# Patient Record
Sex: Female | Born: 1940 | Race: Black or African American | Hispanic: No | Smoking: Former smoker
Health system: Southern US, Community
[De-identification: ages and names within clinical notes are randomized; demographics above are authoritative.]

## PROBLEM LIST (undated history)

## (undated) DIAGNOSIS — C801 Malignant (primary) neoplasm, unspecified: Secondary | ICD-10-CM

## (undated) DIAGNOSIS — F32A Depression, unspecified: Secondary | ICD-10-CM

## (undated) DIAGNOSIS — R7303 Prediabetes: Secondary | ICD-10-CM

## (undated) DIAGNOSIS — K219 Gastro-esophageal reflux disease without esophagitis: Secondary | ICD-10-CM

## (undated) DIAGNOSIS — R0600 Dyspnea, unspecified: Secondary | ICD-10-CM

## (undated) DIAGNOSIS — M1990 Unspecified osteoarthritis, unspecified site: Secondary | ICD-10-CM

## (undated) DIAGNOSIS — C50919 Malignant neoplasm of unspecified site of unspecified female breast: Secondary | ICD-10-CM

## (undated) DIAGNOSIS — F419 Anxiety disorder, unspecified: Secondary | ICD-10-CM

## (undated) HISTORY — PX: ABDOMINAL HYSTERECTOMY: SHX81

## (undated) HISTORY — PX: APPENDECTOMY: SHX54

## (undated) HISTORY — PX: TONSILLECTOMY: SUR1361

---

## 1992-06-08 DIAGNOSIS — C801 Malignant (primary) neoplasm, unspecified: Secondary | ICD-10-CM

## 1992-06-08 HISTORY — DX: Malignant (primary) neoplasm, unspecified: C80.1

## 1995-06-09 HISTORY — PX: MASTECTOMY: SHX3

## 1997-10-16 ENCOUNTER — Other Ambulatory Visit: Admission: RE | Admit: 1997-10-16 | Discharge: 1997-10-16 | Payer: Self-pay | Admitting: Obstetrics and Gynecology

## 1998-10-22 ENCOUNTER — Other Ambulatory Visit: Admission: RE | Admit: 1998-10-22 | Discharge: 1998-10-22 | Payer: Self-pay | Admitting: Obstetrics and Gynecology

## 1999-08-26 ENCOUNTER — Encounter: Payer: Self-pay | Admitting: Obstetrics and Gynecology

## 1999-08-26 ENCOUNTER — Encounter: Admission: RE | Admit: 1999-08-26 | Discharge: 1999-08-26 | Payer: Self-pay | Admitting: Obstetrics and Gynecology

## 1999-10-14 ENCOUNTER — Other Ambulatory Visit: Admission: RE | Admit: 1999-10-14 | Discharge: 1999-10-14 | Payer: Self-pay | Admitting: Family Medicine

## 2000-08-31 ENCOUNTER — Encounter: Admission: RE | Admit: 2000-08-31 | Discharge: 2000-08-31 | Payer: Self-pay | Admitting: Family Medicine

## 2000-08-31 ENCOUNTER — Encounter: Payer: Self-pay | Admitting: Family Medicine

## 2000-11-09 ENCOUNTER — Other Ambulatory Visit: Admission: RE | Admit: 2000-11-09 | Discharge: 2000-11-09 | Payer: Self-pay | Admitting: *Deleted

## 2001-06-13 ENCOUNTER — Encounter: Payer: Self-pay | Admitting: *Deleted

## 2001-06-13 ENCOUNTER — Ambulatory Visit (HOSPITAL_COMMUNITY): Admission: RE | Admit: 2001-06-13 | Discharge: 2001-06-13 | Payer: Self-pay | Admitting: *Deleted

## 2001-06-21 ENCOUNTER — Encounter (INDEPENDENT_AMBULATORY_CARE_PROVIDER_SITE_OTHER): Payer: Self-pay | Admitting: *Deleted

## 2001-06-21 ENCOUNTER — Ambulatory Visit (HOSPITAL_COMMUNITY): Admission: RE | Admit: 2001-06-21 | Discharge: 2001-06-21 | Payer: Self-pay | Admitting: Gastroenterology

## 2001-09-06 ENCOUNTER — Encounter: Payer: Self-pay | Admitting: Family Medicine

## 2001-09-06 ENCOUNTER — Encounter: Admission: RE | Admit: 2001-09-06 | Discharge: 2001-09-06 | Payer: Self-pay | Admitting: Family Medicine

## 2002-02-07 ENCOUNTER — Other Ambulatory Visit: Admission: RE | Admit: 2002-02-07 | Discharge: 2002-02-07 | Payer: Self-pay | Admitting: Family Medicine

## 2002-09-12 ENCOUNTER — Encounter: Payer: Self-pay | Admitting: Family Medicine

## 2002-09-12 ENCOUNTER — Encounter: Admission: RE | Admit: 2002-09-12 | Discharge: 2002-09-12 | Payer: Self-pay | Admitting: Family Medicine

## 2003-02-13 ENCOUNTER — Other Ambulatory Visit: Admission: RE | Admit: 2003-02-13 | Discharge: 2003-02-13 | Payer: Self-pay | Admitting: Family Medicine

## 2003-09-18 ENCOUNTER — Encounter: Admission: RE | Admit: 2003-09-18 | Discharge: 2003-09-18 | Payer: Self-pay | Admitting: Family Medicine

## 2004-02-26 ENCOUNTER — Other Ambulatory Visit: Admission: RE | Admit: 2004-02-26 | Discharge: 2004-02-26 | Payer: Self-pay | Admitting: Family Medicine

## 2004-05-06 ENCOUNTER — Ambulatory Visit (HOSPITAL_COMMUNITY): Admission: RE | Admit: 2004-05-06 | Discharge: 2004-05-06 | Payer: Self-pay | Admitting: Family Medicine

## 2004-06-13 ENCOUNTER — Ambulatory Visit (HOSPITAL_COMMUNITY): Admission: RE | Admit: 2004-06-13 | Discharge: 2004-06-13 | Payer: Self-pay | Admitting: Gastroenterology

## 2004-09-23 ENCOUNTER — Encounter: Admission: RE | Admit: 2004-09-23 | Discharge: 2004-09-23 | Payer: Self-pay | Admitting: Family Medicine

## 2005-03-04 ENCOUNTER — Other Ambulatory Visit: Admission: RE | Admit: 2005-03-04 | Discharge: 2005-03-04 | Payer: Self-pay | Admitting: Family Medicine

## 2005-09-29 ENCOUNTER — Encounter: Admission: RE | Admit: 2005-09-29 | Discharge: 2005-09-29 | Payer: Self-pay | Admitting: Family Medicine

## 2005-10-01 ENCOUNTER — Encounter: Admission: RE | Admit: 2005-10-01 | Discharge: 2005-10-01 | Payer: Self-pay | Admitting: Family Medicine

## 2006-03-16 ENCOUNTER — Other Ambulatory Visit: Admission: RE | Admit: 2006-03-16 | Discharge: 2006-03-16 | Payer: Self-pay | Admitting: Family Medicine

## 2006-08-20 ENCOUNTER — Ambulatory Visit (HOSPITAL_COMMUNITY): Admission: RE | Admit: 2006-08-20 | Discharge: 2006-08-21 | Payer: Self-pay | Admitting: Surgery

## 2006-08-20 ENCOUNTER — Encounter (INDEPENDENT_AMBULATORY_CARE_PROVIDER_SITE_OTHER): Payer: Self-pay | Admitting: Specialist

## 2006-10-05 ENCOUNTER — Encounter: Admission: RE | Admit: 2006-10-05 | Discharge: 2006-10-05 | Payer: Self-pay | Admitting: Dermatology

## 2006-11-03 ENCOUNTER — Encounter (INDEPENDENT_AMBULATORY_CARE_PROVIDER_SITE_OTHER): Payer: Self-pay | Admitting: Gastroenterology

## 2006-11-03 ENCOUNTER — Ambulatory Visit (HOSPITAL_COMMUNITY): Admission: RE | Admit: 2006-11-03 | Discharge: 2006-11-03 | Payer: Self-pay | Admitting: Gastroenterology

## 2007-10-11 ENCOUNTER — Encounter: Admission: RE | Admit: 2007-10-11 | Discharge: 2007-10-11 | Payer: Self-pay | Admitting: Family Medicine

## 2008-03-28 ENCOUNTER — Emergency Department (HOSPITAL_COMMUNITY): Admission: EM | Admit: 2008-03-28 | Discharge: 2008-03-29 | Payer: Self-pay | Admitting: Emergency Medicine

## 2008-10-12 ENCOUNTER — Encounter: Admission: RE | Admit: 2008-10-12 | Discharge: 2008-10-12 | Payer: Self-pay | Admitting: Family Medicine

## 2009-01-14 ENCOUNTER — Encounter: Admission: RE | Admit: 2009-01-14 | Discharge: 2009-01-14 | Payer: Self-pay | Admitting: Family Medicine

## 2009-04-09 ENCOUNTER — Encounter: Admission: RE | Admit: 2009-04-09 | Discharge: 2009-04-09 | Payer: Self-pay | Admitting: Family Medicine

## 2009-10-15 ENCOUNTER — Encounter: Admission: RE | Admit: 2009-10-15 | Discharge: 2009-10-15 | Payer: Self-pay | Admitting: Family Medicine

## 2010-03-26 ENCOUNTER — Other Ambulatory Visit: Admission: RE | Admit: 2010-03-26 | Discharge: 2010-03-26 | Payer: Self-pay | Admitting: Obstetrics and Gynecology

## 2010-06-27 ENCOUNTER — Ambulatory Visit
Admission: RE | Admit: 2010-06-27 | Discharge: 2010-06-27 | Payer: Self-pay | Source: Home / Self Care | Attending: Orthopedic Surgery | Admitting: Orthopedic Surgery

## 2010-06-30 LAB — POCT HEMOGLOBIN-HEMACUE
Hemoglobin: 12.7 g/dL (ref 12.0–15.0)
Hemoglobin: 14.9 g/dL (ref 12.0–15.0)

## 2010-07-04 NOTE — Op Note (Signed)
[name], Kramer              ACCOUNT NO.:  [id number]  MEDICAL RECORD NO.:  [id number]          PATIENT TYPE:  AMB  LOCATION:  DSC                          FACILITY:  [facility]  PHYSICIAN:  Alexis Kramer. Alexis Kramer, M.D. DATE OF BIRTH:  Oct 31, 1940  DATE OF PROCEDURE:  06/27/2010 DATE OF DISCHARGE:                              OPERATIVE REPORT   PREOPERATIVE DIAGNOSIS:  Entrapment neuropathy, median nerve, right carpal tunnel, severe.  POSTOPERATIVE DIAGNOSIS:  Entrapment neuropathy, median nerve, right carpal tunnel, severe.  OPERATIONS:  Release of right transverse carpal ligament.  OPERATIONS:  Alexis Kramer. Alexis Hocevar, MD  ASSISTANT:  Alexis Reeks Dasnoit, PA-C  ANESTHESIA:  General by LMA.  SUPERVISING ANESTHESIOLOGIST:  Alexis Person, MD  INDICATIONS:  Alexis Kramer is a 70 year old woman referred through the courtesy of Dr. Laurann Kramer for evaluation and management of hand numbness.  She had been previously evaluated by Dr. Metro Kramer and had been diagnosed with bilateral carpal tunnel syndrome.  She has had conservative care for her carpal tunnel syndrome without relief.  She has had steroid injections and splinting.  Dr. Naaman Kramer of Alexis Kramer completed detailed electrodiagnostic studies in October 2010 revealing a very significant right carpal tunnel syndrome.  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  This is not likely an etiology of her carpal tunnel syndrome.  Due to failure to respond to nonoperative measures, she presented on November 2, Kramer, requesting care for her carpal tunnel syndrome.  We reviewed the records of Alexis Kramer including Dr. Elberta Kramer detailed electrodiagnostic studies.  These revealed severe right carpal tunnel syndrome.  We provided informed consent from Alexis Kramer including the observation that it would take months for her to have optimum recover sensibility in her hand, perhaps 5-6 months.  After  informed consent, she is brought to the operating room at this time.  Preoperatively, she was interviewed by Dr. Gypsy Kramer.  Due to history of prior left breast cancer treated in 1997, she was very apprehensive about IV access on the left arm despite the absence of baseline lymphedema.  Dr. Gypsy Kramer elected to place an IV at the antecubital level of the right brachium.  We will use an Esmarch bandage as an alternative to a pneumatic tourniquet.  After informed consent, Alexis Kramer is brought to the operating room at this time.  PROCEDURE:  Alexis Kramer was brought to room #1 of the Alexis Kramer and placed in supine position upon the operating table. Following the induction of general anesthesia by LMA technique under Dr. Burnett Kramer direct supervision, the right arm was prepped with Betadine soap and solution, sterilely draped.  After a routine surgical time-out, the arm was exsanguinated with an Esmarch bandage that was left in the proximal forearm as a tourniquet.  Procedure commenced with a short incision in line of the ring finger and the palm.  Subcutaneous tissues were carefully divided revealing the palmar fascia.  This was split longitudinally to reveal the common sensory branch of the median nerve.  These were followed back to the transverse carpal ligament followed by use of a Alexis Kramer #4 elevator to separate the median  nerve and tenosynovium of the ulnar bursa from the deep surface of the transcarpal ligament.  The ligament was then released subcutaneously with scissors extending into the distal forearm. This widely opened the carpal canal.  There was no evidence of hemangioma extending into the carpal canal.  The tourniquet was released and bleeding was controlled by bipolar cautery and direct pressure.  The wound was repaired with intradermal 3-0 Prolene suture.  Compressive dressing was supplied with a volar plaster splint maintaining the wrist in 5 degrees of  dorsiflexion.  There were no apparent complications.  For aftercare, Alexis Kramer was provided a prescription for Percocet 5 mg 1 p.o. q.4-6 h. p.r.n. pain, 20 tablets without refill.     Alexis Kramer Linard Daft, M.D.     RVS/MEDQ  D:  06/27/2010  T:  06/27/2010  Job:  [id number]  cc:   Alexis Kramer, M.D. Alexis Barrios. Alvester Morin, MD  Electronically Signed by Alexis Kramer M.D. on 07/02/2010 08:02:11 AM

## 2010-09-16 ENCOUNTER — Other Ambulatory Visit: Payer: Self-pay | Admitting: Family Medicine

## 2010-09-16 DIAGNOSIS — Z1231 Encounter for screening mammogram for malignant neoplasm of breast: Secondary | ICD-10-CM

## 2010-10-21 ENCOUNTER — Ambulatory Visit
Admission: RE | Admit: 2010-10-21 | Discharge: 2010-10-21 | Disposition: A | Discharge status: Home / Self Care | Payer: Medicare Other | Source: Ambulatory Visit | Attending: Family Medicine | Admitting: Family Medicine

## 2010-10-21 DIAGNOSIS — Z1231 Encounter for screening mammogram for malignant neoplasm of breast: Secondary | ICD-10-CM

## 2010-10-21 NOTE — Op Note (Signed)
NAME:  Alexis Kramer, Alexis Kramer              ACCOUNT NO.:  [id number]   MEDICAL RECORD NO.:  [id number]          PATIENT TYPE:  AMB   LOCATION:  ENDO                         FACILITY:  [facility]   PHYSICIAN:  Anselmo Rod, M.D.  DATE OF BIRTH:  [birth year]   DATE OF PROCEDURE:  11/03/2006  DATE OF DISCHARGE:                               OPERATIVE REPORT   PROCEDURE PERFORMED:  Colonoscopy with snare polypectomy x 1 and cold  biopsies x 4.   ENDOSCOPIST:  Anselmo Rod, M.D.   INSTRUMENT USED:  Pentax video colonoscope.   INDICATIONS FOR PROCEDURE:  A 70 year old [place] American female with a  personal history of breast cancer and rectal bleeding undergoing  colonoscopy to rule out colonic polyps, masses, etc.   PREPROCEDURE PREPARATION:  Informed consent was procured from the  patient. The patient fasted for 8 hours prior to the procedure after  being prepped with Dulcolax pills and a gallon of NuLYTELY the night  prior to the procedure.  Risks and benefits of the procedure including a  10% miss rate of cancer and polyp were discussed with the patient as  well.   PREPROCEDURE PHYSICAL:  VITAL SIGNS:  The patient had stable vital  signs.  NECK:  Supple.  CHEST:  Clear to auscultation  HEART:  S1 and S2 regular.  ABDOMEN:  Soft with normal bowel sounds.   DESCRIPTION OF PROCEDURE:  The patient was placed in left lateral  decubitus position and sedated with an additional 15 mcg of Fentanyl and  2.5 mg of Versed given intravenously in slow incremental doses. Once the  patient was adequately sedated and maintained on low-flow oxygen and  continuous cardiac monitoring, the Pentax video colonoscope was advanced  from the rectum to cecum.  A small sessile polyp was removed by hot  snare from 30 cm (hot snare x1), patchy area of erythema was biopsied  over the IC valve to rule out ischemic colitis.  The terminal ileum  appeared normal.  There was a few scattered diverticula noted.  Retroflexion in the rectum revealed no evidence of hemorrhoids.  The  patient tolerated the procedure well without complication.   IMPRESSION:  1. Small sessile polyp removed by hot snare from 30 cm.  2. Patchy erythema biopsied over the ileocecal valve.  3. Few scattered small diverticula.  4. Normal terminal ileum.   RECOMMENDATIONS:  1. Await pathology results.  2. Avoid all nonsteroidals including aspirin for the next four weeks.  3. Outpatient follow-up in the next two weeks for further      recommendations.      Anselmo Rod, M.D.  Electronically Signed     JNM/MEDQ  D:  11/03/2006  T:  11/03/2006  Job:  [id number]   cc:   Stacie Acres. Cliffton Asters, M.D.

## 2010-10-21 NOTE — Op Note (Signed)
NAME:  Alexis Kramer, Alexis Kramer              ACCOUNT NO.:  [id number]   MEDICAL RECORD NO.:  [id number]          PATIENT TYPE:  AMB   LOCATION:  ENDO                         FACILITY:  [facility]   PHYSICIAN:  Anselmo Rod, M.D.  DATE OF BIRTH:  03/23/1941   DATE OF PROCEDURE:  11/03/2006  DATE OF DISCHARGE:                               OPERATIVE REPORT   PROCEDURE PERFORMED:  Esophagogastroduodenoscopy with multiple cold  biopsies.   ENDOSCOPIST:  Anselmo Rod, M.D.   INSTRUMENT USED:  Pentax video panendoscope.   INDICATIONS FOR PROCEDURE:  70 year old [place] American female with a  history of blood in stool.  Rule out peptic ulcer disease, esophagitis,  etc.   PREPROCEDURE PREPARATION:  Informed consent was procured from the  patient.  The patient fasted for 8 hours prior to the procedure.  Risks,  benefits of the procedure were discussed with the patient in great  detail.   PREPROCEDURE PHYSICAL:  The patient had stable vital signs.  Neck  supple.  Chest clear to auscultation.  S1, S2 regular.  Abdomen soft  with normal bowel sounds.   DESCRIPTION OF PROCEDURE:  The patient was placed in left lateral  decubitus position and sedated with 70 mcg of Fentanyl and 6 mg of  Versed given intravenously in slow incremental doses. Once the patient  was adequately sedated and maintained on low-flow oxygen and continuous  cardiac monitoring, the Pentax video panendoscope was advanced through  the mouthpiece over the tongue into the esophagus under direct vision.  The entire esophagus was widely patent with no evidence of ring,  stricture, mass, esophagitis or Barrett's mucosa. While withdrawing the  scope from the esophagus, when I suctioned the air out there was some  ring appearance of the esophagus but this seemed to resolve with  insufflation of air into the esophagus and the patient denies any  problems with dysphagia and this area was not biopsied.  Diffuse  gastritis was noted.   Antral biopsies were done to rule out presence of  H pylori by pathology. Retroflexion in the high cardia revealed no  evidence of a hiatal hernia.  Small whitish plaque were biopsied from  the proximal small bowel (biopsies times one), duodenitis was noted in  the duodenal bulb.  No ulcer or masses were seen. The patient tolerated  the procedure well without complication. There was no outlet  obstruction.   IMPRESSION:  1. Normal-appearing esophagus.  2. Diffuse gastritis.  Antral biopsies done to rule out H pylori      infection.  3. Duodenitis in the bulb and then whitish plaque biopsied from the      postbulbar area question mucocele.  4. No outlet obstruction noted.   RECOMMENDATIONS:  1. Await pathology results.  2. Avoid all nonsteroidals for aspirin for now.  3. Proceed with a colonoscopy at this time.  4. Further recommendations made thereafter.      Anselmo Rod, M.D.  Electronically Signed     JNM/MEDQ  D:  11/03/2006  T:  11/03/2006  Job:  [id number]   cc:   Stacie Acres. White,  M.D. 

## 2010-10-24 NOTE — Op Note (Signed)
NAME:  Alexis Kramer, Alexis Kramer              ACCOUNT NO.:  [id number]   MEDICAL RECORD NO.:  [id number]          PATIENT TYPE:  AMB   LOCATION:  DAY                          FACILITY:  [facility]   PHYSICIAN:  Sandria Bales. Ezzard Standing, M.D.  DATE OF BIRTH:  [birth year]   DATE OF PROCEDURE:  DATE OF DISCHARGE:                               OPERATIVE REPORT   PREOPERATIVE DIAGNOSIS:  Chronic cholecystitis with cholelithiasis.   POSTOPERATIVE DIAGNOSIS:  Chronic cholecystitis, cholelithiasis with  liver fused to anterior peritoneal wall.   PROCEDURE:  Laparoscopic cholecystectomy with intraoperative  cholangiogram.   SURGEON:  Sandria Bales. Ezzard Standing, M.D.   FIRST ASSISTANT:  Wilmon Arms. Tsuei, M.D.   ANESTHESIA:  General endotracheal.   ESTIMATED BLOOD LOSS:  Minimal.   INDICATIONS FOR PROCEDURE:  Alexis Kramer is a 70 year old white female  patient of Dr. Charlott Rakes who has had symptomatic cholelithiasis.  She  now comes in for attempted laparoscopic cholecystectomy.   The indications and potential complications of the procedure were  explained to the patient.  The potential complications include but are  not limited to bleeding, infection, bile duct injury, and open surgery.  Patient now comes in for attempted laparoscopic cholecystectomy.   OPERATIVE NOTE:  Patient is in a supine position and given a general  endotracheal anesthetic.  Her abdomen is prepped with Betadine solution  and sterilely draped.  The patient had a prior left mastectomy with TRAM  reconstruction.  She has a lower abdominal scar consistent with the  prior TRAM of her abdominal wall.   Her umbilicus had scars around it from its prior surgery.  I made an  incision indirectly below the umbilicus and cut into the abdominal  cavity.  She did have some mesh that made up part of her anterior  abdominal wall.   I got into the abdominal cavity without difficulty and placed a 0 degree  10 mm laparoscope through a 12 mm Hasson trocar and  secured the Hasson  with a 0 Vicryl suture.   I carried out abdominal exploration.  Both right and left lobes of her  liver were fused to her anterior peritoneal cavity.  There really was no  plane at all to take any of these adhesions down.  Otherwise, her  abdominal cavity was unremarkable.  Her stomach and bile duct were  unremarkable.   I placed three additional trocars of 10 mm subxiphoid trocar, a 5 mm  right subcostal followed by a lateral subcostal.  The gallbladder  adhesions on the anterior surface, I took these adhesions down using  hook Bovie electrocautery and got down to the cystic duct/gallbladder  junction.  She actually had a very tiny cystic duct, which was about 2  to 2.5 cm long.  I cleaned this off enough to shoot a cholangiogram.   I shot the intraoperative cholangiogram using a cut-off taut catheter  and inserted it through the 14 gauge Jelco into the side of the cut  cystic duct and secured it with an endoclip.   I injected half-strength Renografin under fluoroscopy, and this showed  free flow down the  cystic duct, into the common bile duct, down to  duodenum, and up the hepatic radicals, and was consistent with a normal  intraoperative cholangiogram.   The taut catheter was then removed.  The cystic duct was triply endo-  clipped and divided.  Patient only had a tiny cystic artery branch that  went behind the triangle of Calot, but I divided this.  The gallbladder  had been sharply and bluntly dissected from the gallbladder bed.  Prior  to completely dividing the gallbladder from the gallbladder bed, I  revisualized  the triangle of Calot.  I revisualized the gallbladder  bed.  There was no bleeding, no bile leak.  I then removed the  gallbladder, placed it in an EndoCatch bag, and delivered it through the  umbilicus.   The area of the abdomen was irrigated with 500 cc of saline.  Again,  this liver edge was stuck up anteriorly.  I am not actually sure  why  this happened, it looked old and chronic scarring.   Each trocar was then removed in turn.  The umbilical trocar was closed  with two different 2-0 Vicryl sutures.  Each wound was closed with 5-0  Monocryl suture, painted with Tincture of Benzoin and Steri-stripped.  Sponge and needle count were correct at the end of the case.   The patient tolerated the procedure well, was transferred to the  recovery room in good condition.      Sandria Bales. Ezzard Standing, M.D.  Electronically Signed     DHN/MEDQ  D:  08/20/2006  T:  08/21/2006  Job:  [id number]   cc:   Stacie Acres. Cliffton Asters, M.D.  Fax: [phone]

## 2010-10-24 NOTE — Op Note (Signed)
NAME:  Alexis Kramer, Alexis Kramer              ACCOUNT NO.:  [id number]   MEDICAL RECORD NO.:  [id number]          PATIENT TYPE:  AMB   LOCATION:  ENDO                         FACILITY:  [facility]   PHYSICIAN:  Anselmo Rod, M.D.  DATE OF BIRTH:  1941/02/04   DATE OF PROCEDURE:  06/13/2004  DATE OF DISCHARGE:                                 OPERATIVE REPORT   PROCEDURE:  Screening colonoscopy.   ENDOSCOPIST:  Anselmo Rod, M.D.   INSTRUMENT USED:  Olympus video colonoscope.   INDICATIONS FOR PROCEDURE:  A 70 year old African-American female with a  personal history of adenomatous polyps and a family history of colon cancer  in several family members undergoing a screening colonoscopy to rule out  colonic polyps, masses, etc.   PREPROCEDURE PREPARATION:  Informed consent was procured from the patient.  The patient fasted for eight hours prior to the procedure and prepped with a  bottle of magnesium citrate and a gallon of GoLYTELY the night prior to the  procedure.   PREPROCEDURE PHYSICAL:  The patient had stable vital signs. Neck supple.  Chest clear to auscultation. S1, S2 regular. Abdomen soft with normal bowel  sounds.   DESCRIPTION OF PROCEDURE:  The patient was placed in the left lateral  decubitus position and sedated with 60 mg of Demerol and 7.5 mg of Versed in  slow incremental doses.  Once the patient was adequately sedated and  maintained on low flow oxygen and continuous cardiac monitoring, the Olympus  video colonoscope was advanced from the rectum to the cecum. The appendiceal  orifice and ileocecal valve were clearly visualized and photographed.  No  masses, polyps, erosions, ulcerations, or diverticula were seen. The  terminal ileum appeared healthy and without lesions. Small internal  hemorrhoids were seen on retroflexion. A small external hemorrhoid was seen  on anal inspection. The patient tolerated the procedure well without  complications.   IMPRESSION:  1.   Normal colonoscopy of the terminal ileum except for small internal      hemorrhoids and a small external hemorrhoid.  2.  No masses, polyps or diverticula seen.   RECOMMENDATIONS:  1.  Continue on high fiber diet with liberal fluid intake.  2.  Repeat colonoscopy in the next five years unless the patient develops      any abnormal symptoms in the interim.  3.  Outpatient followup as the need arises in the future.      Jyot   JNM/MEDQ  D:  06/13/2004  T:  06/13/2004  Job:  [id number]   cc:   Stacie Acres. White, M.D.  [address]  [place]  [place] [address]  Fax: [phone]

## 2010-10-24 NOTE — Procedures (Signed)
[facility]  Patient:    Alexis Kramer, SPADONI Visit Number: [id number] MRN: [id number]          Service Type: END Location: ENDO Attending Physician:  Charna Elizabeth Dictated by:   Anselmo Rod, M.D. Proc. Date: 06/21/01 Admit Date:  06/21/2001   CC:         Stacie Acres. Cliffton Asters, M.D.   Procedure Report  DATE OF BIRTH:  Apr 08, 1941.  PROCEDURE:  Colonoscopy with snare polypectomy x 1.  ENDOSCOPIST:  Anselmo Rod, M.D.  INSTRUMENT USED:  Olympus video colonoscope.  INDICATION FOR PROCEDURE:  Rectal bleeding in a 70 year old African-American female with a personal history of breast cancer.  Rule out colonic polyps, masses, hemorrhoids, etc.  PREPROCEDURE PREPARATION:  Informed consent was procured from the patient. The patient was fasted for eight hours prior to the procedure and prepped with a bottle of magnesium citrate and a gallon of NuLytely the night prior to the procedure.  PREPROCEDURE PHYSICAL:  VITAL SIGNS:  The patient had stable vital signs.  NECK:  Supple.  CHEST:  Clear to auscultation.  S1, S2 regular.  ABDOMEN:  Soft with normal bowel sounds.  DESCRIPTION OF PROCEDURE:  The patient was placed in the left lateral decubitus position and sedated with 50 mg of Demerol and 7.5 mg of Versed intravenously.  Once the patient was adequately sedate and maintained on low-flow oxygen and continuous cardiac monitoring, the Olympus video colonoscope was advanced from the rectum to the cecum without difficulty. Except for a small flat polyp that was removed by snare polypectomy forceps at 50 cm, no other abnormalities were seen.  The polypectomy forceps cut through the polyp before the snare could be completely closed; therefore, there was a small amount of bleeding at the site of the polypectomy.  There was immediate hemostasis, and the appendiceal orifice and the ileocecal valve were clearly visualized and photographed.  No  other abnormalities were noted except for small external hemorrhoids seen on anal inspection when the scope was withdrawn.  There was no evidence of diverticulosis.  IMPRESSION: 1. Small, nonbleeding external hemorrhoids. 2. Small sessile polyp snared at 50 cm. 3. Normal-appearing cecum, normal ileum, transverse, and right colon.  RECOMMENDATIONS: 1. Await pathology results. 2. Avoid all nonsteroidals, including aspirin. 3. Outpatient follow-up on a p.r.n. basis. 4. Repeat colorectal cancer screening depending on the pathology results. Dictated by:   Anselmo Rod, M.D. Attending Physician:  Charna Elizabeth DD:  06/21/01 TD:  06/21/01 Job: [id number]

## 2011-03-10 LAB — URINALYSIS, ROUTINE W REFLEX MICROSCOPIC
Bilirubin Urine: NEGATIVE
Ketones, ur: NEGATIVE
Nitrite: NEGATIVE
Protein, ur: NEGATIVE

## 2011-03-10 LAB — URINE MICROSCOPIC-ADD ON

## 2011-04-01 ENCOUNTER — Other Ambulatory Visit: Payer: Self-pay | Admitting: Obstetrics and Gynecology

## 2011-04-01 ENCOUNTER — Other Ambulatory Visit (HOSPITAL_COMMUNITY)
Admission: RE | Admit: 2011-04-01 | Discharge: 2011-04-01 | Disposition: A | Discharge status: Home / Self Care | Payer: Medicare Other | Source: Ambulatory Visit | Attending: Obstetrics and Gynecology | Admitting: Obstetrics and Gynecology

## 2011-04-01 DIAGNOSIS — Z01419 Encounter for gynecological examination (general) (routine) without abnormal findings: Secondary | ICD-10-CM | POA: Insufficient documentation

## 2011-09-15 ENCOUNTER — Other Ambulatory Visit: Payer: Self-pay | Admitting: Family Medicine

## 2011-09-15 DIAGNOSIS — Z1231 Encounter for screening mammogram for malignant neoplasm of breast: Secondary | ICD-10-CM

## 2011-10-27 ENCOUNTER — Ambulatory Visit
Admission: RE | Admit: 2011-10-27 | Discharge: 2011-10-27 | Disposition: A | Discharge status: Home / Self Care | Payer: Medicare Other | Source: Ambulatory Visit | Attending: Family Medicine | Admitting: Family Medicine

## 2011-10-27 DIAGNOSIS — Z1231 Encounter for screening mammogram for malignant neoplasm of breast: Secondary | ICD-10-CM

## 2012-09-26 ENCOUNTER — Other Ambulatory Visit: Payer: Self-pay

## 2012-09-26 DIAGNOSIS — Z1231 Encounter for screening mammogram for malignant neoplasm of breast: Secondary | ICD-10-CM

## 2012-09-27 ENCOUNTER — Other Ambulatory Visit: Payer: Self-pay | Admitting: Oral Surgery

## 2012-11-01 ENCOUNTER — Ambulatory Visit
Admission: RE | Admit: 2012-11-01 | Discharge: 2012-11-01 | Disposition: A | Discharge status: Home / Self Care | Payer: Medicare Other | Source: Ambulatory Visit

## 2012-11-01 DIAGNOSIS — Z1231 Encounter for screening mammogram for malignant neoplasm of breast: Secondary | ICD-10-CM

## 2013-01-11 ENCOUNTER — Other Ambulatory Visit (HOSPITAL_COMMUNITY)
Admission: RE | Admit: 2013-01-11 | Discharge: 2013-01-11 | Disposition: A | Discharge status: Home / Self Care | Payer: Medicare Other | Source: Ambulatory Visit | Attending: Family Medicine | Admitting: Family Medicine

## 2013-01-11 ENCOUNTER — Other Ambulatory Visit: Payer: Self-pay | Admitting: Family Medicine

## 2013-01-11 DIAGNOSIS — R87619 Unspecified abnormal cytological findings in specimens from cervix uteri: Secondary | ICD-10-CM | POA: Insufficient documentation

## 2013-09-25 ENCOUNTER — Other Ambulatory Visit: Payer: Self-pay

## 2013-09-25 DIAGNOSIS — Z1231 Encounter for screening mammogram for malignant neoplasm of breast: Secondary | ICD-10-CM

## 2013-11-07 ENCOUNTER — Encounter (INDEPENDENT_AMBULATORY_CARE_PROVIDER_SITE_OTHER): Payer: Self-pay

## 2013-11-07 ENCOUNTER — Ambulatory Visit
Admission: RE | Admit: 2013-11-07 | Discharge: 2013-11-07 | Disposition: A | Discharge status: Home / Self Care | Payer: Medicare Other | Source: Ambulatory Visit

## 2013-11-07 ENCOUNTER — Other Ambulatory Visit: Payer: Self-pay

## 2013-11-07 DIAGNOSIS — Z1231 Encounter for screening mammogram for malignant neoplasm of breast: Secondary | ICD-10-CM

## 2014-10-08 ENCOUNTER — Other Ambulatory Visit: Payer: Self-pay

## 2014-10-08 DIAGNOSIS — Z9012 Acquired absence of left breast and nipple: Secondary | ICD-10-CM

## 2014-10-08 DIAGNOSIS — Z1231 Encounter for screening mammogram for malignant neoplasm of breast: Secondary | ICD-10-CM

## 2014-11-13 ENCOUNTER — Ambulatory Visit
Admission: RE | Admit: 2014-11-13 | Discharge: 2014-11-13 | Disposition: A | Discharge status: Home / Self Care | Payer: Medicare Other | Source: Ambulatory Visit

## 2014-11-13 DIAGNOSIS — Z1231 Encounter for screening mammogram for malignant neoplasm of breast: Secondary | ICD-10-CM

## 2014-11-13 DIAGNOSIS — Z9012 Acquired absence of left breast and nipple: Secondary | ICD-10-CM

## 2015-10-04 ENCOUNTER — Other Ambulatory Visit: Payer: Self-pay

## 2015-10-04 DIAGNOSIS — Z1231 Encounter for screening mammogram for malignant neoplasm of breast: Secondary | ICD-10-CM

## 2015-11-20 ENCOUNTER — Other Ambulatory Visit: Payer: Self-pay | Admitting: Family Medicine

## 2015-11-20 ENCOUNTER — Ambulatory Visit
Admission: RE | Admit: 2015-11-20 | Discharge: 2015-11-20 | Disposition: A | Discharge status: Home / Self Care | Payer: Medicare Other | Source: Ambulatory Visit

## 2015-11-20 DIAGNOSIS — Z1231 Encounter for screening mammogram for malignant neoplasm of breast: Secondary | ICD-10-CM

## 2015-11-28 ENCOUNTER — Other Ambulatory Visit: Payer: Self-pay

## 2015-11-28 DIAGNOSIS — N63 Unspecified lump in unspecified breast: Secondary | ICD-10-CM

## 2015-11-28 DIAGNOSIS — Z9012 Acquired absence of left breast and nipple: Secondary | ICD-10-CM

## 2015-11-28 DIAGNOSIS — Z1231 Encounter for screening mammogram for malignant neoplasm of breast: Secondary | ICD-10-CM

## 2015-12-05 ENCOUNTER — Other Ambulatory Visit: Payer: Medicare Other

## 2015-12-06 ENCOUNTER — Ambulatory Visit
Admission: RE | Admit: 2015-12-06 | Discharge: 2015-12-06 | Disposition: A | Discharge status: Home / Self Care | Payer: Medicare Other | Source: Ambulatory Visit

## 2015-12-06 DIAGNOSIS — Z9012 Acquired absence of left breast and nipple: Secondary | ICD-10-CM

## 2015-12-06 DIAGNOSIS — N63 Unspecified lump in unspecified breast: Secondary | ICD-10-CM

## 2016-09-23 ENCOUNTER — Other Ambulatory Visit: Payer: Self-pay | Admitting: Family Medicine

## 2016-09-23 DIAGNOSIS — N63 Unspecified lump in unspecified breast: Secondary | ICD-10-CM

## 2016-09-28 ENCOUNTER — Ambulatory Visit
Admission: RE | Admit: 2016-09-28 | Discharge: 2016-09-28 | Disposition: A | Discharge status: Home / Self Care | Payer: Medicare Other | Source: Ambulatory Visit | Attending: Family Medicine | Admitting: Family Medicine

## 2016-09-28 DIAGNOSIS — N63 Unspecified lump in unspecified breast: Secondary | ICD-10-CM

## 2016-09-28 HISTORY — DX: Malignant neoplasm of unspecified site of unspecified female breast: C50.919

## 2016-09-28 HISTORY — DX: Malignant (primary) neoplasm, unspecified: C80.1

## 2017-06-09 ENCOUNTER — Encounter: Payer: Self-pay | Admitting: Podiatry

## 2017-06-09 ENCOUNTER — Ambulatory Visit (INDEPENDENT_AMBULATORY_CARE_PROVIDER_SITE_OTHER): Payer: Medicare Other

## 2017-06-09 ENCOUNTER — Ambulatory Visit: Payer: Medicare Other | Admitting: Podiatry

## 2017-06-09 DIAGNOSIS — M2011 Hallux valgus (acquired), right foot: Secondary | ICD-10-CM

## 2017-06-09 DIAGNOSIS — M2012 Hallux valgus (acquired), left foot: Secondary | ICD-10-CM

## 2017-06-09 NOTE — Progress Notes (Signed)
Subjective:   Patient ID: Alexis Kramer, female   DOB: 77 y.o.   MRN: [id number]   HPI Patient presents stating she has had painful bunions for a number of years and has tried wider shoes padding and soaks without relief of symptoms and they are gradually becoming more symptomatic and making shoe gear or activity and possible   Review of Systems  All other systems reviewed and are negative.       Objective:  Physical Exam  Constitutional: She appears well-developed and well-nourished.  Cardiovascular: Intact distal pulses.  Pulmonary/Chest: Effort normal.  Musculoskeletal: Normal range of motion.  Neurological: She is alert.  Skin: Skin is warm.  Nursing note and vitals reviewed.   Neurovascular status intact muscle strength was adequate range of motion within normal limits.  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  I recommended structural osteotomy and recommended distal osteotomy with one foot being done at a time.  I educated patient on procedures and she will reappoint for consult and is tentatively scheduled for surgery in the next month  X-rays indicate there is a large elevation of the intermetatarsal angle between the first and second of approximate 15 degrees bilateral with large bone spur formation

## 2017-06-09 NOTE — Patient Instructions (Addendum)
Bunion A bunion is a bump on the base of the big toe that forms when the bones of the big toe joint move out of position. Bunions may be small at first, but they often get larger over time. The can make walking painful. What are the causes? A bunion may be caused by:  Wearing narrow or pointed shoes that force the big toe to press against the other toes.  Abnormal foot development that causes the foot to roll inward (pronate).  Changes in the foot that are caused by certain diseases, such as rheumatoid arthritis and polio.  A foot injury.  What increases the risk? The following factors may make you more likely to develop this condition:  Wearing shoes that squeeze the toes together.  Having certain diseases, such as: ? Rheumatoid arthritis. ? Polio. ? Cerebral palsy.  Having family members who have bunions.  Being born with a foot deformity, such as flat feet or low arches.  Doing activities that put a lot of pressure on the feet, such as ballet dancing.  What are the signs or symptoms? The main symptom of a bunion is a noticeable bump on the big toe. Other symptoms may include:  Pain.  Swelling around the big toe.  Redness and inflammation.  Thick or hardened skin on the big toe or between the toes.  Stiffness or loss of motion in the big toe.  Trouble with walking.  How is this diagnosed? A bunion may be diagnosed based on your symptoms, medical history, and activities. You may have tests, such as:  X-rays. These allow your health care provider to check the position of the bones in your foot and look for damage to your joint. They also help your health care provider to determine the severity of your bunion and the best way to treat it.  Joint aspiration. In this test, a sample of fluid is removed from the toe joint. This test, which may be done if you are in a lot of pain, helps to rule out diseases that cause painful swelling of the joints, such as  arthritis.  How is this treated? There is no cure for a bunion, but treatment can help to prevent a bunion from getting worse. Treatment depends on the severity of your symptoms. Your health care provider may recommend:  Wearing shoes that have a wide toe box.  Using bunion pads to cushion the affected area.  Taping your toes together to keep them in a normal position.  Placing a device inside your shoe (orthotics) to help reduce pressure on your toe joint.  Taking medicine to ease pain, inflammation, and swelling.  Applying heat or ice to the affected area.  Doing stretching exercises.  Surgery to remove scar tissue and move the toes back into their normal position. This treatment is rare.  Follow these instructions at home:  Support your toe joint with proper footwear, shoe padding, or taping as told by your health care provider.  Take over-the-counter and prescription medicines only as told by your health care provider.  If directed, apply ice to the injured area: ? Put ice in a plastic bag. ? Place a towel between your skin and the bag. ? Leave the ice on for 20 minutes, 2-3 times per day.  If directed, apply heat to the affected area before you exercise. Use the heat source that your health care provider recommends, such as a moist heat pack or a heating pad. ? Place a towel between your   skin and the heat source. ? Leave the heat on for 20-30 minutes. ? Remove the heat if your skin turns bright red. This is especially important if you are unable to feel pain, heat, or cold. You may have a greater risk of getting burned.  Do exercises as told by your health care provider.  Keep all follow-up visits as told by your health care provider. Contact a health care provider if:  Your symptoms get worse.  Your symptoms do not improve in 2 weeks. Get help right away if:  You have severe pain and trouble with walking. This information is not intended to replace advice given  to you by your health care provider. Make sure you discuss any questions you have with your health care provider. Document Released: 05/25/2005 Document Revised: 10/31/2015 Document Reviewed: 12/23/2014 Elsevier Interactive Patient Education  2018 [employer]  Pre-Operative Instructions  Congratulations, you have decided to take an important step towards improving your quality of life.  You can be assured that the doctors and staff at [facility] will be with you every step of the way.  Here are some important things you should know:  1. Plan to be at the surgery center/hospital at least 1 (one) hour prior to your scheduled time, unless otherwise directed by the surgical center/hospital staff.  You must have a responsible adult accompany you, remain during the surgery and drive you home.  Make sure you have directions to the surgical center/hospital to ensure you arrive on time. 2. If you are having surgery at [facility] or [facility], you will need a copy of your medical history and physical form from your family physician within one month prior to the date of surgery. We will give you a form for your primary physician to complete.  3. We make every effort to accommodate the date you request for surgery.  However, there are times where surgery dates or times have to be moved.  We will contact you as soon as possible if a change in schedule is required.   4. No aspirin/ibuprofen for one week before surgery.  If you are on aspirin, any non-steroidal anti-inflammatory medications (Mobic, Aleve, Ibuprofen) should not be taken seven (7) days prior to your surgery.  You make take Tylenol for pain prior to surgery.  5. Medications - If you are taking daily heart and blood pressure medications, seizure, reflux, allergy, asthma, anxiety, pain or diabetes medications, make sure you notify the surgery center/hospital before the day of surgery so they can tell you which medications you should  take or avoid the day of surgery. 6. No food or drink after midnight the night before surgery unless directed otherwise by surgical center/hospital staff. 7. No alcoholic beverages 24-hours prior to surgery.  No smoking 24-hours prior or 24-hours after surgery. 8. Wear loose pants or shorts. They should be loose enough to fit over bandages, boots, and casts. 9. Don't wear slip-on shoes. Sneakers are preferred. 10. Bring your boot with you to the surgery center/hospital.  Also bring crutches or a walker if your physician has prescribed it for you.  If you do not have this equipment, it will be provided for you after surgery. 11. If you have not been contacted by the surgery center/hospital by the day before your surgery, call to confirm the date and time of your surgery. 12. Leave-time from work may vary depending on the type of surgery you have.  Appropriate arrangements should be made prior   to surgery with your employer. 13. Prescriptions will be provided immediately following surgery by your doctor.  Fill these as soon as possible after surgery and take the medication as directed. Pain medications will not be refilled on weekends and must be approved by the doctor. 14. Remove nail polish on the operative foot and avoid getting pedicures prior to surgery. 15. Wash the night before surgery.  The night before surgery wash the foot and leg well with water and the antibacterial soap provided. Be sure to pay special attention to beneath the toenails and in between the toes.  Wash for at least three (3) minutes. Rinse thoroughly with water and dry well with a towel.  Perform this wash unless told not to do so by your physician.  Enclosed: 1 Ice pack (please put in freezer the night before surgery)   1 Hibiclens skin cleaner   Pre-op instructions  If you have any questions regarding the instructions, please do not hesitate to call our office.  [place]: [address], , [place] [address] --  [phone]  [place]: [address] [place], [place] [address] -- [phone]  [place]: [address]  [place], [place] [address] -- [phone]  [place]: [address], [address], [place], [place] [address] -- [phone]  Website: [email or web address]  

## 2017-06-09 NOTE — Progress Notes (Signed)
   Subjective:    Patient ID: Alexis Kramer, female    DOB: 06-Jun-1941, 77 y.o.   MRN: [id number]  HPI    Review of Systems  All other systems reviewed and are negative.      Objective:   Physical Exam        Assessment & Plan:

## 2017-06-10 ENCOUNTER — Telehealth: Payer: Self-pay | Admitting: *Deleted

## 2017-06-10 NOTE — Telephone Encounter (Signed)
Left message informing pt I had gotten her message and would inform Dr. Paulla Dolly, and the Assistant Surgery Coordinator - Coralee Pesa would cancel with the surgery center, to please call back with rescheduling information and reason for cancelling.

## 2017-06-10 NOTE — Telephone Encounter (Signed)
Pt called to cancel surgery on 07/06/2017. Pt called 9 minutes later to cancel surgery.

## 2017-06-10 NOTE — Telephone Encounter (Signed)
Pt states her son I having prostate surgery and she wants to see how he does.

## 2017-06-11 NOTE — Telephone Encounter (Signed)
Called Cynthia's direct number and left a voicemail letting her know that the pt is cancelling her surgery for now due to family and if she decides to reschedule we will let her know. Told her to call me back if she had any questions.

## 2017-06-23 ENCOUNTER — Ambulatory Visit: Payer: Medicare Other | Admitting: Podiatry

## 2017-08-20 ENCOUNTER — Other Ambulatory Visit: Payer: Self-pay | Admitting: Family Medicine

## 2017-08-20 DIAGNOSIS — Z1231 Encounter for screening mammogram for malignant neoplasm of breast: Secondary | ICD-10-CM

## 2017-10-06 ENCOUNTER — Ambulatory Visit
Admission: RE | Admit: 2017-10-06 | Discharge: 2017-10-06 | Disposition: A | Discharge status: Home / Self Care | Payer: Medicare Other | Source: Ambulatory Visit | Attending: Family Medicine | Admitting: Family Medicine

## 2017-10-06 ENCOUNTER — Other Ambulatory Visit: Payer: Self-pay | Admitting: Family Medicine

## 2017-10-06 DIAGNOSIS — Z1231 Encounter for screening mammogram for malignant neoplasm of breast: Secondary | ICD-10-CM

## 2018-08-31 ENCOUNTER — Other Ambulatory Visit: Payer: Self-pay | Admitting: Family Medicine

## 2018-08-31 DIAGNOSIS — Z1231 Encounter for screening mammogram for malignant neoplasm of breast: Secondary | ICD-10-CM

## 2018-08-31 DIAGNOSIS — M170 Bilateral primary osteoarthritis of knee: Secondary | ICD-10-CM | POA: Diagnosis not present

## 2018-10-20 ENCOUNTER — Ambulatory Visit
Admission: RE | Admit: 2018-10-20 | Discharge: 2018-10-20 | Disposition: A | Discharge status: Home / Self Care | Payer: Medicare HMO | Source: Ambulatory Visit | Attending: Family Medicine | Admitting: Family Medicine

## 2018-10-20 ENCOUNTER — Other Ambulatory Visit: Payer: Self-pay

## 2018-10-20 DIAGNOSIS — Z1231 Encounter for screening mammogram for malignant neoplasm of breast: Secondary | ICD-10-CM

## 2018-10-26 ENCOUNTER — Ambulatory Visit: Payer: Medicare Other

## 2018-12-26 DIAGNOSIS — M170 Bilateral primary osteoarthritis of knee: Secondary | ICD-10-CM | POA: Diagnosis not present

## 2019-01-25 DIAGNOSIS — F5101 Primary insomnia: Secondary | ICD-10-CM | POA: Diagnosis not present

## 2019-01-25 DIAGNOSIS — F3341 Major depressive disorder, recurrent, in partial remission: Secondary | ICD-10-CM | POA: Diagnosis not present

## 2019-01-25 DIAGNOSIS — F419 Anxiety disorder, unspecified: Secondary | ICD-10-CM | POA: Diagnosis not present

## 2019-01-25 DIAGNOSIS — K219 Gastro-esophageal reflux disease without esophagitis: Secondary | ICD-10-CM | POA: Diagnosis not present

## 2019-01-25 DIAGNOSIS — E785 Hyperlipidemia, unspecified: Secondary | ICD-10-CM | POA: Diagnosis not present

## 2019-02-28 DIAGNOSIS — F5101 Primary insomnia: Secondary | ICD-10-CM | POA: Diagnosis not present

## 2019-02-28 DIAGNOSIS — F419 Anxiety disorder, unspecified: Secondary | ICD-10-CM | POA: Diagnosis not present

## 2019-02-28 DIAGNOSIS — K6289 Other specified diseases of anus and rectum: Secondary | ICD-10-CM | POA: Diagnosis not present

## 2019-03-30 DIAGNOSIS — Z20828 Contact with and (suspected) exposure to other viral communicable diseases: Secondary | ICD-10-CM | POA: Diagnosis not present

## 2019-04-01 DIAGNOSIS — H2513 Age-related nuclear cataract, bilateral: Secondary | ICD-10-CM | POA: Diagnosis not present

## 2019-04-05 DIAGNOSIS — M170 Bilateral primary osteoarthritis of knee: Secondary | ICD-10-CM | POA: Diagnosis not present

## 2019-04-25 DIAGNOSIS — M19071 Primary osteoarthritis, right ankle and foot: Secondary | ICD-10-CM | POA: Diagnosis not present

## 2019-04-25 DIAGNOSIS — M19072 Primary osteoarthritis, left ankle and foot: Secondary | ICD-10-CM | POA: Diagnosis not present

## 2019-04-25 DIAGNOSIS — M8580 Other specified disorders of bone density and structure, unspecified site: Secondary | ICD-10-CM | POA: Diagnosis not present

## 2019-04-25 DIAGNOSIS — Z853 Personal history of malignant neoplasm of breast: Secondary | ICD-10-CM | POA: Diagnosis not present

## 2019-04-25 DIAGNOSIS — F3341 Major depressive disorder, recurrent, in partial remission: Secondary | ICD-10-CM | POA: Diagnosis not present

## 2019-04-25 DIAGNOSIS — E785 Hyperlipidemia, unspecified: Secondary | ICD-10-CM | POA: Diagnosis not present

## 2019-04-25 DIAGNOSIS — F4321 Adjustment disorder with depressed mood: Secondary | ICD-10-CM | POA: Diagnosis not present

## 2019-04-25 DIAGNOSIS — M170 Bilateral primary osteoarthritis of knee: Secondary | ICD-10-CM | POA: Diagnosis not present

## 2019-04-25 DIAGNOSIS — N182 Chronic kidney disease, stage 2 (mild): Secondary | ICD-10-CM | POA: Diagnosis not present

## 2019-05-31 DIAGNOSIS — M8580 Other specified disorders of bone density and structure, unspecified site: Secondary | ICD-10-CM | POA: Diagnosis not present

## 2019-05-31 DIAGNOSIS — F3341 Major depressive disorder, recurrent, in partial remission: Secondary | ICD-10-CM | POA: Diagnosis not present

## 2019-05-31 DIAGNOSIS — F4321 Adjustment disorder with depressed mood: Secondary | ICD-10-CM | POA: Diagnosis not present

## 2019-05-31 DIAGNOSIS — Z853 Personal history of malignant neoplasm of breast: Secondary | ICD-10-CM | POA: Diagnosis not present

## 2019-05-31 DIAGNOSIS — M170 Bilateral primary osteoarthritis of knee: Secondary | ICD-10-CM | POA: Diagnosis not present

## 2019-05-31 DIAGNOSIS — E785 Hyperlipidemia, unspecified: Secondary | ICD-10-CM | POA: Diagnosis not present

## 2019-05-31 DIAGNOSIS — M19072 Primary osteoarthritis, left ankle and foot: Secondary | ICD-10-CM | POA: Diagnosis not present

## 2019-05-31 DIAGNOSIS — M19071 Primary osteoarthritis, right ankle and foot: Secondary | ICD-10-CM | POA: Diagnosis not present

## 2019-05-31 DIAGNOSIS — N182 Chronic kidney disease, stage 2 (mild): Secondary | ICD-10-CM | POA: Diagnosis not present

## 2019-06-09 HISTORY — PX: EYE SURGERY: SHX253

## 2019-06-14 DIAGNOSIS — M170 Bilateral primary osteoarthritis of knee: Secondary | ICD-10-CM | POA: Diagnosis not present

## 2019-06-21 DIAGNOSIS — H2513 Age-related nuclear cataract, bilateral: Secondary | ICD-10-CM | POA: Diagnosis not present

## 2019-06-21 DIAGNOSIS — H43393 Other vitreous opacities, bilateral: Secondary | ICD-10-CM | POA: Diagnosis not present

## 2019-06-27 DIAGNOSIS — H25043 Posterior subcapsular polar age-related cataract, bilateral: Secondary | ICD-10-CM | POA: Diagnosis not present

## 2019-06-27 DIAGNOSIS — H25013 Cortical age-related cataract, bilateral: Secondary | ICD-10-CM | POA: Diagnosis not present

## 2019-06-27 DIAGNOSIS — H2513 Age-related nuclear cataract, bilateral: Secondary | ICD-10-CM | POA: Diagnosis not present

## 2019-06-27 DIAGNOSIS — H18413 Arcus senilis, bilateral: Secondary | ICD-10-CM | POA: Diagnosis not present

## 2019-06-27 DIAGNOSIS — H2512 Age-related nuclear cataract, left eye: Secondary | ICD-10-CM | POA: Diagnosis not present

## 2019-07-07 DIAGNOSIS — H25011 Cortical age-related cataract, right eye: Secondary | ICD-10-CM | POA: Diagnosis not present

## 2019-07-07 DIAGNOSIS — H2512 Age-related nuclear cataract, left eye: Secondary | ICD-10-CM | POA: Diagnosis not present

## 2019-07-07 DIAGNOSIS — H2511 Age-related nuclear cataract, right eye: Secondary | ICD-10-CM | POA: Diagnosis not present

## 2019-07-07 DIAGNOSIS — H25041 Posterior subcapsular polar age-related cataract, right eye: Secondary | ICD-10-CM | POA: Diagnosis not present

## 2019-08-14 ENCOUNTER — Other Ambulatory Visit: Payer: Self-pay | Admitting: Orthopaedic Surgery

## 2019-09-01 NOTE — Patient Instructions (Signed)
DUE TO COVID-19 ONLY TWO VISITORS ARE ALLOWED TO COME WITH YOU AND STAY IN THE WAITING ROOM ONLY DURING PRE OP AND PROCEDURE DAY OF SURGERY. THE 2 VISITORS MAY VISIT WITH YOU AFTER SURGERY IN YOUR PRIVATE ROOM DURING VISITING HOURS ONLY!  YOU NEED TO HAVE A COVID 19 TEST ON:09/08/19  @       , THIS TEST MUST BE DONE BEFORE SURGERY, COME  [place], [place]  , [address].  ([place]) ONCE YOUR COVID TEST IS COMPLETED, PLEASE BEGIN THE QUARANTINE INSTRUCTIONS AS OUTLINED IN YOUR HANDOUT.                Alexis Kramer    Your procedure is scheduled on: 09/12/19   Report to [facility] Main  Entrance   Report to SHORT STAY  At: 5:30 AM     Call this number if you have problems the morning of surgery [phone]    Remember:   [place], NO CHEWING GUM [place].     Take these medicines the morning of surgery with A SIP OF WATER: SERTRALINE.ULTRAM AS NEEDED.                                 You may not have any metal on your body including hair pins and              piercings  Do not wear jewelry, make-up, lotions, powders or perfumes, deodorant             Do not wear nail polish on your fingernails.  Do not shave  48 hours prior to surgery.               Do not bring valuables to the hospital. [place].  Contacts, dentures or bridgework may not be worn into surgery.  Leave suitcase in the car. After surgery it may be brought to your room.     Patients discharged the day of surgery will not be allowed to drive home. IF YOU ARE HAVING SURGERY AND GOING HOME THE SAME DAY, YOU MUST HAVE AN ADULT TO DRIVE YOU HOME AND BE WITH YOU FOR 24 HOURS. YOU MAY GO HOME BY TAXI OR UBER OR ORTHERWISE, BUT AN ADULT MUST ACCOMPANY YOU HOME AND STAY WITH YOU FOR 24 HOURS.  Name and phone number of your driver:  Special Instructions: N/A              Please read  over the following fact sheets you were given: _____________________________________________________________________             NO SOLID FOOD AFTER MIDNIGHT THE NIGHT PRIOR TO SURGERY. NOTHING BY MOUTH EXCEPT CLEAR LIQUIDS UNTIL: 4:30 AM . PLEASE FINISH ENSURE DRINK PER SURGEON ORDER  WHICH NEEDS TO BE COMPLETED AT: 4:30 AM .   CLEAR LIQUID DIET   Foods Allowed  Foods Excluded  Coffee and tea, regular and decaf                             liquids that you cannot  Plain Jell-O any favor except red or purple                                           see through such as: Fruit ices (not with fruit pulp)                                     milk, soups, orange juice  Iced Popsicles                                    All solid food Carbonated beverages, regular and diet                                    Cranberry, grape and apple juices Sports drinks like Gatorade Lightly seasoned clear broth or consume(fat free) Sugar, honey syrup  Sample Menu Breakfast                                Lunch                                     Supper Cranberry juice                    Beef broth                            Chicken broth Jell-O                                     Grape juice                           Apple juice Coffee or tea                        Jell-O                                      Popsicle                                                Coffee or tea                        Coffee or tea  _____________________________________________________________________  [facility] - Preparing for Surgery Before surgery, you can play an important role.  Because skin is not sterile, your skin  needs to be as free of germs as possible.  You can reduce the number of germs on your skin by washing with CHG (chlorahexidine gluconate) soap before surgery.  CHG is an antiseptic cleaner which kills germs and bonds with the skin to  continue killing germs even after washing. Please DO NOT use if you have an allergy to CHG or antibacterial soaps.  If your skin becomes reddened/irritated stop using the CHG and inform your nurse when you arrive at Short Stay. Do not shave (including legs and underarms) for at least 48 hours prior to the first CHG shower.  You may shave your face/neck. Please follow these instructions carefully:  1.  Shower with CHG Soap the night before surgery and the  morning of Surgery.  2.  If you choose to wash your hair, wash your hair first as usual with your  normal  shampoo.  3.  After you shampoo, rinse your hair and body thoroughly to remove the  shampoo.                           4.  Use CHG as you would any other liquid soap.  You can apply chg directly  to the skin and wash                       Gently with a scrungie or clean washcloth.  5.  Apply the CHG Soap to your body ONLY FROM THE NECK DOWN.   Do not use on face/ open                           Wound or open sores. Avoid contact with eyes, ears mouth and genitals (private parts).                       Wash face,  Genitals (private parts) with your normal soap.             6.  Wash thoroughly, paying special attention to the area where your surgery  will be performed.  7.  Thoroughly rinse your body with warm water from the neck down.  8.  DO NOT shower/wash with your normal soap after using and rinsing off  the CHG Soap.                9.  Pat yourself dry with a clean towel.            10.  Wear clean pajamas.            11.  Place clean sheets on your bed the night of your first shower and do not  sleep with pets. Day of Surgery : Do not apply any lotions/deodorants the morning of surgery.  Please wear clean clothes to the hospital/surgery center.  FAILURE TO FOLLOW THESE INSTRUCTIONS MAY RESULT IN THE CANCELLATION OF YOUR SURGERY PATIENT SIGNATURE_________________________________  NURSE  SIGNATURE__________________________________  ________________________________________________________________________   Alexis Kramer  An incentive spirometer is a tool that can help keep your lungs clear and active. This tool measures how well you are filling your lungs with each breath. Taking long deep breaths may help reverse or decrease the chance of developing breathing (pulmonary) problems (especially infection) following:  A long period of time when you are unable to move or be active. BEFORE THE PROCEDURE   If the spirometer includes an indicator to  show your best effort, your nurse or respiratory therapist will set it to a desired goal.  If possible, sit up straight or lean slightly forward. Try not to slouch.  Hold the incentive spirometer in an upright position. INSTRUCTIONS FOR USE  1. Sit on the edge of your bed if possible, or sit up as far as you can in bed or on a chair. 2. Hold the incentive spirometer in an upright position. 3. Breathe out normally. 4. Place the mouthpiece in your mouth and seal your lips tightly around it. 5. Breathe in slowly and as deeply as possible, raising the piston or the ball toward the top of the column. 6. Hold your breath for 3-5 seconds or for as long as possible. Allow the piston or ball to fall to the bottom of the column. 7. Remove the mouthpiece from your mouth and breathe out normally. 8. Rest for a few seconds and repeat Steps 1 through 7 at least 10 times every 1-2 hours when you are awake. Take your time and take a few normal breaths between deep breaths. 9. The spirometer may include an indicator to show your best effort. Use the indicator as a goal to work toward during each repetition. 10. After each set of 10 deep breaths, practice coughing to be sure your lungs are clear. If you have an incision (the cut made at the time of surgery), support your incision when coughing by placing a pillow or rolled up towels firmly  against it. Once you are able to get out of bed, walk around indoors and cough well. You may stop using the incentive spirometer when instructed by your caregiver.  RISKS AND COMPLICATIONS  Take your time so you do not get dizzy or light-headed.  If you are in pain, you may need to take or ask for pain medication before doing incentive spirometry. It is harder to take a deep breath if you are having pain. AFTER USE  Rest and breathe slowly and easily.  It can be helpful to keep track of a log of your progress. Your caregiver can provide you with a simple table to help with this. If you are using the spirometer at home, follow these instructions: [place] IF:   You are having difficultly using the spirometer.  You have trouble using the spirometer as often as instructed.  Your pain medication is not giving enough relief while using the spirometer.  You develop fever of 100.5 F (38.1 C) or higher. SEEK IMMEDIATE MEDICAL CARE IF:   You cough up bloody sputum that had not been present before.  You develop fever of 102 F (38.9 C) or greater.  You develop worsening pain at or near the incision site. MAKE SURE YOU:   Understand these instructions.  Will watch your condition.  Will get help right away if you are not doing well or get worse. Document Released: 10/05/2006 Document Revised: 08/17/2011 Document Reviewed: 12/06/2006 [facility] Patient Information 2014 [place], [place].   ________________________________________________________________________

## 2019-09-01 NOTE — Care Plan (Signed)
Ortho Bundle Case Management Note  Patient Details  Name: Alexis Kramer MRN: [id number] Date of Birth: January 17, 1941   spoke with patient prior to surgery. She will discharge to home with husband to assist. Rolling walker and 3n1 ordered. HHPT referral to [facility]. OPPT set up at [facility].  Patient and MD in agreement with plan. Choice offered.                   DME Arranged:  Walker rolling, Bedside commode DME Agency:  [employer]  HH Arranged:  PT [place] Agency:  [facility]  Additional Comments: Please contact me with any questions of if this plan should need to change.  Ladell Heads,  [place] [facility]ist  [phone] 09/01/2019, 12:18 PM

## 2019-09-04 ENCOUNTER — Encounter (HOSPITAL_COMMUNITY)
Admission: RE | Admit: 2019-09-04 | Discharge: 2019-09-04 | Disposition: A | Discharge status: Home / Self Care | Payer: Medicare Other | Source: Ambulatory Visit | Attending: Orthopaedic Surgery | Admitting: Orthopaedic Surgery

## 2019-09-04 ENCOUNTER — Encounter (HOSPITAL_COMMUNITY): Payer: Self-pay

## 2019-09-04 ENCOUNTER — Ambulatory Visit (HOSPITAL_COMMUNITY)
Admission: RE | Admit: 2019-09-04 | Discharge: 2019-09-04 | Disposition: A | Discharge status: Home / Self Care | Payer: Medicare Other | Source: Ambulatory Visit | Attending: Orthopaedic Surgery | Admitting: Orthopaedic Surgery

## 2019-09-04 ENCOUNTER — Other Ambulatory Visit: Payer: Self-pay

## 2019-09-04 DIAGNOSIS — Z01818 Encounter for other preprocedural examination: Secondary | ICD-10-CM | POA: Diagnosis present

## 2019-09-04 LAB — BASIC METABOLIC PANEL
Anion gap: 9 (ref 5–15)
BUN: 15 mg/dL (ref 8–23)
CO2: 26 mmol/L (ref 22–32)
Calcium: 9.4 mg/dL (ref 8.9–10.3)
Chloride: 109 mmol/L (ref 98–111)
Creatinine, Ser: 0.85 mg/dL (ref 0.44–1.00)
GFR calc Af Amer: >60 mL/min (ref >60)
GFR calc non Af Amer: >60 mL/min (ref >60)
Glucose, Bld: 92 mg/dL (ref 70–99)
Potassium: 4 mmol/L (ref 3.5–5.1)
Sodium: 144 mmol/L (ref 135–145)

## 2019-09-04 LAB — URINALYSIS, ROUTINE W REFLEX MICROSCOPIC
Bilirubin Urine: NEGATIVE
Glucose, UA: NEGATIVE mg/dL
Ketones, ur: NEGATIVE mg/dL
Leukocytes,Ua: NEGATIVE
Nitrite: NEGATIVE
Protein, ur: NEGATIVE mg/dL
Specific Gravity, Urine: 1.021 (ref 1.005–1.030)
pH: 5 (ref 5.0–8.0)

## 2019-09-04 LAB — CBC WITH DIFFERENTIAL/PLATELET
Abs Immature Granulocytes: 0.02 10*3/uL (ref 0.00–0.07)
Basophils Absolute: 0.1 10*3/uL (ref 0.0–0.1)
Basophils Relative: 1 %
Eosinophils Absolute: 0.2 10*3/uL (ref 0.0–0.5)
Eosinophils Relative: 2 %
HCT: 46.6 % — ABNORMAL HIGH (ref 36.0–46.0)
Hemoglobin: 14.9 g/dL (ref 12.0–15.0)
Immature Granulocytes: 0 %
Lymphocytes Relative: 37 %
Lymphs Abs: 3.3 10*3/uL (ref 0.7–4.0)
MCH: 31.6 pg (ref 26.0–34.0)
MCHC: 32 g/dL (ref 30.0–36.0)
MCV: 98.9 fL (ref 80.0–100.0)
Monocytes Absolute: 0.6 10*3/uL (ref 0.1–1.0)
Monocytes Relative: 7 %
Neutro Abs: 4.7 10*3/uL (ref 1.7–7.7)
Neutrophils Relative %: 53 %
Platelets: 221 10*3/uL (ref 150–400)
RBC: 4.71 MIL/uL (ref 3.87–5.11)
RDW: 14.8 % (ref 11.5–15.5)
WBC: 8.9 10*3/uL (ref 4.0–10.5)
nRBC: 0 % (ref 0.0–0.2)

## 2019-09-04 LAB — PROTIME-INR
INR: 1 (ref 0.8–1.2)
Prothrombin Time: 13.5 seconds (ref 11.4–15.2)

## 2019-09-04 LAB — APTT: aPTT: 31 seconds (ref 24–36)

## 2019-09-04 LAB — SURGICAL PCR SCREEN
MRSA, PCR: NEGATIVE
Staphylococcus aureus: NEGATIVE

## 2019-09-04 LAB — ABO/RH: ABO/RH(D): O POS

## 2019-09-04 NOTE — Progress Notes (Signed)
PCP - Dr. Dema Severin. Cardiologist -   Chest x-ray -  EKG -  Stress Test -  ECHO -  Cardiac Cath -   Sleep Study -  CPAP -   Fasting Blood Sugar -  Checks Blood Sugar _____ times a day  Blood Thinner Instructions: Aspirin Instructions: Last Dose:  Anesthesia review:   Patient denies shortness of breath, fever, cough and chest pain at PAT appointment   Patient verbalized understanding of instructions that were given to them at the PAT appointment. Patient was also instructed that they will need to review over the PAT instructions again at home before surgery.

## 2019-09-08 ENCOUNTER — Other Ambulatory Visit (HOSPITAL_COMMUNITY)
Admission: RE | Admit: 2019-09-08 | Discharge: 2019-09-08 | Disposition: A | Discharge status: Home / Self Care | Payer: Medicare Other | Source: Ambulatory Visit | Attending: Orthopaedic Surgery | Admitting: Orthopaedic Surgery

## 2019-09-08 DIAGNOSIS — Z20822 Contact with and (suspected) exposure to covid-19: Secondary | ICD-10-CM | POA: Diagnosis not present

## 2019-09-08 DIAGNOSIS — Z01812 Encounter for preprocedural laboratory examination: Secondary | ICD-10-CM | POA: Insufficient documentation

## 2019-09-08 LAB — SARS CORONAVIRUS 2 (TAT 6-24 HRS): SARS Coronavirus 2: NEGATIVE

## 2019-09-11 MED ORDER — TRANEXAMIC ACID 1000 MG/10ML IV SOLN
2000.0000 mg | INTRAVENOUS | Status: AC
  Administered 2019-09-12: 09:00:00 1000 mg via TOPICAL
  Filled 2019-09-11: qty 20

## 2019-09-11 MED ORDER — BUPIVACAINE LIPOSOME 1.3 % IJ SUSP
20.0000 mL | Freq: Once | INTRAMUSCULAR | Status: DC
  Filled 2019-09-11: qty 20

## 2019-09-11 NOTE — H&P (Signed)
TOTAL KNEE ADMISSION H&P  Patient is being admitted for right total knee arthroplasty.  Subjective:  Chief Complaint:right knee pain.  HPI: Alexis Kramer, 79 y.o. female, has a history of pain and functional disability in the right knee due to arthritis and has failed non-surgical conservative treatments for greater than 12 weeks to includeNSAID's and/or analgesics, corticosteriod injections, viscosupplementation injections, flexibility and strengthening excercises, supervised PT with diminished ADL's post treatment, use of assistive devices, weight reduction as appropriate and activity modification.  Onset of symptoms was gradual, starting 5 years ago with gradually worsening course since that time. The patient noted prior procedures on the knee to include  arthroscopy on the right knee(s).  Patient currently rates pain in the right knee(s) at 10 out of 10 with activity. Patient has night pain, worsening of pain with activity and weight bearing, pain that interferes with activities of daily living, crepitus and joint swelling.  Patient has evidence of subchondral cysts, subchondral sclerosis, periarticular osteophytes and joint space narrowing by imaging studies. There is no active infection.  There are no problems to display for this patient.  Past Medical History:  Diagnosis Date  . Breast cancer ([facility])   . Cancer [facility])     Past Surgical History:  Procedure Laterality Date  . ABDOMINAL HYSTERECTOMY    . APPENDECTOMY    . MASTECTOMY Left 1997  . TONSILLECTOMY      Current Facility-Administered Medications  Medication Dose Route Frequency Provider Last Rate Last Admin  . [START ON 09/12/2019] bupivacaine liposome (EXPAREL) 1.3 % injection 266 mg  20 mL Other Once Lenis Noon, [facility]      . [START ON 09/12/2019] tranexamic acid (CYKLOKAPRON) 2,000 mg in sodium chloride 0.9 % 50 mL Topical Application  [id number] mg Topical To OR Lenis Noon, [facility]       Current Outpatient Medications   Medication Sig Dispense Refill Last Dose  . cephALEXin (KEFLEX) 500 MG capsule Take 500 mg by mouth 2 (two) times daily.     . pravastatin (PRAVACHOL) 80 MG tablet Take 80 mg by mouth daily.     . sertraline (ZOLOFT) 50 MG tablet Take 50 mg by mouth 3 (three) times a week.     . traMADol (ULTRAM) 50 MG tablet Take 50 mg by mouth daily as needed for pain.     . traZODone (DESYREL) 100 MG tablet Take 100 mg by mouth at bedtime.      No Known Allergies  Social History   Tobacco Use  . Smoking status: Never Smoker  . Smokeless tobacco: Never Used  Substance Use Topics  . Alcohol use: Never    Family History  Problem Relation Age of Onset  . Breast cancer Sister 77  . Breast cancer Sister      Review of Systems  Musculoskeletal: Positive for arthralgias.       Right knee  All other systems reviewed and are negative.   Objective:  Physical Exam  Constitutional: She is oriented to person, place, and time. She appears well-developed and well-nourished.  HENT:  Head: Normocephalic and atraumatic.  Eyes: Pupils are equal, round, and reactive to light. Conjunctivae are normal.  Cardiovascular: Normal rate and regular rhythm.  Respiratory: Effort normal.  GI: Soft.  Musculoskeletal:     Cervical back: Normal range of motion.     Comments: Examination of the right knee shows range of motion from about 5-95 of flexion.  No real effusion.  She has tenderness to palpation  mostly medially and in the patellofemoral joint.  She is otherwise nontender to palpation throughout.  Her ligaments are stable.  Her calf is soft and nontender.  Hip range of motion on the right is full without pain.  She is neurovascularly intact distally.  Examination left knee shows range of motion from 0-115 of flexion.  No effusion.  She has tenderness to palpation mostly medially.  Her ligaments are stable.  Her calf is soft and nontender.  Left hip range of motion is full without pain.  Neurological: She is  alert and oriented to person, place, and time.  Skin: Skin is warm and dry.  Psychiatric: She has a normal mood and affect. Her behavior is normal. Judgment and thought content normal.    Vital signs in last 24 hours:    Labs:   Estimated body mass index is 26.06 kg/m as calculated from the following:   Height as of 09/04/19: 5' (1.524 m).   Weight as of 09/04/19: 60.5 kg.   Imaging Review Plain radiographs demonstrate severe degenerative joint disease of the right knee(s). The overall alignment isneutral. The bone quality appears to be good for age and reported activity level.      Assessment/Plan:  End stage primary arthritis, right knee   The patient history, physical examination, clinical judgment of the provider and imaging studies are consistent with end stage degenerative joint disease of the right knee(s) and total knee arthroplasty is deemed medically necessary. The treatment options including medical management, injection therapy arthroscopy and arthroplasty were discussed at length. The risks and benefits of total knee arthroplasty were presented and reviewed. The risks due to aseptic loosening, infection, stiffness, patella tracking problems, thromboembolic complications and other imponderables were discussed. The patient acknowledged the explanation, agreed to proceed with the plan and consent was signed. Patient is being admitted for inpatient treatment for surgery, pain control, PT, OT, prophylactic antibiotics, VTE prophylaxis, progressive ambulation and ADL's and discharge planning. The patient is planning to be discharged home with home health services   Patient's anticipated LOS is less than 2 midnights, meeting these requirements: - Younger than 75 - Lives within 1 hour of care - Has a competent adult at home to recover with post-op recover - NO history of  - Chronic pain requiring opiods  - Diabetes  - Coronary Artery Disease  - Heart failure  - Heart  attack  - Stroke  - DVT/VTE  - Cardiac arrhythmia  - Respiratory Failure/COPD  - Renal failure  - Anemia  - Advanced Liver disease

## 2019-09-12 ENCOUNTER — Ambulatory Visit (HOSPITAL_COMMUNITY): Payer: Medicare Other | Admitting: Certified Registered"

## 2019-09-12 ENCOUNTER — Observation Stay (HOSPITAL_COMMUNITY)
Admission: RE | Admit: 2019-09-12 | Discharge: 2019-09-13 | Disposition: A | Discharge status: Home / Self Care | Payer: Medicare Other | Attending: Orthopaedic Surgery | Admitting: Orthopaedic Surgery

## 2019-09-12 ENCOUNTER — Ambulatory Visit (HOSPITAL_COMMUNITY): Payer: Medicare Other | Admitting: Physician Assistant

## 2019-09-12 ENCOUNTER — Encounter (HOSPITAL_COMMUNITY): Payer: Self-pay | Admitting: Orthopaedic Surgery

## 2019-09-12 ENCOUNTER — Encounter (HOSPITAL_COMMUNITY)
Admission: RE | Disposition: A | Discharge status: Home / Self Care | Payer: Self-pay | Source: Home / Self Care | Attending: Orthopaedic Surgery

## 2019-09-12 ENCOUNTER — Other Ambulatory Visit: Payer: Self-pay

## 2019-09-12 DIAGNOSIS — Z853 Personal history of malignant neoplasm of breast: Secondary | ICD-10-CM | POA: Insufficient documentation

## 2019-09-12 DIAGNOSIS — Z79899 Other long term (current) drug therapy: Secondary | ICD-10-CM | POA: Insufficient documentation

## 2019-09-12 DIAGNOSIS — M1711 Unilateral primary osteoarthritis, right knee: Principal | ICD-10-CM | POA: Insufficient documentation

## 2019-09-12 DIAGNOSIS — E785 Hyperlipidemia, unspecified: Secondary | ICD-10-CM | POA: Diagnosis not present

## 2019-09-12 HISTORY — PX: TOTAL KNEE ARTHROPLASTY: SHX125

## 2019-09-12 LAB — TYPE AND SCREEN
ABO/RH(D): O POS
Antibody Screen: NEGATIVE

## 2019-09-12 SURGERY — ARTHROPLASTY, KNEE, TOTAL
Anesthesia: Spinal | Site: Knee | Laterality: Right

## 2019-09-12 MED ORDER — BUPIVACAINE-EPINEPHRINE (PF) 0.5% -1:200000 IJ SOLN
INTRAMUSCULAR | Status: AC
  Filled 2019-09-12: qty 30

## 2019-09-12 MED ORDER — TRANEXAMIC ACID-NACL 1000-0.7 MG/100ML-% IV SOLN
1000.0000 mg | Freq: Once | INTRAVENOUS | Status: AC
  Administered 2019-09-12: 1000 mg via INTRAVENOUS
  Filled 2019-09-12: qty 100

## 2019-09-12 MED ORDER — POVIDONE-IODINE 10 % EX SWAB
2.0000 "application " | Freq: Once | CUTANEOUS | Status: AC
  Administered 2019-09-12: 2 via TOPICAL

## 2019-09-12 MED ORDER — BUPIVACAINE LIPOSOME 1.3 % IJ SUSP
INTRAMUSCULAR | Status: DC | PRN
  Administered 2019-09-12: 20 mL

## 2019-09-12 MED ORDER — EPHEDRINE 5 MG/ML INJ
INTRAVENOUS | Status: AC
  Filled 2019-09-12: qty 10

## 2019-09-12 MED ORDER — TRANEXAMIC ACID-NACL 1000-0.7 MG/100ML-% IV SOLN
INTRAVENOUS | Status: AC
  Filled 2019-09-12: qty 100

## 2019-09-12 MED ORDER — FENTANYL CITRATE (PF) 100 MCG/2ML IJ SOLN
INTRAMUSCULAR | Status: AC
  Filled 2019-09-12: qty 2

## 2019-09-12 MED ORDER — METHOCARBAMOL 500 MG IVPB - SIMPLE MED
500.0000 mg | Freq: Four times a day (QID) | INTRAVENOUS | Status: DC | PRN
  Administered 2019-09-12: 500 mg via INTRAVENOUS
  Filled 2019-09-12: qty 50

## 2019-09-12 MED ORDER — SODIUM CHLORIDE (PF) 0.9 % IJ SOLN
INTRAMUSCULAR | Status: AC
  Filled 2019-09-12: qty 50

## 2019-09-12 MED ORDER — METOCLOPRAMIDE HCL 5 MG PO TABS: 5.0000 mg | ORAL_TABLET | Freq: Three times a day (TID) | ORAL | Status: DC | PRN

## 2019-09-12 MED ORDER — 0.9 % SODIUM CHLORIDE (POUR BTL) OPTIME
TOPICAL | Status: DC | PRN
  Administered 2019-09-12: 1000 mL

## 2019-09-12 MED ORDER — ACETAMINOPHEN 325 MG PO TABS: 325.0000 mg | ORAL_TABLET | Freq: Four times a day (QID) | ORAL | Status: DC | PRN

## 2019-09-12 MED ORDER — LACTATED RINGERS IV SOLN: INTRAVENOUS | Status: DC

## 2019-09-12 MED ORDER — ASPIRIN 81 MG PO CHEW
81.0000 mg | CHEWABLE_TABLET | Freq: Two times a day (BID) | ORAL | Status: DC
  Administered 2019-09-13: 09:00:00 81 mg via ORAL
  Filled 2019-09-12: qty 1

## 2019-09-12 MED ORDER — FENTANYL CITRATE (PF) 100 MCG/2ML IJ SOLN
INTRAMUSCULAR | Status: DC | PRN
  Administered 2019-09-12 (×2): 25 ug via INTRAVENOUS
  Administered 2019-09-12: 50 ug via INTRAVENOUS

## 2019-09-12 MED ORDER — MENTHOL 3 MG MT LOZG: 1.0000 | LOZENGE | OROMUCOSAL | Status: DC | PRN

## 2019-09-12 MED ORDER — PROPOFOL 10 MG/ML IV BOLUS
INTRAVENOUS | Status: DC | PRN
  Administered 2019-09-12: 20 mg via INTRAVENOUS

## 2019-09-12 MED ORDER — DEXAMETHASONE SODIUM PHOSPHATE 10 MG/ML IJ SOLN
INTRAMUSCULAR | Status: DC | PRN
  Administered 2019-09-12: 4 mg via INTRAVENOUS

## 2019-09-12 MED ORDER — ALUM & MAG HYDROXIDE-SIMETH 200-200-20 MG/5ML PO SUSP: 30.0000 mL | ORAL | Status: DC | PRN

## 2019-09-12 MED ORDER — SODIUM CHLORIDE (PF) 0.9 % IJ SOLN
INTRAMUSCULAR | Status: DC | PRN
  Administered 2019-09-12: 30 mL

## 2019-09-12 MED ORDER — CEFAZOLIN SODIUM-DEXTROSE 2-4 GM/100ML-% IV SOLN
2.0000 g | Freq: Four times a day (QID) | INTRAVENOUS | Status: AC
  Administered 2019-09-12 (×2): 2 g via INTRAVENOUS
  Filled 2019-09-12 (×2): qty 100

## 2019-09-12 MED ORDER — CHLORHEXIDINE GLUCONATE 4 % EX LIQD: 60.0000 mL | Freq: Once | CUTANEOUS | Status: DC

## 2019-09-12 MED ORDER — ONDANSETRON HCL 4 MG/2ML IJ SOLN: 4.0000 mg | Freq: Four times a day (QID) | INTRAMUSCULAR | Status: DC | PRN

## 2019-09-12 MED ORDER — PHENOL 1.4 % MT LIQD: 1.0000 | OROMUCOSAL | Status: DC | PRN

## 2019-09-12 MED ORDER — ONDANSETRON HCL 4 MG/2ML IJ SOLN
INTRAMUSCULAR | Status: AC
  Filled 2019-09-12: qty 2

## 2019-09-12 MED ORDER — SERTRALINE HCL 50 MG PO TABS
50.0000 mg | ORAL_TABLET | ORAL | Status: DC
  Administered 2019-09-13: 50 mg via ORAL
  Filled 2019-09-12: qty 1

## 2019-09-12 MED ORDER — PROPOFOL 1000 MG/100ML IV EMUL
INTRAVENOUS | Status: AC
  Filled 2019-09-12: qty 100

## 2019-09-12 MED ORDER — BUPIVACAINE IN DEXTROSE 0.75-8.25 % IT SOLN
INTRATHECAL | Status: DC | PRN
  Administered 2019-09-12: 1.6 mL via INTRATHECAL

## 2019-09-12 MED ORDER — DEXAMETHASONE SODIUM PHOSPHATE 10 MG/ML IJ SOLN
INTRAMUSCULAR | Status: AC
  Filled 2019-09-12: qty 1

## 2019-09-12 MED ORDER — CEFAZOLIN SODIUM-DEXTROSE 2-4 GM/100ML-% IV SOLN
INTRAVENOUS | Status: AC
  Filled 2019-09-12: qty 100

## 2019-09-12 MED ORDER — DIPHENHYDRAMINE HCL 12.5 MG/5ML PO ELIX: 12.5000 mg | ORAL_SOLUTION | ORAL | Status: DC | PRN

## 2019-09-12 MED ORDER — DOCUSATE SODIUM 100 MG PO CAPS
100.0000 mg | ORAL_CAPSULE | Freq: Two times a day (BID) | ORAL | Status: DC
  Administered 2019-09-12 – 2019-09-13 (×2): 100 mg via ORAL
  Filled 2019-09-12 (×2): qty 1

## 2019-09-12 MED ORDER — ONDANSETRON HCL 4 MG/2ML IJ SOLN: 4.0000 mg | Freq: Once | INTRAMUSCULAR | Status: DC | PRN

## 2019-09-12 MED ORDER — ROPIVACAINE HCL 5 MG/ML IJ SOLN
INTRAMUSCULAR | Status: DC | PRN
  Administered 2019-09-12: 20 mL via PERINEURAL

## 2019-09-12 MED ORDER — HYDROCODONE-ACETAMINOPHEN 7.5-325 MG PO TABS
1.0000 | ORAL_TABLET | ORAL | Status: DC | PRN
  Administered 2019-09-12: 2 via ORAL
  Filled 2019-09-12: qty 2

## 2019-09-12 MED ORDER — BISACODYL 5 MG PO TBEC: 5.0000 mg | DELAYED_RELEASE_TABLET | Freq: Every day | ORAL | Status: DC | PRN

## 2019-09-12 MED ORDER — KETOROLAC TROMETHAMINE 15 MG/ML IJ SOLN
7.5000 mg | Freq: Four times a day (QID) | INTRAMUSCULAR | Status: AC
  Administered 2019-09-12 – 2019-09-13 (×4): 7.5 mg via INTRAVENOUS
  Filled 2019-09-12 (×4): qty 1

## 2019-09-12 MED ORDER — METHOCARBAMOL 500 MG IVPB - SIMPLE MED
INTRAVENOUS | Status: AC
  Filled 2019-09-12: qty 50

## 2019-09-12 MED ORDER — METHOCARBAMOL 500 MG PO TABS
500.0000 mg | ORAL_TABLET | Freq: Four times a day (QID) | ORAL | Status: DC | PRN
  Administered 2019-09-12 – 2019-09-13 (×2): 500 mg via ORAL
  Filled 2019-09-12 (×2): qty 1

## 2019-09-12 MED ORDER — PROPOFOL 500 MG/50ML IV EMUL
INTRAVENOUS | Status: DC | PRN
  Administered 2019-09-12: 20 mg via INTRAVENOUS

## 2019-09-12 MED ORDER — PROPOFOL 10 MG/ML IV BOLUS
INTRAVENOUS | Status: AC
  Filled 2019-09-12: qty 20

## 2019-09-12 MED ORDER — EPHEDRINE SULFATE-NACL 50-0.9 MG/10ML-% IV SOSY
PREFILLED_SYRINGE | INTRAVENOUS | Status: DC | PRN
  Administered 2019-09-12 (×2): 5 mg via INTRAVENOUS
  Administered 2019-09-12: 10 mg via INTRAVENOUS
  Administered 2019-09-12 (×3): 5 mg via INTRAVENOUS

## 2019-09-12 MED ORDER — MIDAZOLAM HCL 2 MG/2ML IJ SOLN
INTRAMUSCULAR | Status: AC
  Filled 2019-09-12: qty 2

## 2019-09-12 MED ORDER — ONDANSETRON HCL 4 MG PO TABS: 4.0000 mg | ORAL_TABLET | Freq: Four times a day (QID) | ORAL | Status: DC | PRN

## 2019-09-12 MED ORDER — CEFAZOLIN SODIUM-DEXTROSE 2-4 GM/100ML-% IV SOLN
2.0000 g | INTRAVENOUS | Status: AC
  Administered 2019-09-12: 08:00:00 2 g via INTRAVENOUS

## 2019-09-12 MED ORDER — SODIUM CHLORIDE 0.9 % IR SOLN
Status: DC | PRN
  Administered 2019-09-12: 3000 mL

## 2019-09-12 MED ORDER — TRANEXAMIC ACID 1000 MG/10ML IV SOLN
INTRAVENOUS | Status: DC | PRN
  Administered 2019-09-12: 2000 mg via TOPICAL

## 2019-09-12 MED ORDER — OXYCODONE HCL 5 MG/5ML PO SOLN: 5.0000 mg | Freq: Once | ORAL | Status: DC | PRN

## 2019-09-12 MED ORDER — METOCLOPRAMIDE HCL 5 MG/ML IJ SOLN: 5.0000 mg | Freq: Three times a day (TID) | INTRAMUSCULAR | Status: DC | PRN

## 2019-09-12 MED ORDER — OXYCODONE HCL 5 MG PO TABS: 5.0000 mg | ORAL_TABLET | Freq: Once | ORAL | Status: DC | PRN

## 2019-09-12 MED ORDER — STERILE WATER FOR IRRIGATION IR SOLN
Status: DC | PRN
  Administered 2019-09-12: 2000 mL

## 2019-09-12 MED ORDER — FENTANYL CITRATE (PF) 100 MCG/2ML IJ SOLN
25.0000 ug | INTRAMUSCULAR | Status: DC | PRN
  Administered 2019-09-12 (×2): 50 ug via INTRAVENOUS

## 2019-09-12 MED ORDER — MORPHINE SULFATE (PF) 2 MG/ML IV SOLN: 0.5000 mg | INTRAVENOUS | Status: DC | PRN

## 2019-09-12 MED ORDER — TRANEXAMIC ACID-NACL 1000-0.7 MG/100ML-% IV SOLN: 1000.0000 mg | INTRAVENOUS | Status: DC

## 2019-09-12 MED ORDER — BUPIVACAINE-EPINEPHRINE 0.5% -1:200000 IJ SOLN
INTRAMUSCULAR | Status: DC | PRN
  Administered 2019-09-12: 30 mL

## 2019-09-12 MED ORDER — MIDAZOLAM HCL 2 MG/2ML IJ SOLN
INTRAMUSCULAR | Status: DC | PRN
  Administered 2019-09-12: 1 mg via INTRAVENOUS

## 2019-09-12 MED ORDER — TRAZODONE HCL 100 MG PO TABS
100.0000 mg | ORAL_TABLET | Freq: Every day | ORAL | Status: DC
  Administered 2019-09-12: 100 mg via ORAL
  Filled 2019-09-12: qty 1

## 2019-09-12 MED ORDER — PRAVASTATIN SODIUM 20 MG PO TABS
80.0000 mg | ORAL_TABLET | Freq: Every day | ORAL | Status: DC
  Administered 2019-09-13: 09:00:00 80 mg via ORAL
  Filled 2019-09-12: qty 4

## 2019-09-12 MED ORDER — ONDANSETRON HCL 4 MG/2ML IJ SOLN
INTRAMUSCULAR | Status: DC | PRN
  Administered 2019-09-12: 4 mg via INTRAVENOUS

## 2019-09-12 MED ORDER — LIDOCAINE 2% (20 MG/ML) 5 ML SYRINGE
INTRAMUSCULAR | Status: DC | PRN
  Administered 2019-09-12: 60 mg via INTRAVENOUS

## 2019-09-12 MED ORDER — LIDOCAINE 2% (20 MG/ML) 5 ML SYRINGE
INTRAMUSCULAR | Status: AC
  Filled 2019-09-12: qty 5

## 2019-09-12 MED ORDER — ACETAMINOPHEN 500 MG PO TABS
500.0000 mg | ORAL_TABLET | Freq: Four times a day (QID) | ORAL | Status: AC
  Administered 2019-09-12 – 2019-09-13 (×2): 500 mg via ORAL
  Filled 2019-09-12 (×3): qty 1

## 2019-09-12 MED ORDER — HYDROCODONE-ACETAMINOPHEN 5-325 MG PO TABS
1.0000 | ORAL_TABLET | ORAL | Status: DC | PRN
  Administered 2019-09-12: 2 via ORAL
  Administered 2019-09-13 (×2): 1 via ORAL
  Filled 2019-09-12: qty 2
  Filled 2019-09-12 (×2): qty 1
  Filled 2019-09-12: qty 2

## 2019-09-12 SURGICAL SUPPLY — 57 items
ATTUNE MED DOME PAT 32 KNEE (Knees) ×1 IMPLANT
ATTUNE MED DOME PAT 32MM KNEE (Knees) ×1 IMPLANT
ATTUNE PS FEM RT SZ 3 CEM KNEE (Femur) ×2 IMPLANT
ATTUNE PSRP INSR SZ3 6 KNEE (Insert) ×1 IMPLANT
ATTUNE PSRP INSR SZ3 6MM KNEE (Insert) ×1 IMPLANT
BAG DECANTER FOR FLEXI CONT (MISCELLANEOUS) ×3 IMPLANT
BAG SPEC THK2 15X12 ZIP CLS (MISCELLANEOUS) ×1
BAG ZIPLOCK 12X15 (MISCELLANEOUS) ×3 IMPLANT
BASE TIBIAL ROT PLAT SZ 3 KNEE (Knees) IMPLANT
BLADE SAGITTAL 25.0X1.19X90 (BLADE) ×2 IMPLANT
BLADE SAGITTAL 25.0X1.19X90MM (BLADE) ×1
BLADE SAW SGTL 11.0X1.19X90.0M (BLADE) ×3 IMPLANT
BNDG ELASTIC 6X5.8 VLCR STR LF (GAUZE/BANDAGES/DRESSINGS) ×3 IMPLANT
BOOTIES KNEE HIGH SLOAN (MISCELLANEOUS) ×3 IMPLANT
BOWL SMART MIX CTS (DISPOSABLE) ×3 IMPLANT
BSPLAT TIB 3 CMNT ROT PLAT STR (Knees) ×1 IMPLANT
CEMENT HV SMART SET (Cement) ×6 IMPLANT
COVER SURGICAL LIGHT HANDLE (MISCELLANEOUS) ×3 IMPLANT
COVER WAND RF STERILE (DRAPES) ×3 IMPLANT
CUFF TOURN SGL QUICK 34 (TOURNIQUET CUFF) ×3
CUFF TRNQT CYL 34X4.125X (TOURNIQUET CUFF) ×1 IMPLANT
DECANTER SPIKE VIAL GLASS SM (MISCELLANEOUS) ×6 IMPLANT
DRAPE SHEET LG 3/4 BI-LAMINATE (DRAPES) ×3 IMPLANT
DRAPE TOP 10253 STERILE (DRAPES) ×3 IMPLANT
DRAPE U-SHAPE 47X51 STRL (DRAPES) ×3 IMPLANT
DRESSING AQUACEL AG SP 3.5X10 (GAUZE/BANDAGES/DRESSINGS) IMPLANT
DRSG AQUACEL AG ADV 3.5X10 (GAUZE/BANDAGES/DRESSINGS) ×3 IMPLANT
DRSG AQUACEL AG SP 3.5X10 (GAUZE/BANDAGES/DRESSINGS) ×3
DURAPREP 26ML APPLICATOR (WOUND CARE) ×6 IMPLANT
ELECT REM PT RETURN 15FT ADLT (MISCELLANEOUS) ×3 IMPLANT
GLOVE BIO SURGEON STRL SZ8 (GLOVE) ×6 IMPLANT
GLOVE BIOGEL PI IND STRL 8 (GLOVE) ×2 IMPLANT
GLOVE BIOGEL PI INDICATOR 8 (GLOVE) ×4
GOWN STRL REUS W/TWL XL LVL3 (GOWN DISPOSABLE) ×6 IMPLANT
HANDPIECE INTERPULSE COAX TIP (DISPOSABLE) ×3
HOLDER FOLEY CATH W/STRAP (MISCELLANEOUS) ×2 IMPLANT
HOOD PEEL AWAY FLYTE STAYCOOL (MISCELLANEOUS) ×9 IMPLANT
KIT TURNOVER KIT A (KITS) IMPLANT
MANIFOLD NEPTUNE II (INSTRUMENTS) ×3 IMPLANT
NS IRRIG 1000ML POUR BTL (IV SOLUTION) ×3 IMPLANT
PACK TOTAL KNEE CUSTOM (KITS) ×3 IMPLANT
PAD ARMBOARD 7.5X6 YLW CONV (MISCELLANEOUS) ×3 IMPLANT
PENCIL SMOKE EVACUATOR (MISCELLANEOUS) ×2 IMPLANT
PIN DRILL FIX HALF THREAD (BIT) ×2 IMPLANT
PIN STEINMAN FIXATION KNEE (PIN) ×2 IMPLANT
PROTECTOR NERVE ULNAR (MISCELLANEOUS) ×3 IMPLANT
SET HNDPC FAN SPRY TIP SCT (DISPOSABLE) ×1 IMPLANT
SUT ETHIBOND NAB CT1 #1 30IN (SUTURE) ×6 IMPLANT
SUT VIC AB 0 CT1 36 (SUTURE) ×3 IMPLANT
SUT VIC AB 2-0 CT1 27 (SUTURE) ×3
SUT VIC AB 2-0 CT1 TAPERPNT 27 (SUTURE) ×1 IMPLANT
SUT VICRYL AB 3-0 FS1 BRD 27IN (SUTURE) ×3 IMPLANT
TIBIAL BASE ROT PLAT SZ 3 KNEE (Knees) ×3 IMPLANT
TRAY FOLEY MTR SLVR 14FR STAT (SET/KITS/TRAYS/PACK) ×2 IMPLANT
TRAY FOLEY MTR SLVR 16FR STAT (SET/KITS/TRAYS/PACK) IMPLANT
WATER STERILE IRR 1000ML POUR (IV SOLUTION) ×3 IMPLANT
WRAP KNEE MAXI GEL POST OP (GAUZE/BANDAGES/DRESSINGS) ×3 IMPLANT

## 2019-09-12 NOTE — Transfer of Care (Signed)
Immediate Anesthesia Transfer of Care Note  Patient: ANAHIS MININGER  Procedure(s) Performed: RIGHT TOTAL KNEE ARTHROPLASTY (Right Knee)  Patient Location: PACU  Anesthesia Type:Spinal  Level of Consciousness: awake, alert  and oriented  Airway & Oxygen Therapy: Patient Spontanous Breathing and Patient connected to face mask oxygen  Post-op Assessment: Report given to RN and Post -op Vital signs reviewed and stable  Post vital signs: Reviewed and stable  Last Vitals:  Vitals Value Taken Time  BP 99/54 09/12/19 0947  Temp    Pulse 64 09/12/19 0949  Resp 13 09/12/19 0949  SpO2 99 % 09/12/19 0949  Vitals shown include unvalidated device data.  Last Pain:  Vitals:   09/12/19 0626  TempSrc: Oral  PainSc: 0-No pain         Complications: No apparent anesthesia complications

## 2019-09-12 NOTE — Anesthesia Procedure Notes (Signed)
Procedure Name: MAC Date/Time: 09/12/2019 7:31 AM Performed by: Eben Burow, CRNA Pre-anesthesia Checklist: Patient identified, Emergency Drugs available, Suction available, Patient being monitored and Timeout performed Oxygen Delivery Method: Simple face mask Dental Injury: Teeth and Oropharynx as per pre-operative assessment

## 2019-09-12 NOTE — Anesthesia Procedure Notes (Signed)
Spinal  Patient location during procedure: OR Start time: 09/12/2019 7:41 AM Staffing Performed: resident/CRNA  Anesthesiologist: Lidia Collum, MD Resident/CRNA: Eben Burow, CRNA Preanesthetic Checklist Completed: patient identified, IV checked, site marked, risks and benefits discussed, surgical consent, monitors and equipment checked, pre-op evaluation and timeout performed Spinal Block Patient position: sitting Prep: DuraPrep and site prepped and draped Patient monitoring: heart rate, cardiac monitor, continuous pulse ox and blood pressure Approach: midline Location: L3-4 Injection technique: single-shot Needle Needle type: Pencan  Needle gauge: 24 G Needle length: 9 cm Assessment Sensory level: T4 Additional Notes Pt placed in sitting position, spinal kit expiration date checked and verified, + CSF, - heme, pt tolerated well. Dr Christella Hartigan present and supervising for SAB placement.

## 2019-09-12 NOTE — Anesthesia Postprocedure Evaluation (Signed)
Anesthesia Post Note  Patient: Alexis Kramer  Procedure(s) Performed: RIGHT TOTAL KNEE ARTHROPLASTY (Right Knee)     Patient location during evaluation: PACU Anesthesia Type: Spinal Level of consciousness: oriented and awake and alert Pain management: pain level controlled Vital Signs Assessment: post-procedure vital signs reviewed and stable Respiratory status: spontaneous breathing, respiratory function stable and nonlabored ventilation Cardiovascular status: blood pressure returned to baseline and stable Postop Assessment: no headache, no backache, no apparent nausea or vomiting and spinal receding Anesthetic complications: no    Last Vitals:  Vitals:   09/12/19 1030 09/12/19 1045  BP: 115/60 (!) 111/57  Pulse: (!) 58 (!) 54  Resp: 11 11  Temp: (!) 36.4 C (!) 36.4 C  SpO2: 100% 100%    Last Pain:  Vitals:   09/12/19 1100  TempSrc:   PainSc: Asleep                 Lidia Collum

## 2019-09-12 NOTE — Anesthesia Procedure Notes (Signed)
Anesthesia Regional Block: Adductor canal block   Pre-Anesthetic Checklist: ,, timeout performed, Correct Patient, Correct Site, Correct Laterality, Correct Procedure, Correct Position, site marked, Risks and benefits discussed,  Surgical consent,  Pre-op evaluation,  At surgeon's request and post-op pain management  Laterality: Right  Prep: chloraprep       Needles:  Injection technique: Single-shot  Needle Type: Echogenic Stimulator Needle     Needle Length: 10cm  Needle Gauge: 21     Additional Needles:   Procedures:,,,, ultrasound used (permanent image in chart),,,,  Narrative:  Start time: 09/12/2019 7:00 AM End time: 09/12/2019 7:04 AM Injection made incrementally with aspirations every 5 mL.  Performed by: Personally  Anesthesiologist: Lidia Collum, MD  Additional Notes: Monitors applied. Injection made in 5cc increments. No resistance to injection. Good needle visualization. Patient tolerated procedure well.

## 2019-09-12 NOTE — Plan of Care (Signed)
  Problem: Clinical Measurements: Goal: Cardiovascular complication will be avoided Outcome: Progressing   Problem: Clinical Measurements: Goal: Respiratory complications will improve Outcome: Progressing   Problem: Coping: Goal: Level of anxiety will decrease Outcome: Progressing   Problem: Elimination: Goal: Will not experience complications related to bowel motility Outcome: Progressing   Problem: Pain Managment: Goal: General experience of comfort will improve Outcome: Progressing   Problem: Safety: Goal: Ability to remain free from injury will improve Outcome: Progressing

## 2019-09-12 NOTE — Op Note (Signed)
PREOP DIAGNOSIS: DJD RIGHT KNEE POSTOP DIAGNOSIS: same PROCEDURE: RIGHT TKR ANESTHESIA: Spinal and MAC ATTENDING SURGEON: Hessie Dibble ASSISTANT: Loni Dolly PA  INDICATIONS FOR PROCEDURE: Alexis Kramer is a 79 y.o. female who has struggled for a long time with pain due to degenerative arthritis of the right knee.  The patient has failed many conservative non-operative measures and at this point has pain which limits the ability to sleep and walk.  The patient is offered total knee replacement.  Informed operative consent was obtained after discussion of possible risks of anesthesia, infection, neurovascular injury, DVT, and death.  The importance of the post-operative rehabilitation protocol to optimize result was stressed extensively with the patient.  SUMMARY OF FINDINGS AND PROCEDURE:  Alexis Kramer was taken to the operative suite where under the above anesthesia a right knee replacement was performed.  There were advanced degenerative changes and the bone quality was poor.  We used the DePuy Attune system and placed size 3 femur, 3 tibia, 32 mm all polyethylene patella, and a size 6 mm spacer.  Loni Dolly PA-C assisted throughout and was invaluable to the completion of the case in that he helped retract and maintain exposure while I placed components.  He also helped close thereby minimizing OR time.  The patient was admitted for appropriate post-op care to include perioperative antibiotics and mechanical and pharmacologic measures for DVT prophylaxis.  DESCRIPTION OF PROCEDURE:  TEMIA DEBROUX was taken to the operative suite where the above anesthesia was applied.  The patient was positioned supine and prepped and draped in normal sterile fashion.  An appropriate time out was performed.  After the administration of kefzol pre-op antibiotic the leg was elevated and exsanguinated and a tourniquet inflated. A standard longitudinal incision was made on the anterior knee.  Dissection was  carried down to the extensor mechanism.  All appropriate anti-infective measures were used including the pre-operative antibiotic, betadine impregnated drape, and closed hooded exhaust systems for each member of the surgical team.  A medial parapatellar incision was made in the extensor mechanism and the knee cap flipped and the knee flexed.  Some residual meniscal tissues were removed along with any remaining ACL/PCL tissue.  A guide was placed on the tibia and a flat cut was made on it's superior surface.  An intramedullary guide was placed in the femur and was utilized to make anterior and posterior cuts creating an appropriate flexion gap.  A second intramedullary guide was placed in the femur to make a distal cut properly balancing the knee with an extension gap equal to the flexion gap.  The three bones sized to the above mentioned sizes and the appropriate guides were placed and utilized.  A trial reduction was done and the knee easily came to full extension and the patella tracked well on flexion.  The trial components were removed and all bones were cleaned with pulsatile lavage and then dried thoroughly.  Cement was mixed and was pressurized onto the bones followed by placement of the aforementioned components.  Excess cement was trimmed and pressure was held on the components until the cement had hardened.  The tourniquet was deflated and a small amount of bleeding was controlled with cautery and pressure.  The knee was irrigated thoroughly.  The extensor mechanism was re-approximated with #1 ethibond in interrupted fashion.  The knee was flexed and the repair was solid.  The subcutaneous tissues were re-approximated with #0 and #2-0 vicryl and the skin closed with  a subcuticular stitch and steristrips.  A sterile dressing was applied.  Intraoperative fluids, EBL, and tourniquet time can be obtained from anesthesia records.  DISPOSITION:  The patient was taken to recovery room in stable condition and  admitted for appropriate post-op care to include peri-operative antibiotic and DVT prophylaxis with mechanical and pharmacologic measures.  Hessie Dibble 09/12/2019, 9:23 AM

## 2019-09-12 NOTE — Anesthesia Preprocedure Evaluation (Signed)
Anesthesia Evaluation  Patient identified by MRN, date of birth, ID band Patient awake    Reviewed: Allergy & Precautions, NPO status , Patient's Chart, lab work & pertinent test results  History of Anesthesia Complications Negative for: history of anesthetic complications  Airway Mallampati: II  TM Distance: >3 FB Neck ROM: Full    Dental   Pulmonary neg pulmonary ROS,    Pulmonary exam normal        Cardiovascular negative cardio ROS Normal cardiovascular exam     Neuro/Psych negative neurological ROS  negative psych ROS   GI/Hepatic negative GI ROS, Neg liver ROS,   Endo/Other  negative endocrine ROS  Renal/GU negative Renal ROS  negative genitourinary   Musculoskeletal negative musculoskeletal ROS (+)   Abdominal   Peds  Hematology negative hematology ROS (+)   Anesthesia Other Findings  HLD Hgb 14.9, plts 221, INR 1.0  Reproductive/Obstetrics                             Anesthesia Physical Anesthesia Plan  ASA: II  Anesthesia Plan: Spinal   Post-op Pain Management:    Induction:   PONV Risk Score and Plan: 2 and Propofol infusion, Treatment may vary due to age or medical condition, Ondansetron and TIVA  Airway Management Planned: Nasal Cannula and Simple Face Mask  Additional Equipment: None  Intra-op Plan:   Post-operative Plan:   Informed Consent: I have reviewed the patients History and Physical, chart, labs and discussed the procedure including the risks, benefits and alternatives for the proposed anesthesia with the patient or authorized representative who has indicated his/her understanding and acceptance.       Plan Discussed with:   Anesthesia Plan Comments:         Anesthesia Quick Evaluation

## 2019-09-12 NOTE — Interval H&P Note (Signed)
History and Physical Interval Note:  09/12/2019 7:30 AM  Alexis Kramer  has presented today for surgery, with the diagnosis of RIGHT KNEE DEGENERATIVE JOINT DISEASE.  The various methods of treatment have been discussed with the patient and family. After consideration of risks, benefits and other options for treatment, the patient has consented to  Procedure(s): RIGHT TOTAL KNEE ARTHROPLASTY (Right) as a surgical intervention.  The patient's history has been reviewed, patient examined, no change in status, stable for surgery.  I have reviewed the patient's chart and labs.  Questions were answered to the patient's satisfaction.     Hessie Dibble

## 2019-09-12 NOTE — Evaluation (Signed)
Physical Therapy Evaluation Patient Details Name: Alexis Kramer MRN: [id number] DOB: Jan 24, 1941 Today's Date: 09/12/2019   History of Present Illness  Patient is 79 y.o. female s/p Rt TKA on 09/12/19 with PMH significant for breast cancer and mastectomy.  Clinical Impression  Alexis Kramer is a 79 y.o. female POD 0 s/p Rt TKA. Patient reports modified independence with use of SPC for last ~3 months due to pain with mobility. Patient is now limited by functional impairments (see PT problem list below) and requires min assist for transfers and gait with RW. Patient was able to ambulate ~40 feet with RW and min assist. Patient instructed in exercise to facilitate ROM and circulation. Patient will benefit from continued skilled PT interventions to address impairments and progress towards PLOF. Acute PT will follow to progress mobility and stair training in preparation for safe discharge home.     Follow Up Recommendations Follow surgeon's recommendation for DC plan and follow-up therapies    Equipment Recommendations  Rolling walker with 5" wheels;3in1 (PT)(youth walker)    Recommendations for Other Services       Precautions / Restrictions Precautions Precautions: Fall Restrictions Weight Bearing Restrictions: No Other Position/Activity Restrictions: WBAT      Mobility  Bed Mobility Overal bed mobility: Needs Assistance Bed Mobility: Supine to Sit     Supine to sit: Min assist;HOB elevated     General bed mobility comments: cues for reaching to bed rail and to bring LE's to EOB. Assist required to raise trunk upright.  Transfers Overall transfer level: Needs assistance Equipment used: Rolling walker (2 wheeled) Transfers: Sit to/from Stand Sit to Stand: Min assist         General transfer comment: cues for hand placement and technique with RW. Assist required for power up and to steady with rising.   Ambulation/Gait Ambulation/Gait assistance: Min assist Gait  Distance (Feet): 40 Feet Assistive device: Rolling walker (2 wheeled) Gait Pattern/deviations: Step-to pattern;Decreased stance time - right;Decreased stride length;Decreased weight shift to right Gait velocity: decreased   General Gait Details: verbal cues for safe step pattern and assist to manage/position RW during gait. Assist at Rt knee initially to facilitate extension in stance phase, pt improved ability to reduce weigth on Rt LE and prevent buckling throughout ambulation.  Stairs     Wheelchair Mobility    Modified Rankin (Stroke Patients Only)       Balance Overall balance assessment: Needs assistance Sitting-balance support: Feet supported Sitting balance-Leahy Scale: Good     Standing balance support: Bilateral upper extremity supported;During functional activity Standing balance-Leahy Scale: Poor                Pertinent Vitals/Pain Pain Assessment: 0-10 Pain Score: 8  Pain Location: Rt knee Pain Descriptors / Indicators: Aching;Discomfort Pain Intervention(s): Limited activity within patient's tolerance;Monitored during session;Repositioned;Ice applied    Home Living Family/patient expects to be discharged to:: Private residence Living Arrangements: Spouse/significant other Available Help at Discharge: Family Type of Home: House Home Access: Stairs to enter Entrance Stairs-Rails: Right Entrance Stairs-Number of Steps: 4 Home Layout: One level [place] - single point Additional Comments: pt owns her home and the one next door where her goddaughter stays. she plans to discharge there as there is more space to move around. She will have 4 stairs with Rt rail to enter (her actual main house has 5 steps with 2 rails). The bathroom at the main house has handicap toilets but not the house she will stay  in.    Prior Function Level of Independence: Independent with assistive device(s)         Comments: uses SPC for mobility, started usinog  about 3-4 months     Hand Dominance   Dominant Hand: Left    Extremity/Trunk Assessment   Upper Extremity Assessment Upper Extremity Assessment: Overall WFL for tasks assessed    Lower Extremity Assessment Lower Extremity Assessment: RLE deficits/detail RLE Deficits / Details: pt able to complete SR with mild extensor lag, 3+/5 for quad strength, pt limited by pain. slight buckling in standing but pt able to unweight Rt LE with support from RW. RLE Sensation: WNL RLE Coordination: WNL    Cervical / Trunk Assessment Cervical / Trunk Assessment: Normal  Communication   Communication: No difficulties  Cognition Arousal/Alertness: Awake/alert Behavior During Therapy: WFL for tasks assessed/performed Overall Cognitive Status: Within Functional Limits for tasks assessed       General Comments      Exercises Total Joint Exercises Ankle Circles/Pumps: AROM;Both;15 reps;Seated Quad Sets: AROM;Right;5 reps;Seated Heel Slides: AROM;Right;5 reps;Seated   Assessment/Plan    PT Assessment Patient needs continued PT services  PT Problem List Decreased strength;Decreased range of motion;Decreased activity tolerance;Decreased balance;Decreased mobility;Decreased knowledge of use of DME;Pain       PT Treatment Interventions DME instruction;Gait training;Stair training;Functional mobility training;Therapeutic exercise;Therapeutic activities;Balance training;Patient/family education    PT Goals (Current goals can be found in the Care Plan section)  Acute Rehab PT Goals Patient Stated Goal: to get back into her garden PT Goal Formulation: With patient Time For Goal Achievement: 09/19/19 Potential to Achieve Goals: Good    Frequency 7X/week    AM-PAC PT "6 Clicks" Mobility  Outcome Measure Help needed turning from your back to your side while in a flat bed without using bedrails?: A Little Help needed moving from lying on your back to sitting on the side of a flat bed without  using bedrails?: A Little Help needed moving to and from a bed to a chair (including a wheelchair)?: A Little Help needed standing up from a chair using your arms (e.g., wheelchair or bedside chair)?: A Little Help needed to walk in hospital room?: A Little Help needed climbing 3-5 steps with a railing? : A Lot 6 Click Score: 17    End of Session Equipment Utilized During Treatment: Gait belt Activity Tolerance: Patient tolerated treatment well Patient left: in chair;with call bell/phone within reach;with chair alarm set;with family/visitor present Nurse Communication: Mobility status PT Visit Diagnosis: Muscle weakness (generalized) (M62.81);Difficulty in walking, not elsewhere classified (R26.2);Pain Pain - Right/Left: Right Pain - part of body: Knee    Time: 1214-1238 PT Time Calculation (min) (ACUTE ONLY): 24 min   Charges:   PT Evaluation $PT Eval Low Complexity: 1 Low PT Treatments $Gait Training: 8-22 mins       Verner Mould, DPT Physical Therapist with [facility] [phone]  09/12/2019 12:59 PM

## 2019-09-13 ENCOUNTER — Encounter: Payer: Self-pay | Admitting: *Deleted

## 2019-09-13 DIAGNOSIS — M1711 Unilateral primary osteoarthritis, right knee: Secondary | ICD-10-CM | POA: Diagnosis not present

## 2019-09-13 MED ORDER — SODIUM CHLORIDE 0.9 % IV BOLUS
500.0000 mL | Freq: Once | INTRAVENOUS | Status: AC
  Administered 2019-09-13: 03:00:00 500 mL via INTRAVENOUS

## 2019-09-13 MED ORDER — TIZANIDINE HCL 4 MG PO TABS: 4.0000 mg | ORAL_TABLET | Freq: Four times a day (QID) | ORAL | 1 refills | Status: AC | PRN

## 2019-09-13 MED ORDER — ASPIRIN 81 MG PO CHEW: 81.0000 mg | CHEWABLE_TABLET | Freq: Two times a day (BID) | ORAL | 0 refills | Status: DC

## 2019-09-13 MED ORDER — HYDROCODONE-ACETAMINOPHEN 5-325 MG PO TABS: 1.0000 | ORAL_TABLET | Freq: Four times a day (QID) | ORAL | 0 refills | Status: DC | PRN

## 2019-09-13 NOTE — Plan of Care (Signed)

## 2019-09-13 NOTE — Discharge Summary (Signed)
Patient ID: Alexis Kramer MRN: [id number] DOB/AGE: 13-Nov-1940 79 y.o.  Admit date: 09/12/2019 Discharge date: 09/13/2019  Admission Diagnoses:  Principal Problem:   Primary osteoarthritis of right knee   Discharge Diagnoses:  Same  Past Medical History:  Diagnosis Date  . Breast cancer ([place])   . Cancer [facility])     Surgeries: Procedure(s): RIGHT TOTAL KNEE ARTHROPLASTY on 09/12/2019   Consultants:   Discharged Condition: Improved  Hospital Course: Alexis Kramer is an 79 y.o. female who was admitted 09/12/2019 for operative treatment ofPrimary osteoarthritis of right knee. Patient has severe unremitting pain that affects sleep, daily activities, and work/hobbies. After pre-op clearance the patient was taken to the operating room on 09/12/2019 and underwent  Procedure(s): RIGHT TOTAL KNEE ARTHROPLASTY.    Patient was given perioperative antibiotics:  Anti-infectives (From admission, onward)   Start     Dose/Rate Route Frequency Ordered Stop   09/12/19 1330  ceFAZolin (ANCEF) IVPB 2g/100 mL premix     2 g 200 mL/hr over 30 Minutes Intravenous Every 6 hours 09/12/19 1144 09/12/19 2110   09/12/19 0645  ceFAZolin (ANCEF) IVPB 2g/100 mL premix     2 g 200 mL/hr over 30 Minutes Intravenous On call to O.R. 09/12/19 [id number] 09/12/19 0745   09/12/19 [id number]  ceFAZolin (ANCEF) 2-4 GM/100ML-% IVPB    Note to Pharmacy: Karsten Ro   : cabinet override      09/12/19 0637 09/12/19 0843       Patient was given sequential compression devices, early ambulation, and chemoprophylaxis to prevent DVT.  Patient benefited maximally from hospital stay and there were no complications.    Recent vital signs:  Patient Vitals for the past 24 hrs:  BP Temp Temp src Pulse Resp SpO2 Height Weight  09/13/19 0525 106/64 97.9 F (36.6 C) Oral (!) 54 12 100 % -- --  09/13/19 0407 106/60 -- -- -- -- -- -- --  09/13/19 0202 (!) 99/56 97.9 F (36.6 C) Oral (!) 53 14 99 % -- --  09/12/19 2025 (!) 109/53 97.7  F (36.5 C) Oral (!) 59 12 100 % -- --  09/12/19 1436 109/65 97.6 F (36.4 C) Oral (!) 57 16 99 % -- --  09/12/19 1343 121/67 98.4 F (36.9 C) Oral (!) 59 16 99 % -- --  09/12/19 1243 128/71 97.7 F (36.5 C) Oral 62 16 100 % -- --  09/12/19 1142 136/72 97.9 F (36.6 C) Oral 61 16 99 % 5' (1.524 m) 60.5 kg  09/12/19 1115 (!) 98/49 -- -- (!) 55 13 100 % -- --  09/12/19 1100 (!) 111/46 -- -- (!) 55 12 100 % -- --  09/12/19 1045 (!) 111/57 (!) 97.5 F (36.4 C) -- (!) 54 11 100 % -- --  09/12/19 1030 115/60 (!) 97.5 F (36.4 C) -- (!) 58 11 100 % -- --  09/12/19 1015 102/76 (!) 97.5 F (36.4 C) -- 62 11 100 % -- --  09/12/19 1000 108/61 (!) 97.4 F (36.3 C) -- (!) 58 10 100 % -- --  09/12/19 0947 (!) 99/54 (!) 96.3 F (35.7 C) -- (!) 59 (!) 7 100 % -- --     Recent laboratory studies: No results for input(s): WBC, HGB, HCT, PLT, NA, K, CL, CO2, BUN, CREATININE, GLUCOSE, INR, CALCIUM in the last 72 hours.  Invalid input(s): PT, 2   Discharge Medications:   Allergies as of 09/13/2019   No Known Allergies     Medication List  TAKE these medications   aspirin 81 MG chewable tablet Chew 1 tablet (81 mg total) by mouth 2 (two) times daily.   cephALEXin 500 MG capsule Commonly known as: KEFLEX Take 500 mg by mouth 2 (two) times daily.   HYDROcodone-acetaminophen 5-325 MG tablet Commonly known as: NORCO/VICODIN Take 1-2 tablets by mouth every 6 (six) hours as needed for moderate pain or severe pain (post op pain).   pravastatin 80 MG tablet Commonly known as: PRAVACHOL Take 80 mg by mouth daily.   sertraline 50 MG tablet Commonly known as: ZOLOFT Take 50 mg by mouth 3 (three) times a week.   tiZANidine 4 MG tablet Commonly known as: Zanaflex Take 1 tablet (4 mg total) by mouth every 6 (six) hours as needed for muscle spasms.   traMADol 50 MG tablet Commonly known as: ULTRAM Take 50 mg by mouth daily as needed for pain.   traZODone 100 MG tablet Commonly known as:  DESYREL Take 100 mg by mouth at bedtime.            Durable Medical Equipment  (From admission, onward)         Start     Ordered   09/12/19 1145  DME Walker rolling  Once    Question:  Patient needs a walker to treat with the following condition  Answer:  Primary osteoarthritis of right knee   09/12/19 1144   09/12/19 1145  DME 3 n 1  Once     09/12/19 1144   09/12/19 1145  DME Bedside commode  Once    Question:  Patient needs a bedside commode to treat with the following condition  Answer:  Primary osteoarthritis of right knee   09/12/19 1144          Diagnostic Studies: DG Chest 2 View  Result Date: 09/04/2019 CLINICAL DATA:  Preoperative evaluation EXAM: CHEST - 2 VIEW COMPARISON:  None. FINDINGS: No consolidation or edema. No pleural effusion. Normal heart size. No acute osseous abnormality. Surgical clips overlie the left breast and upper abdomen. IMPRESSION: No acute process in the chest. Electronically Signed   By: Macy Mis M.D.   On: 09/04/2019 14:35    Disposition: Discharge disposition: 01-Home or Self Care       Discharge Instructions    Call MD / Call 911   Complete by: As directed    If you experience chest pain or shortness of breath, CALL 911 and be transported to the hospital emergency room.  If you develope a fever above 101 F, pus (white drainage) or increased drainage or redness at the wound, or calf pain, call your surgeon's office.   Constipation Prevention   Complete by: As directed    Drink plenty of fluids.  Prune juice may be helpful.  You may use a stool softener, such as Colace (over the counter) 100 mg twice a day.  Use MiraLax (over the counter) for constipation as needed.   Diet - low sodium heart healthy   Complete by: As directed    Discharge instructions   Complete by: As directed    INSTRUCTIONS AFTER JOINT REPLACEMENT   Remove items at home which could result in a fall. This includes throw rugs or furniture in walking  pathways ICE to the affected joint every three hours while awake for 30 minutes at a time, for at least the first 3-5 days, and then as needed for pain and swelling.  Continue to use ice for pain and swelling. You  may notice swelling that will progress down to the foot and ankle.  This is normal after surgery.  Elevate your leg when you are not up walking on it.   Continue to use the breathing machine you got in the hospital (incentive spirometer) which will help keep your temperature down.  It is common for your temperature to cycle up and down following surgery, especially at night when you are not up moving around and exerting yourself.  The breathing machine keeps your lungs expanded and your temperature down.   DIET:  As you were doing prior to hospitalization, we recommend a well-balanced diet.  DRESSING / WOUND CARE / SHOWERING  You may shower 3 days after surgery, but keep the wounds dry during showering.  You may use an occlusive plastic wrap (Press'n Seal for example), NO SOAKING/SUBMERGING IN THE BATHTUB.  If the bandage gets wet, change with a clean dry gauze.  If the incision gets wet, pat the wound dry with a clean towel.  ACTIVITY  Increase activity slowly as tolerated, but follow the weight bearing instructions below.   No driving for 6 weeks or until further direction given by your physician.  You cannot drive while taking narcotics.  No lifting or carrying greater than 10 lbs. until further directed by your surgeon. Avoid periods of inactivity such as sitting longer than an hour when not asleep. This helps prevent blood clots.  You may return to work once you are authorized by your doctor.     WEIGHT BEARING   Weight bearing as tolerated with assist device (walker, cane, etc) as directed, use it as long as suggested by your surgeon or therapist, typically at least 4-6 weeks.   EXERCISES  Results after joint replacement surgery are often greatly improved when you follow  the exercise, range of motion and muscle strengthening exercises prescribed by your doctor. Safety measures are also important to protect the joint from further injury. Any time any of these exercises cause you to have increased pain or swelling, decrease what you are doing until you are comfortable again and then slowly increase them. If you have problems or questions, call your caregiver or physical therapist for advice.   Rehabilitation is important following a joint replacement. After just a few days of immobilization, the muscles of the leg can become weakened and shrink (atrophy).  These exercises are designed to build up the tone and strength of the thigh and leg muscles and to improve motion. Often times heat used for twenty to thirty minutes before working out will loosen up your tissues and help with improving the range of motion but do not use heat for the first two weeks following surgery (sometimes heat can increase post-operative swelling).   These exercises can be done on a training (exercise) mat, on the floor, on a table or on a bed. Use whatever works the best and is most comfortable for you.    Use music or television while you are exercising so that the exercises are a pleasant break in your day. This will make your life better with the exercises acting as a break in your routine that you can look forward to.   Perform all exercises about fifteen times, three times per day or as directed.  You should exercise both the operative leg and the other leg as well.   Exercises include:   Quad Sets - Tighten up the muscle on the front of the thigh (Quad) and hold for 5-10 seconds.  Straight Leg Raises - With your knee straight (if you were given a brace, keep it on), lift the leg to 60 degrees, hold for 3 seconds, and slowly lower the leg.  Perform this exercise against resistance later as your leg gets stronger.  Leg Slides: Lying on your back, slowly slide your foot toward your buttocks,  bending your knee up off the floor (only go as far as is comfortable). Then slowly slide your foot back down until your leg is flat on the floor again.  Angel Wings: Lying on your back spread your legs to the side as far apart as you can without causing discomfort.  Hamstring Strength:  Lying on your back, push your heel against the floor with your leg straight by tightening up the muscles of your buttocks.  Repeat, but this time bend your knee to a comfortable angle, and push your heel against the floor.  You may put a pillow under the heel to make it more comfortable if necessary.   A rehabilitation program following joint replacement surgery can speed recovery and prevent re-injury in the future due to weakened muscles. Contact your doctor or a physical therapist for more information on knee rehabilitation.    CONSTIPATION  Constipation is defined medically as fewer than three stools per week and severe constipation as less than one stool per week.  Even if you have a regular bowel pattern at home, your normal regimen is likely to be disrupted due to multiple reasons following surgery.  Combination of anesthesia, postoperative narcotics, change in appetite and fluid intake all can affect your bowels.   YOU MUST use at least one of the following options; they are listed in order of increasing strength to get the job done.  They are all available over the counter, and you may need to use some, POSSIBLY even all of these options:    Drink plenty of fluids (prune juice may be helpful) and high fiber foods Colace 100 mg by mouth twice a day  Senokot for constipation as directed and as needed Dulcolax (bisacodyl), take with full glass of water  Miralax (polyethylene glycol) once or twice a day as needed.  If you have tried all these things and are unable to have a bowel movement in the first 3-4 days after surgery call either your surgeon or your primary doctor.    If you experience loose stools or  diarrhea, hold the medications until you stool forms back up.  If your symptoms do not get better within 1 week or if they get worse, check with your doctor.  If you experience "the worst abdominal pain ever" or develop nausea or vomiting, please contact the office immediately for further recommendations for treatment.   ITCHING:  If you experience itching with your medications, try taking only a single pain pill, or even half a pain pill at a time.  You can also use Benadryl over the counter for itching or also to help with sleep.   TED HOSE STOCKINGS:  Use stockings on both legs until for at least 2 weeks or as directed by physician office. They may be removed at night for sleeping.  MEDICATIONS:  See your medication summary on the "After Visit Summary" that nursing will review with you.  You may have some home medications which will be placed on hold until you complete the course of blood thinner medication.  It is important for you to complete the blood thinner medication as prescribed.  PRECAUTIONS:  If you experience chest pain or shortness of breath - call 911 immediately for transfer to the hospital emergency department.   If you develop a fever greater that 101 F, purulent drainage from wound, increased redness or drainage from wound, foul odor from the wound/dressing, or calf pain - CONTACT YOUR SURGEON.                                                   FOLLOW-UP APPOINTMENTS:  If you do not already have a post-op appointment, please call the office for an appointment to be seen by your surgeon.  Guidelines for how soon to be seen are listed in your "After Visit Summary", but are typically between 1-4 weeks after surgery.  OTHER INSTRUCTIONS:   Knee Replacement:  Do not place pillow under knee, focus on keeping the knee straight while resting. CPM instructions: 0-90 degrees, 2 hours in the morning, 2 hours in the afternoon, and 2 hours in the evening. Place foam block, curve side up under  heel at all times except when in CPM or when walking.  DO NOT modify, tear, cut, or change the foam block in any way.   DENTAL ANTIBIOTICS:  In most cases prophylactic antibiotics for Dental procdeures after total joint surgery are not necessary.  Exceptions are as follows:  1. History of prior total joint infection  2. Severely immunocompromised (Organ Transplant, cancer chemotherapy, Rheumatoid biologic meds such as [place])  3. Poorly controlled diabetes (A1C &gt; 8.0, blood glucose over 200)  If you have one of these conditions, contact your surgeon for an antibiotic prescription, prior to your dental procedure.   MAKE SURE YOU:  Understand these instructions.  Get help right away if you are not doing well or get worse.    Thank you for letting us be a part of your medical care team.  It is a privilege we respect greatly.  We hope these instructions will help you stay on track for a fast and full recovery!   Increase activity slowly as tolerated   Complete by: As directed       Follow-up Information    Melrose Nakayama, MD. Go on 09/25/2019.   Specialty: Orthopedic Surgery Why: Your appointment is scheduled for 3:30  Contact information: [place] [address] [phone]        [facility] Follow up.   Specialty: [facility] Why: you will be seen for 5 HHPT visits prior to starting outpatient physical therapy  Contact information: [place] [id number] [phone]        [facility] Go on 09/22/2019.   Why: You are scheduled to start outpatient phyiscal therapy at 10:00. Please arrive at 9:30 to complete your paperwork  Contact information: Physical Therapy [place] [address] [phone]            Signed: Larwance Sachs Ridhima Golberg 09/13/2019, 8:07 AM

## 2019-09-13 NOTE — Progress Notes (Signed)
Physical Therapy Treatment Patient Details Name: Alexis Kramer MRN: [id number] DOB: 1940-12-09 Today's Date: 09/13/2019    History of Present Illness Patient is 79 y.o. female s/p Rt TKA on 09/12/19 with PMH significant for breast cancer and mastectomy.    PT Comments    Pt meeting PT goals; reviewed TKA HEP, pt tol well.  Ready for d/c from PT standpoint   Follow Up Recommendations  Follow surgeon's recommendation for DC plan and follow-up therapies     Equipment Recommendations  Rolling walker with 5" wheels;3in1 (PT)(youth walker)    Recommendations for Other Services       Precautions / Restrictions Precautions Precautions: Fall Restrictions Weight Bearing Restrictions: No Other Position/Activity Restrictions: WBAT    Mobility  Bed Mobility                  Transfers                    Ambulation/Gait                 Stairs             Wheelchair Mobility    Modified Rankin (Stroke Patients Only)       Balance                                            Cognition Arousal/Alertness: Awake/alert Behavior During Therapy: WFL for tasks assessed/performed Overall Cognitive Status: Within Functional Limits for tasks assessed                                        Exercises Total Joint Exercises Ankle Circles/Pumps: AROM;Both;15 reps;Seated Quad Sets: AROM;Right;5 reps;Seated Short Arc Quad: AROM;Right;10 reps Heel Slides: AROM;Right;10 reps;AAROM Hip ABduction/ADduction: AROM;Right;10 reps Straight Leg Raises: AAROM;Right;10 reps Goniometric ROM: ~6 to 65 degrees right knee flexion    General Comments        Pertinent Vitals/Pain Pain Location: Rt knee Pain Descriptors / Indicators: Aching;Discomfort    Home Living                      Prior Function            PT Goals (current goals can now be found in the care plan section) Acute Rehab PT Goals Patient Stated  Goal: to get back into her garden PT Goal Formulation: With patient Time For Goal Achievement: 09/19/19 Potential to Achieve Goals: Good Progress towards PT goals: Progressing toward goals    Frequency    7X/week      PT Plan Current plan remains appropriate    Co-evaluation              AM-PAC PT "6 Clicks" Mobility   Outcome Measure  Help needed turning from your back to your side while in a flat bed without using bedrails?: A Little Help needed moving from lying on your back to sitting on the side of a flat bed without using bedrails?: A Little Help needed moving to and from a bed to a chair (including a wheelchair)?: A Little Help needed standing up from a chair using your arms (e.g., wheelchair or bedside chair)?: A Little Help needed to walk in hospital room?: A Little Help needed climbing 3-5 steps with  a railing? : A Lot 6 Click Score: 17    End of Session Equipment Utilized During Treatment: Gait belt Activity Tolerance: Patient tolerated treatment well Patient left: with call bell/phone within reach;with family/visitor present;in bed;with bed alarm set Nurse Communication: Mobility status PT Visit Diagnosis: Muscle weakness (generalized) (M62.81);Difficulty in walking, not elsewhere classified (R26.2);Pain Pain - Right/Left: Right Pain - part of body: Knee     Time: [id number] PT Time Calculation (min) (ACUTE ONLY): 13 min  Charges:  $Therapeutic Exercise: 8-22 mins                     Baxter Flattery, PT   Acute Rehab Dept [facility]): [id number]   09/13/2019    [facility] 09/13/2019, 2:08 PM

## 2019-09-13 NOTE — Progress Notes (Signed)
Subjective: 1 Day Post-Op Procedure(s) (LRB): RIGHT TOTAL KNEE ARTHROPLASTY (Right)   Patient feeling well this morning. No significant pain.  Activity level:  wbat Diet tolerance:  ok Voiding:  ok Patient reports pain as mild.    Objective: Vital signs in last 24 hours: Temp:  [96.3 F (35.7 C)-98.4 F (36.9 C)] 97.9 F (36.6 C) (04/07 0525) Pulse Rate:  [53-62] 54 (04/07 0525) Resp:  [7-16] 12 (04/07 0525) BP: (98-136)/(46-76) 106/64 (04/07 0525) SpO2:  [99 %-100 %] 100 % (04/07 0525) Weight:  [60.5 kg] 60.5 kg (04/06 1142)  Labs: No results for input(s): HGB in the last 72 hours. No results for input(s): WBC, RBC, HCT, PLT in the last 72 hours. No results for input(s): NA, K, CL, CO2, BUN, CREATININE, GLUCOSE, CALCIUM in the last 72 hours. No results for input(s): LABPT, INR in the last 72 hours.  Physical Exam:  Neurologically intact ABD soft Neurovascular intact Sensation intact distally Intact pulses distally Dorsiflexion/Plantar flexion intact Incision: dressing C/D/I and no drainage No cellulitis present Compartment soft  Assessment/Plan:  1 Day Post-Op Procedure(s) (LRB): RIGHT TOTAL KNEE ARTHROPLASTY (Right) Advance diet Up with therapy Discharge home with home health today after PT. Continue on ASA 81mg  BID x 2 weeks post op for DVT prevention, Follow up in office 2 weeks post op.  Larwance Sachs Nimisha Rathel 09/13/2019, 8:02 AM

## 2019-09-13 NOTE — Plan of Care (Signed)
  Problem: Education: Goal: Knowledge of General Education information will improve Description: Including pain rating scale, medication(s)/side effects and non-pharmacologic comfort measures 09/13/2019 1437 by Hubert Azure, RN Outcome: Adequate for Discharge 09/13/2019 1436 by Hubert Azure, RN Outcome: Progressing   Problem: Health Behavior/Discharge Planning: Goal: Ability to manage health-related needs will improve 09/13/2019 1437 by Hubert Azure, RN Outcome: Adequate for Discharge 09/13/2019 1436 by Hubert Azure, RN Outcome: Progressing   Problem: Clinical Measurements: Goal: Ability to maintain clinical measurements within normal limits will improve 09/13/2019 1437 by Hubert Azure, RN Outcome: Adequate for Discharge 09/13/2019 1436 by Hubert Azure, RN Outcome: Progressing Goal: Will remain free from infection 09/13/2019 1437 by Hubert Azure, RN Outcome: Adequate for Discharge 09/13/2019 1436 by Hubert Azure, RN Outcome: Progressing Goal: Diagnostic test results will improve Outcome: Adequate for Discharge Goal: Respiratory complications will improve Outcome: Adequate for Discharge Goal: Cardiovascular complication will be avoided Outcome: Adequate for Discharge   Problem: Activity: Goal: Risk for activity intolerance will decrease Outcome: Adequate for Discharge   Problem: Nutrition: Goal: Adequate nutrition will be maintained Outcome: Adequate for Discharge   Problem: Coping: Goal: Level of anxiety will decrease Outcome: Adequate for Discharge   Problem: Elimination: Goal: Will not experience complications related to bowel motility Outcome: Adequate for Discharge Goal: Will not experience complications related to urinary retention Outcome: Adequate for Discharge   Problem: Pain Managment: Goal: General experience of comfort will improve Outcome: Adequate for Discharge   Problem: Safety: Goal: Ability to remain free from injury will  improve Outcome: Adequate for Discharge   Problem: Skin Integrity: Goal: Risk for impaired skin integrity will decrease Outcome: Adequate for Discharge

## 2019-09-13 NOTE — Progress Notes (Signed)
Physical Therapy Treatment Patient Details Name: Alexis Kramer MRN: [id number] DOB: 1940/11/07 Today's Date: 09/13/2019    History of Present Illness Patient is 79 y.o. female s/p Rt TKA on 09/12/19 with PMH significant for breast cancer and mastectomy.    PT Comments    Pt progressing toward PT goals, will see for a second session and should be ready for d/c later today   Follow Up Recommendations  Follow surgeon's recommendation for DC plan and follow-up therapies     Equipment Recommendations  Rolling walker with 5" wheels;3in1 (PT)(youth walker)    Recommendations for Other Services       Precautions / Restrictions Precautions Precautions: Fall Restrictions Weight Bearing Restrictions: No Other Position/Activity Restrictions: WBAT    Mobility  Bed Mobility Overal bed mobility: Needs Assistance Bed Mobility: Supine to Sit     Supine to sit: HOB elevated;Min guard     General bed mobility comments: incr time, min/guard to lower RLE to floor  Transfers Overall transfer level: Needs assistance Equipment used: Rolling walker (2 wheeled) Transfers: Sit to/from Stand Sit to Stand: Min assist;Min guard         General transfer comment: cues for hand placement and technique with RW. Assist required for power up and to steady with rising.   Ambulation/Gait Ambulation/Gait assistance: Min guard;Supervision Gait Distance (Feet): 120 Feet Assistive device: Rolling walker (2 wheeled) Gait Pattern/deviations: Step-to pattern;Decreased stance time - right;Decreased stride length;Decreased weight shift to right Gait velocity: decreased   General Gait Details: verbal cues for sequence and RW position   Stairs Stairs: Yes Stairs assistance: Min guard;Min assist Stair Management: One rail Right;Step to pattern;Sideways Number of Stairs: 3 General stair comments: cues for sequence and safe technique    Wheelchair Mobility    Modified Rankin (Stroke Patients  Only)       Balance Overall balance assessment: Needs assistance Sitting-balance support: Feet supported Sitting balance-Leahy Scale: Good     Standing balance support: Bilateral upper extremity supported;During functional activity Standing balance-Leahy Scale: Poor                              Cognition Arousal/Alertness: Awake/alert Behavior During Therapy: WFL for tasks assessed/performed Overall Cognitive Status: Within Functional Limits for tasks assessed                                        Exercises Total Joint Exercises Ankle Circles/Pumps: AROM;Both;15 reps;Seated Quad Sets: AROM;Right;5 reps;Seated Heel Slides: AROM;Right;10 reps    General Comments        Pertinent Vitals/Pain Pain Assessment: 0-10 Pain Score: 5  Pain Location: Rt knee Pain Descriptors / Indicators: Aching;Discomfort Pain Intervention(s): Limited activity within patient's tolerance;Monitored during session;Premedicated before session;Repositioned;Ice applied    Home Living                      Prior Function            PT Goals (current goals can now be found in the care plan section) Acute Rehab PT Goals Patient Stated Goal: to get back into her garden PT Goal Formulation: With patient Time For Goal Achievement: 09/19/19 Potential to Achieve Goals: Good Progress towards PT goals: Progressing toward goals    Frequency    7X/week      PT Plan  Co-evaluation              AM-PAC PT "6 Clicks" Mobility   Outcome Measure  Help needed turning from your back to your side while in a flat bed without using bedrails?: A Little Help needed moving from lying on your back to sitting on the side of a flat bed without using bedrails?: A Little Help needed moving to and from a bed to a chair (including a wheelchair)?: A Little Help needed standing up from a chair using your arms (e.g., wheelchair or bedside chair)?: A Little Help needed  to walk in hospital room?: A Little Help needed climbing 3-5 steps with a railing? : A Lot 6 Click Score: 17    End of Session Equipment Utilized During Treatment: Gait belt Activity Tolerance: Patient tolerated treatment well Patient left: in chair;with call bell/phone within reach;with chair alarm set;with family/visitor present Nurse Communication: Mobility status PT Visit Diagnosis: Muscle weakness (generalized) (M62.81);Difficulty in walking, not elsewhere classified (R26.2);Pain Pain - Right/Left: Right Pain - part of body: Knee     Time: [id number] PT Time Calculation (min) (ACUTE ONLY): 24 min  Charges:  $Gait Training: 23-37 mins                     Purvis Sidle, PT   Acute Rehab Dept [facility]): [id number]   09/13/2019    [facility] 09/13/2019, 10:00 AM

## 2019-09-13 NOTE — Progress Notes (Signed)
Alexis Montane pa notified of pt low urinary output as well as lower sbp in the 90's. Bolus ordered. Pt stable at this time resting well with no needs.

## 2019-10-27 ENCOUNTER — Other Ambulatory Visit: Payer: Self-pay | Admitting: Family Medicine

## 2019-10-27 DIAGNOSIS — Z1231 Encounter for screening mammogram for malignant neoplasm of breast: Secondary | ICD-10-CM

## 2019-11-10 ENCOUNTER — Other Ambulatory Visit: Payer: Self-pay

## 2019-11-10 ENCOUNTER — Ambulatory Visit
Admission: RE | Admit: 2019-11-10 | Discharge: 2019-11-10 | Disposition: A | Discharge status: Home / Self Care | Payer: Medicare Other | Source: Ambulatory Visit | Attending: Family Medicine | Admitting: Family Medicine

## 2019-11-10 DIAGNOSIS — Z1231 Encounter for screening mammogram for malignant neoplasm of breast: Secondary | ICD-10-CM

## 2020-08-27 DIAGNOSIS — K219 Gastro-esophageal reflux disease without esophagitis: Secondary | ICD-10-CM | POA: Diagnosis not present

## 2020-08-27 DIAGNOSIS — Z1159 Encounter for screening for other viral diseases: Secondary | ICD-10-CM | POA: Diagnosis not present

## 2020-08-27 DIAGNOSIS — E559 Vitamin D deficiency, unspecified: Secondary | ICD-10-CM | POA: Diagnosis not present

## 2020-08-27 DIAGNOSIS — E785 Hyperlipidemia, unspecified: Secondary | ICD-10-CM | POA: Diagnosis not present

## 2020-08-27 DIAGNOSIS — M8588 Other specified disorders of bone density and structure, other site: Secondary | ICD-10-CM | POA: Diagnosis not present

## 2020-08-27 DIAGNOSIS — F5101 Primary insomnia: Secondary | ICD-10-CM | POA: Diagnosis not present

## 2020-08-27 DIAGNOSIS — J309 Allergic rhinitis, unspecified: Secondary | ICD-10-CM | POA: Diagnosis not present

## 2020-08-27 DIAGNOSIS — Z0000 Encounter for general adult medical examination without abnormal findings: Secondary | ICD-10-CM | POA: Diagnosis not present

## 2020-08-29 ENCOUNTER — Other Ambulatory Visit: Payer: Self-pay | Admitting: Family Medicine

## 2020-08-29 DIAGNOSIS — Z1231 Encounter for screening mammogram for malignant neoplasm of breast: Secondary | ICD-10-CM

## 2020-08-29 DIAGNOSIS — M8580 Other specified disorders of bone density and structure, unspecified site: Secondary | ICD-10-CM

## 2020-11-11 ENCOUNTER — Ambulatory Visit: Payer: Medicare Other

## 2020-11-15 DIAGNOSIS — Z96651 Presence of right artificial knee joint: Secondary | ICD-10-CM | POA: Diagnosis not present

## 2020-11-15 DIAGNOSIS — M1712 Unilateral primary osteoarthritis, left knee: Secondary | ICD-10-CM | POA: Diagnosis not present

## 2020-11-19 DIAGNOSIS — H43393 Other vitreous opacities, bilateral: Secondary | ICD-10-CM | POA: Diagnosis not present

## 2020-11-19 DIAGNOSIS — H2513 Age-related nuclear cataract, bilateral: Secondary | ICD-10-CM | POA: Diagnosis not present

## 2021-01-03 ENCOUNTER — Other Ambulatory Visit: Payer: Self-pay

## 2021-01-03 ENCOUNTER — Ambulatory Visit
Admission: RE | Admit: 2021-01-03 | Discharge: 2021-01-03 | Disposition: A | Discharge status: Home / Self Care | Payer: Medicare Other | Source: Ambulatory Visit | Attending: Family Medicine | Admitting: Family Medicine

## 2021-01-03 DIAGNOSIS — Z1231 Encounter for screening mammogram for malignant neoplasm of breast: Secondary | ICD-10-CM

## 2021-01-14 DIAGNOSIS — H2511 Age-related nuclear cataract, right eye: Secondary | ICD-10-CM | POA: Diagnosis not present

## 2021-01-14 DIAGNOSIS — H18413 Arcus senilis, bilateral: Secondary | ICD-10-CM | POA: Diagnosis not present

## 2021-01-14 DIAGNOSIS — H26492 Other secondary cataract, left eye: Secondary | ICD-10-CM | POA: Diagnosis not present

## 2021-01-14 DIAGNOSIS — Z961 Presence of intraocular lens: Secondary | ICD-10-CM | POA: Diagnosis not present

## 2021-01-27 DIAGNOSIS — K219 Gastro-esophageal reflux disease without esophagitis: Secondary | ICD-10-CM | POA: Diagnosis not present

## 2021-01-27 DIAGNOSIS — Z853 Personal history of malignant neoplasm of breast: Secondary | ICD-10-CM | POA: Diagnosis not present

## 2021-01-27 DIAGNOSIS — M19072 Primary osteoarthritis, left ankle and foot: Secondary | ICD-10-CM | POA: Diagnosis not present

## 2021-01-27 DIAGNOSIS — E785 Hyperlipidemia, unspecified: Secondary | ICD-10-CM | POA: Diagnosis not present

## 2021-01-27 DIAGNOSIS — N182 Chronic kidney disease, stage 2 (mild): Secondary | ICD-10-CM | POA: Diagnosis not present

## 2021-01-27 DIAGNOSIS — M170 Bilateral primary osteoarthritis of knee: Secondary | ICD-10-CM | POA: Diagnosis not present

## 2021-01-27 DIAGNOSIS — M19071 Primary osteoarthritis, right ankle and foot: Secondary | ICD-10-CM | POA: Diagnosis not present

## 2021-02-03 DIAGNOSIS — H2513 Age-related nuclear cataract, bilateral: Secondary | ICD-10-CM | POA: Diagnosis not present

## 2021-02-03 DIAGNOSIS — H2511 Age-related nuclear cataract, right eye: Secondary | ICD-10-CM | POA: Diagnosis not present

## 2021-03-03 DIAGNOSIS — M1712 Unilateral primary osteoarthritis, left knee: Secondary | ICD-10-CM | POA: Diagnosis not present

## 2021-03-05 ENCOUNTER — Ambulatory Visit
Admission: RE | Admit: 2021-03-05 | Discharge: 2021-03-05 | Disposition: A | Discharge status: Home / Self Care | Payer: Medicare Other | Source: Ambulatory Visit | Attending: Family Medicine | Admitting: Family Medicine

## 2021-03-05 ENCOUNTER — Other Ambulatory Visit: Payer: Self-pay

## 2021-03-05 DIAGNOSIS — Z780 Asymptomatic menopausal state: Secondary | ICD-10-CM | POA: Diagnosis not present

## 2021-03-05 DIAGNOSIS — M8580 Other specified disorders of bone density and structure, unspecified site: Secondary | ICD-10-CM

## 2021-03-05 DIAGNOSIS — M85851 Other specified disorders of bone density and structure, right thigh: Secondary | ICD-10-CM | POA: Diagnosis not present

## 2021-03-15 DIAGNOSIS — H20031 Secondary infectious iridocyclitis, right eye: Secondary | ICD-10-CM | POA: Diagnosis not present

## 2021-04-01 DIAGNOSIS — H34812 Central retinal vein occlusion, left eye, with macular edema: Secondary | ICD-10-CM | POA: Diagnosis not present

## 2021-06-14 DIAGNOSIS — M25562 Pain in left knee: Secondary | ICD-10-CM | POA: Diagnosis not present

## 2021-06-18 DIAGNOSIS — L820 Inflamed seborrheic keratosis: Secondary | ICD-10-CM | POA: Diagnosis not present

## 2021-09-02 DIAGNOSIS — K219 Gastro-esophageal reflux disease without esophagitis: Secondary | ICD-10-CM | POA: Diagnosis not present

## 2021-09-02 DIAGNOSIS — F5101 Primary insomnia: Secondary | ICD-10-CM | POA: Diagnosis not present

## 2021-09-02 DIAGNOSIS — E559 Vitamin D deficiency, unspecified: Secondary | ICD-10-CM | POA: Diagnosis not present

## 2021-09-02 DIAGNOSIS — R3915 Urgency of urination: Secondary | ICD-10-CM | POA: Diagnosis not present

## 2021-09-02 DIAGNOSIS — N182 Chronic kidney disease, stage 2 (mild): Secondary | ICD-10-CM | POA: Diagnosis not present

## 2021-09-02 DIAGNOSIS — Z0000 Encounter for general adult medical examination without abnormal findings: Secondary | ICD-10-CM | POA: Diagnosis not present

## 2021-09-02 DIAGNOSIS — E785 Hyperlipidemia, unspecified: Secondary | ICD-10-CM | POA: Diagnosis not present

## 2021-09-02 DIAGNOSIS — Z853 Personal history of malignant neoplasm of breast: Secondary | ICD-10-CM | POA: Diagnosis not present

## 2021-09-02 DIAGNOSIS — J309 Allergic rhinitis, unspecified: Secondary | ICD-10-CM | POA: Diagnosis not present

## 2021-09-02 DIAGNOSIS — H9193 Unspecified hearing loss, bilateral: Secondary | ICD-10-CM | POA: Diagnosis not present

## 2021-09-08 DIAGNOSIS — M65331 Trigger finger, right middle finger: Secondary | ICD-10-CM | POA: Diagnosis not present

## 2021-10-08 DIAGNOSIS — H903 Sensorineural hearing loss, bilateral: Secondary | ICD-10-CM | POA: Diagnosis not present

## 2021-10-16 DIAGNOSIS — K5904 Chronic idiopathic constipation: Secondary | ICD-10-CM | POA: Diagnosis not present

## 2021-10-16 DIAGNOSIS — Z86010 Personal history of colonic polyps: Secondary | ICD-10-CM | POA: Diagnosis not present

## 2021-10-16 DIAGNOSIS — K219 Gastro-esophageal reflux disease without esophagitis: Secondary | ICD-10-CM | POA: Diagnosis not present

## 2021-10-16 DIAGNOSIS — K601 Chronic anal fissure: Secondary | ICD-10-CM | POA: Diagnosis not present

## 2021-10-16 DIAGNOSIS — K5730 Diverticulosis of large intestine without perforation or abscess without bleeding: Secondary | ICD-10-CM | POA: Diagnosis not present

## 2021-10-29 ENCOUNTER — Emergency Department (HOSPITAL_COMMUNITY)
Admission: EM | Admit: 2021-10-29 | Discharge: 2021-10-29 | Disposition: A | Discharge status: Home / Self Care | Payer: Medicare Other | Attending: Emergency Medicine | Admitting: Emergency Medicine

## 2021-10-29 ENCOUNTER — Encounter (HOSPITAL_COMMUNITY): Payer: Self-pay

## 2021-10-29 ENCOUNTER — Emergency Department (HOSPITAL_COMMUNITY): Payer: Medicare Other

## 2021-10-29 ENCOUNTER — Other Ambulatory Visit: Payer: Self-pay

## 2021-10-29 DIAGNOSIS — S50811A Abrasion of right forearm, initial encounter: Secondary | ICD-10-CM | POA: Insufficient documentation

## 2021-10-29 DIAGNOSIS — S59911A Unspecified injury of right forearm, initial encounter: Secondary | ICD-10-CM | POA: Diagnosis present

## 2021-10-29 DIAGNOSIS — M79603 Pain in arm, unspecified: Secondary | ICD-10-CM | POA: Diagnosis not present

## 2021-10-29 DIAGNOSIS — Z743 Need for continuous supervision: Secondary | ICD-10-CM | POA: Diagnosis not present

## 2021-10-29 DIAGNOSIS — M79631 Pain in right forearm: Secondary | ICD-10-CM | POA: Diagnosis not present

## 2021-10-29 DIAGNOSIS — R6889 Other general symptoms and signs: Secondary | ICD-10-CM | POA: Diagnosis not present

## 2021-10-29 DIAGNOSIS — Z7982 Long term (current) use of aspirin: Secondary | ICD-10-CM | POA: Insufficient documentation
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
## 2021-10-29 NOTE — Discharge Instructions (Signed)
Like we discussed, you can continue to take Tylenol as well as Motrin for management of your pain.  Please follow the instructions on the bottles.  I would also recommend the application of ice to the right forearm 1-2 times per day.  This will help with pain as well as inflammation.  Also consider light stretching.  If you develop any new or worsening symptoms whatsoever please come back to the emergency department for reevaluation.

## 2021-11-05 DIAGNOSIS — S5011XD Contusion of right forearm, subsequent encounter: Secondary | ICD-10-CM | POA: Diagnosis not present

## 2021-11-05 DIAGNOSIS — L989 Disorder of the skin and subcutaneous tissue, unspecified: Secondary | ICD-10-CM | POA: Diagnosis not present

## 2021-11-05 DIAGNOSIS — F5101 Primary insomnia: Secondary | ICD-10-CM | POA: Diagnosis not present

## 2021-11-05 DIAGNOSIS — M25561 Pain in right knee: Secondary | ICD-10-CM | POA: Diagnosis not present

## 2021-11-13 DIAGNOSIS — K5730 Diverticulosis of large intestine without perforation or abscess without bleeding: Secondary | ICD-10-CM | POA: Diagnosis not present

## 2021-11-13 DIAGNOSIS — Z86010 Personal history of colonic polyps: Secondary | ICD-10-CM | POA: Diagnosis not present

## 2021-11-13 DIAGNOSIS — K219 Gastro-esophageal reflux disease without esophagitis: Secondary | ICD-10-CM | POA: Diagnosis not present

## 2021-11-13 DIAGNOSIS — K601 Chronic anal fissure: Secondary | ICD-10-CM | POA: Diagnosis not present

## 2021-11-21 DIAGNOSIS — B078 Other viral warts: Secondary | ICD-10-CM | POA: Diagnosis not present

## 2021-11-25 ENCOUNTER — Other Ambulatory Visit: Payer: Self-pay | Admitting: Family Medicine

## 2021-11-25 DIAGNOSIS — Z1231 Encounter for screening mammogram for malignant neoplasm of breast: Secondary | ICD-10-CM

## 2022-01-06 ENCOUNTER — Ambulatory Visit
Admission: RE | Admit: 2022-01-06 | Discharge: 2022-01-06 | Disposition: A | Discharge status: Home / Self Care | Payer: Medicare Other | Source: Ambulatory Visit | Attending: Family Medicine | Admitting: Family Medicine

## 2022-01-06 DIAGNOSIS — Z1231 Encounter for screening mammogram for malignant neoplasm of breast: Secondary | ICD-10-CM | POA: Diagnosis not present

## 2022-01-07 DIAGNOSIS — M1712 Unilateral primary osteoarthritis, left knee: Secondary | ICD-10-CM | POA: Diagnosis not present

## 2022-01-09 DIAGNOSIS — B078 Other viral warts: Secondary | ICD-10-CM | POA: Diagnosis not present

## 2022-01-09 DIAGNOSIS — D485 Neoplasm of uncertain behavior of skin: Secondary | ICD-10-CM | POA: Diagnosis not present

## 2022-01-09 DIAGNOSIS — L281 Prurigo nodularis: Secondary | ICD-10-CM | POA: Diagnosis not present

## 2022-03-10 DIAGNOSIS — H43393 Other vitreous opacities, bilateral: Secondary | ICD-10-CM | POA: Diagnosis not present

## 2022-03-24 DIAGNOSIS — L280 Lichen simplex chronicus: Secondary | ICD-10-CM | POA: Diagnosis not present

## 2022-03-24 DIAGNOSIS — B078 Other viral warts: Secondary | ICD-10-CM | POA: Diagnosis not present

## 2022-04-07 DIAGNOSIS — R350 Frequency of micturition: Secondary | ICD-10-CM | POA: Diagnosis not present

## 2022-04-07 DIAGNOSIS — M7989 Other specified soft tissue disorders: Secondary | ICD-10-CM | POA: Diagnosis not present

## 2022-04-07 DIAGNOSIS — F5101 Primary insomnia: Secondary | ICD-10-CM | POA: Diagnosis not present

## 2022-05-06 DIAGNOSIS — M1712 Unilateral primary osteoarthritis, left knee: Secondary | ICD-10-CM | POA: Diagnosis not present

## 2022-06-23 DIAGNOSIS — R2231 Localized swelling, mass and lump, right upper limb: Secondary | ICD-10-CM | POA: Diagnosis not present

## 2022-06-23 DIAGNOSIS — M65331 Trigger finger, right middle finger: Secondary | ICD-10-CM | POA: Diagnosis not present

## 2022-07-22 DIAGNOSIS — R2231 Localized swelling, mass and lump, right upper limb: Secondary | ICD-10-CM | POA: Diagnosis not present

## 2022-07-24 ENCOUNTER — Other Ambulatory Visit: Payer: Self-pay | Admitting: Physician Assistant

## 2022-07-24 DIAGNOSIS — R2231 Localized swelling, mass and lump, right upper limb: Secondary | ICD-10-CM
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
## 2022-07-28 ENCOUNTER — Ambulatory Visit
Admission: RE | Admit: 2022-07-28 | Discharge: 2022-07-28 | Disposition: A | Discharge status: Home / Self Care | Payer: Medicare Other | Source: Ambulatory Visit | Attending: Physician Assistant | Admitting: Physician Assistant

## 2022-07-28 DIAGNOSIS — R2231 Localized swelling, mass and lump, right upper limb: Secondary | ICD-10-CM

## 2022-08-05 DIAGNOSIS — R2231 Localized swelling, mass and lump, right upper limb: Secondary | ICD-10-CM | POA: Diagnosis not present

## 2022-08-14 DIAGNOSIS — L280 Lichen simplex chronicus: Secondary | ICD-10-CM | POA: Diagnosis not present

## 2022-08-14 DIAGNOSIS — L821 Other seborrheic keratosis: Secondary | ICD-10-CM | POA: Diagnosis not present

## 2022-08-14 DIAGNOSIS — D2261 Melanocytic nevi of right upper limb, including shoulder: Secondary | ICD-10-CM | POA: Diagnosis not present

## 2022-08-14 DIAGNOSIS — D485 Neoplasm of uncertain behavior of skin: Secondary | ICD-10-CM | POA: Diagnosis not present

## 2022-09-16 ENCOUNTER — Encounter (HOSPITAL_COMMUNITY): Payer: Self-pay

## 2022-09-16 ENCOUNTER — Ambulatory Visit (HOSPITAL_COMMUNITY)
Admission: RE | Admit: 2022-09-16 | Discharge: 2022-09-16 | Disposition: A | Discharge status: Home / Self Care | Payer: Medicare Other | Source: Ambulatory Visit | Attending: Internal Medicine | Admitting: Internal Medicine

## 2022-09-16 VITALS — BP 129/85 | HR 79 | Temp 97.9°F | Resp 18 | Ht 60.0 in | Wt 130.0 lb

## 2022-09-16 DIAGNOSIS — N3001 Acute cystitis with hematuria: Secondary | ICD-10-CM

## 2022-09-16 DIAGNOSIS — R112 Nausea with vomiting, unspecified: Secondary | ICD-10-CM | POA: Diagnosis not present

## 2022-09-16 DIAGNOSIS — R1032 Left lower quadrant pain: Secondary | ICD-10-CM

## 2022-09-16 LAB — POCT URINALYSIS DIPSTICK, ED / UC
Bilirubin Urine: NEGATIVE
Glucose, UA: NEGATIVE mg/dL
Ketones, ur: NEGATIVE mg/dL
Nitrite: NEGATIVE
Protein, ur: NEGATIVE mg/dL
Specific Gravity, Urine: 1.015 (ref 1.005–1.030)
Urobilinogen, UA: 0.2 mg/dL (ref 0.0–1.0)
pH: 7.5 (ref 5.0–8.0)

## 2022-09-16 MED ORDER — ONDANSETRON 4 MG PO TBDP: 4.0000 mg | ORAL_TABLET | Freq: Three times a day (TID) | ORAL | 0 refills | Status: DC | PRN

## 2022-09-16 MED ORDER — CEPHALEXIN 500 MG PO CAPS: 500.0000 mg | ORAL_CAPSULE | Freq: Two times a day (BID) | ORAL | 0 refills | Status: DC

## 2022-09-16 NOTE — Discharge Instructions (Signed)
Your urine shows you likely have a urinary tract infection. I have sent your urine for culture to confirm this. We will go ahead and have you start taking antibiotics due to your symptoms.  Take antibiotic as directed.  (Keflex 500mg  every 12 hours for 7 days) If you develop diarrhea while taking this medication you may purchase an over-the-counter probiotic or eat yogurt with live active cultures.  To avoid GI upset please take this medication with food. I have sent your urine for culture to see what type of bacteria grows. We will call you if we need to change the treatment plan based on the results of your urine culture.  Take zofran 4mg  every 8 hours as needed for nausea and vomiting. Increase your fluid intake to stay well hydrated.  Eat bland foods until you can keep them down without vomiting, then increase diet as tolerated.  If you develop any new or worsening symptoms or do not improve in the next 2 to 3 days, please return.  If your symptoms are severe, please go to the emergency room.  Follow-up with your primary care provider for further evaluation and management of your symptoms as well as ongoing wellness visits.  I hope you feel better!

## 2022-09-16 NOTE — ED Provider Notes (Signed)
[facility]    CSN: [id number] Arrival date & time: 09/16/22  1230      History   Chief Complaint Chief Complaint  Patient presents with   Appointment    Emesis, abd pain.     HPI Alexis Kramer is a 82 y.o. female.   Patient presents to urgent care for evaluation of nausea, vomiting, and left lower quadrant abdominal pain that started at 2am this morning. Last night, she started noticing that she is using the restroom to urinate more frequently but denies dysuria, flank pain, and urinary hesitancy. No diarrhea. No one else has been sick with similar symptoms around her. History of hysterectomy and appendectomy "a long time ago". She also thinks she had her gallbladder out a long time ago as well. No recent blood/mucous to the stools. She was constipated last night, but states she finally had a BM last night but  the stool "wasn't as loose as it should be". Denies pain/straining with defecation. Denies recent antibiotic/steroid use and recent medications changes. No recent sore throat, rash, or viral URI symptoms. She has a history of frequent urinary tract infections but has not had a UTI in 6 months. She cannot identify any triggering or relieving factors for abdominal discomfort. Left lower abdominal pain is currently 5 on a scale of 0-10 and does not travel. She took some peptobismol earlier this morning without much relief of symptoms. Not currently nauseous.      Past Medical History:  Diagnosis Date   Breast cancer    Cancer     Patient Active Problem List   Diagnosis Date Noted   Primary osteoarthritis of right knee 09/12/2019    Past Surgical History:  Procedure Laterality Date   ABDOMINAL HYSTERECTOMY     APPENDECTOMY     MASTECTOMY Left 1997   TONSILLECTOMY     TOTAL KNEE ARTHROPLASTY Right 09/12/2019   Procedure: RIGHT TOTAL KNEE ARTHROPLASTY;  Surgeon: Marcene Corning, MD;  Location: [facility];  Service: Orthopedics;  Laterality: Right;    OB History    No obstetric history on file.      Home Medications    Prior to Admission medications   Medication Sig Start Date End Date Taking? Authorizing Provider  aspirin 81 MG chewable tablet Chew 1 tablet (81 mg total) by mouth 2 (two) times daily. 09/13/19  Yes Elodia Florence, PA-C  ondansetron (ZOFRAN-ODT) 4 MG disintegrating tablet Take 1 tablet (4 mg total) by mouth every 8 (eight) hours as needed for nausea or vomiting. 09/16/22  Yes Carlisle Beers, FNP  pravastatin (PRAVACHOL) 80 MG tablet Take 80 mg by mouth daily. 08/24/19  Yes [provider]  sertraline (ZOLOFT) 50 MG tablet Take 50 mg by mouth 3 (three) times a week. 06/04/19  Yes [provider]  traZODone (DESYREL) 100 MG tablet Take 100 mg by mouth at bedtime. 06/16/19  Yes [provider]  cephALEXin (KEFLEX) 500 MG capsule Take 1 capsule (500 mg total) by mouth 2 (two) times daily. 09/16/22   Carlisle Beers, FNP  HYDROcodone-acetaminophen (NORCO/VICODIN) 5-325 MG tablet Take 1-2 tablets by mouth every 6 (six) hours as needed for moderate pain or severe pain (post op pain). 09/13/19   Elodia Florence, PA-C  traMADol (ULTRAM) 50 MG tablet Take 50 mg by mouth daily as needed for pain. 08/26/19   [provider]    Family History Family History  Problem Relation Age of Onset   Breast cancer Sister 80  Breast cancer Sister     Social History Social History   Tobacco Use   Smoking status: Never   Smokeless tobacco: Never  Vaping Use   Vaping Use: Never used  Substance Use Topics   Alcohol use: Never   Drug use: Never     Allergies   Patient has no known allergies.   Review of Systems Review of Systems Per HPI  Physical Exam Triage Vital Signs ED Triage Vitals  Enc Vitals Group     BP 09/16/22 1302 129/85     Pulse Rate 09/16/22 1302 79     Resp 09/16/22 1302 18     Temp 09/16/22 1302 97.9 F (36.6 C)     Temp Source 09/16/22 1302 Oral     SpO2 09/16/22 1302 97 %      Weight 09/16/22 1301 130 lb (59 kg)     Height 09/16/22 1301 5' (1.524 m)     Head Circumference --      Peak Flow --      Pain Score 09/16/22 1300 9     Pain Loc --      Pain Edu? --      Excl. in GC? --    No data found.  Updated Vital Signs BP 129/85 (BP Location: Right Arm)   Pulse 79   Temp 97.9 F (36.6 C) (Oral)   Resp 18   Ht 5' (1.524 m)   Wt 130 lb (59 kg)   SpO2 97%   BMI 25.39 kg/m   Visual Acuity Right Eye Distance:   Left Eye Distance:   Bilateral Distance:    Right Eye Near:   Left Eye Near:    Bilateral Near:     Physical Exam Vitals and nursing note reviewed.  Constitutional:      Appearance: She is not ill-appearing or toxic-appearing.  HENT:     Head: Normocephalic and atraumatic.     Right Ear: Hearing, tympanic membrane, ear canal and external ear normal.     Left Ear: Hearing, tympanic membrane, ear canal and external ear normal.     Nose: Nose normal.     Mouth/Throat:     Lips: Pink.     Mouth: Mucous membranes are moist. No injury.     Tongue: No lesions. Tongue does not deviate from midline.     Palate: No mass and lesions.     Pharynx: Oropharynx is clear. Uvula midline. No pharyngeal swelling, oropharyngeal exudate, posterior oropharyngeal erythema or uvula swelling.     Tonsils: No tonsillar exudate or tonsillar abscesses.  Eyes:     General: Lids are normal. Vision grossly intact. Gaze aligned appropriately.        Right eye: No discharge.        Left eye: No discharge.     Extraocular Movements: Extraocular movements intact.     Conjunctiva/sclera: Conjunctivae normal.     Pupils: Pupils are equal, round, and reactive to light.  Cardiovascular:     Rate and Rhythm: Normal rate and regular rhythm.     Heart sounds: Normal heart sounds, S1 normal and S2 normal.  Pulmonary:     Effort: Pulmonary effort is normal. No respiratory distress.     Breath sounds: Normal breath sounds and air entry. No stridor. No wheezing, rhonchi or  rales.  Chest:     Chest wall: No tenderness.  Abdominal:     General: Abdomen is flat. Bowel sounds are normal. There is no distension.  Palpations: Abdomen is soft.     Tenderness: There is abdominal tenderness in the suprapubic area and left lower quadrant. There is no right CVA tenderness, left CVA tenderness, guarding or rebound.     Comments: No peritoneal signs elicited to abdominal exam.  Musculoskeletal:     Cervical back: Neck supple.     Right lower leg: No edema.     Left lower leg: No edema.  Lymphadenopathy:     Cervical: No cervical adenopathy.  Skin:    General: Skin is warm and dry.     Capillary Refill: Capillary refill takes less than 2 seconds.     Findings: No rash.  Neurological:     General: No focal deficit present.     Mental Status: She is alert and oriented to person, place, and time. Mental status is at baseline.     Cranial Nerves: No dysarthria or facial asymmetry.     Motor: No weakness.     Gait: Gait normal.  Psychiatric:        Mood and Affect: Mood normal.        Speech: Speech normal.        Behavior: Behavior normal.        Thought Content: Thought content normal.        Judgment: Judgment normal.      UC Treatments / Results  Labs (all labs ordered are listed, but only abnormal results are displayed) Labs Reviewed  POCT URINALYSIS DIPSTICK, ED / UC - Abnormal; Notable for the following components:      Result Value   Hgb urine dipstick TRACE (*)    Leukocytes,Ua SMALL (*)    All other components within normal limits  URINE CULTURE    EKG   Radiology No results found.  Procedures Procedures (including critical care time)  Medications Ordered in UC Medications - No data to display  Initial Impression / Assessment and Plan / UC Course  I have reviewed the triage vital signs and the nursing notes.  Pertinent labs & imaging results that were available during my care of the patient were reviewed by me and considered in my  medical decision making (see chart for details).   1.  Acute cystitis with hematuria, nausea and vomiting, abdominal pain left lower quadrant Presentation is consistent with acute uncomplicated cystitis as urinalysis is positive for trace hemoglobin and trace leukocytes in the setting of urinary symptoms with nausea and vomiting.  Nontender to bilateral CVA.  Low suspicion for pyelonephritis.  She is not currently experiencing any systemic symptoms.  Abdominal exam is without peritoneal signs.  She may have an underlying viral gastroenteritis contributing to nausea and vomiting, however suspect this is likely related to cystitis. She is not allergic to any antibiotics.  Keflex sent to pharmacy to be taken as prescribed. Advised to increase water intake to stay well hydrated. Zofran 4mg  ODT every 8 hours as needed for nausea and vomiting. Bland diet for the next 12 to 24 hours encourage, then may increase diet as tolerated.  She is overall nontoxic in appearance with hemodynamically stable vital signs.  She has a PCP appointment in approximately 6 to 7 days and plans to keep this appointment to follow-up.   Discussed physical exam and available lab work findings in clinic with patient.  Counseled patient regarding appropriate use of medications and potential side effects for all medications recommended or prescribed today. Discussed red flag signs and symptoms of worsening condition,when to call the PCP  office, return to urgent care, and when to seek higher level of care in the emergency department. Patient verbalizes understanding and agreement with plan. All questions answered. Patient discharged in stable condition.    Final Clinical Impressions(s) / UC Diagnoses   Final diagnoses:  Acute cystitis with hematuria  Nausea and vomiting, unspecified vomiting type  Abdominal pain, left lower quadrant     Discharge Instructions      Your urine shows you likely have a urinary tract infection. I have  sent your urine for culture to confirm this. We will go ahead and have you start taking antibiotics due to your symptoms.  Take antibiotic as directed.  (Keflex 500mg  every 12 hours for 7 days) If you develop diarrhea while taking this medication you may purchase an over-the-counter probiotic or eat yogurt with live active cultures.  To avoid GI upset please take this medication with food. I have sent your urine for culture to see what type of bacteria grows. We will call you if we need to change the treatment plan based on the results of your urine culture.  Take zofran 4mg  every 8 hours as needed for nausea and vomiting. Increase your fluid intake to stay well hydrated.  Eat bland foods until you can keep them down without vomiting, then increase diet as tolerated.  If you develop any new or worsening symptoms or do not improve in the next 2 to 3 days, please return.  If your symptoms are severe, please go to the emergency room.  Follow-up with your primary care provider for further evaluation and management of your symptoms as well as ongoing wellness visits.  I hope you feel better!     ED Prescriptions     Medication Sig Dispense Auth. Provider   cephALEXin (KEFLEX) 500 MG capsule Take 1 capsule (500 mg total) by mouth 2 (two) times daily. 14 capsule Reita May M, FNP   ondansetron (ZOFRAN-ODT) 4 MG disintegrating tablet Take 1 tablet (4 mg total) by mouth every 8 (eight) hours as needed for nausea or vomiting. 20 tablet Carlisle Beers, FNP      PDMP not reviewed this encounter.   Carlisle Beers, [place] 09/16/22 1504

## 2022-09-16 NOTE — ED Triage Notes (Signed)
Abdominal pain on the LLQ and emesis. Onset this morning. No cough or fever, or runny nose. No diarrhea and slight, resolved constipation.   No new food or meds. No one around Patient with the same symptoms. No known sick exposure.

## 2022-09-22 DIAGNOSIS — F5101 Primary insomnia: Secondary | ICD-10-CM | POA: Diagnosis not present

## 2022-09-22 DIAGNOSIS — N182 Chronic kidney disease, stage 2 (mild): Secondary | ICD-10-CM | POA: Diagnosis not present

## 2022-09-22 DIAGNOSIS — M8588 Other specified disorders of bone density and structure, other site: Secondary | ICD-10-CM | POA: Diagnosis not present

## 2022-09-22 DIAGNOSIS — E559 Vitamin D deficiency, unspecified: Secondary | ICD-10-CM | POA: Diagnosis not present

## 2022-09-22 DIAGNOSIS — J309 Allergic rhinitis, unspecified: Secondary | ICD-10-CM | POA: Diagnosis not present

## 2022-09-22 DIAGNOSIS — Z0000 Encounter for general adult medical examination without abnormal findings: Secondary | ICD-10-CM | POA: Diagnosis not present

## 2022-09-22 DIAGNOSIS — M1712 Unilateral primary osteoarthritis, left knee: Secondary | ICD-10-CM | POA: Diagnosis not present

## 2022-09-22 DIAGNOSIS — E785 Hyperlipidemia, unspecified: Secondary | ICD-10-CM | POA: Diagnosis not present

## 2022-09-22 DIAGNOSIS — N3090 Cystitis, unspecified without hematuria: Secondary | ICD-10-CM | POA: Diagnosis not present

## 2022-09-22 DIAGNOSIS — K219 Gastro-esophageal reflux disease without esophagitis: Secondary | ICD-10-CM | POA: Diagnosis not present

## 2022-10-01 DIAGNOSIS — K6289 Other specified diseases of anus and rectum: Secondary | ICD-10-CM | POA: Diagnosis not present

## 2022-10-01 DIAGNOSIS — K602 Anal fissure, unspecified: Secondary | ICD-10-CM | POA: Diagnosis not present

## 2022-10-01 DIAGNOSIS — K5900 Constipation, unspecified: Secondary | ICD-10-CM | POA: Diagnosis not present

## 2022-10-01 DIAGNOSIS — K219 Gastro-esophageal reflux disease without esophagitis: Secondary | ICD-10-CM | POA: Diagnosis not present

## 2022-10-01 DIAGNOSIS — Z86010 Personal history of colonic polyps: Secondary | ICD-10-CM | POA: Diagnosis not present

## 2022-10-01 DIAGNOSIS — R1310 Dysphagia, unspecified: Secondary | ICD-10-CM | POA: Diagnosis not present

## 2022-10-01 DIAGNOSIS — K625 Hemorrhage of anus and rectum: Secondary | ICD-10-CM | POA: Diagnosis not present

## 2022-10-01 DIAGNOSIS — K5730 Diverticulosis of large intestine without perforation or abscess without bleeding: Secondary | ICD-10-CM | POA: Diagnosis not present

## 2022-10-12 DIAGNOSIS — H04123 Dry eye syndrome of bilateral lacrimal glands: Secondary | ICD-10-CM | POA: Diagnosis not present

## 2022-10-12 DIAGNOSIS — H1012 Acute atopic conjunctivitis, left eye: Secondary | ICD-10-CM | POA: Diagnosis not present

## 2022-11-12 DIAGNOSIS — H04123 Dry eye syndrome of bilateral lacrimal glands: Secondary | ICD-10-CM | POA: Diagnosis not present

## 2022-11-12 DIAGNOSIS — H1012 Acute atopic conjunctivitis, left eye: Secondary | ICD-10-CM | POA: Diagnosis not present

## 2022-11-19 DIAGNOSIS — M1712 Unilateral primary osteoarthritis, left knee: Secondary | ICD-10-CM | POA: Diagnosis not present

## 2022-11-19 DIAGNOSIS — M898X6 Other specified disorders of bone, lower leg: Secondary | ICD-10-CM | POA: Diagnosis not present

## 2022-12-01 ENCOUNTER — Other Ambulatory Visit: Payer: Self-pay | Admitting: Family Medicine

## 2022-12-01 DIAGNOSIS — Z1231 Encounter for screening mammogram for malignant neoplasm of breast: Secondary | ICD-10-CM

## 2023-01-11 ENCOUNTER — Other Ambulatory Visit: Payer: Self-pay | Admitting: Family Medicine

## 2023-01-11 ENCOUNTER — Ambulatory Visit: Admission: RE | Admit: 2023-01-11 | Payer: Medicare Other | Source: Ambulatory Visit

## 2023-01-11 DIAGNOSIS — Z1231 Encounter for screening mammogram for malignant neoplasm of breast: Secondary | ICD-10-CM

## 2023-02-26 ENCOUNTER — Other Ambulatory Visit: Payer: Self-pay

## 2023-02-26 ENCOUNTER — Emergency Department (HOSPITAL_BASED_OUTPATIENT_CLINIC_OR_DEPARTMENT_OTHER): Payer: Medicare Other

## 2023-02-26 ENCOUNTER — Emergency Department (HOSPITAL_BASED_OUTPATIENT_CLINIC_OR_DEPARTMENT_OTHER)
Admission: EM | Admit: 2023-02-26 | Discharge: 2023-02-27 | Disposition: A | Discharge status: Home / Self Care | Payer: Medicare Other | Attending: Emergency Medicine | Admitting: Emergency Medicine

## 2023-02-26 ENCOUNTER — Emergency Department (HOSPITAL_BASED_OUTPATIENT_CLINIC_OR_DEPARTMENT_OTHER): Payer: Medicare Other | Admitting: Radiology

## 2023-02-26 DIAGNOSIS — Z853 Personal history of malignant neoplasm of breast: Secondary | ICD-10-CM | POA: Insufficient documentation

## 2023-02-26 DIAGNOSIS — I700 Atherosclerosis of aorta: Secondary | ICD-10-CM | POA: Diagnosis not present

## 2023-02-26 DIAGNOSIS — D352 Benign neoplasm of pituitary gland: Secondary | ICD-10-CM | POA: Diagnosis not present

## 2023-02-26 DIAGNOSIS — J9811 Atelectasis: Secondary | ICD-10-CM | POA: Insufficient documentation

## 2023-02-26 DIAGNOSIS — R9431 Abnormal electrocardiogram [ECG] [EKG]: Secondary | ICD-10-CM | POA: Diagnosis not present

## 2023-02-26 DIAGNOSIS — R918 Other nonspecific abnormal finding of lung field: Secondary | ICD-10-CM | POA: Diagnosis not present

## 2023-02-26 DIAGNOSIS — I6529 Occlusion and stenosis of unspecified carotid artery: Secondary | ICD-10-CM | POA: Diagnosis not present

## 2023-02-26 DIAGNOSIS — Z7982 Long term (current) use of aspirin: Secondary | ICD-10-CM | POA: Insufficient documentation

## 2023-02-26 DIAGNOSIS — H5462 Unqualified visual loss, left eye, normal vision right eye: Secondary | ICD-10-CM | POA: Insufficient documentation

## 2023-02-26 DIAGNOSIS — H547 Unspecified visual loss: Secondary | ICD-10-CM | POA: Diagnosis not present

## 2023-02-26 DIAGNOSIS — H53462 Homonymous bilateral field defects, left side: Secondary | ICD-10-CM | POA: Diagnosis not present

## 2023-02-26 DIAGNOSIS — E236 Other disorders of pituitary gland: Secondary | ICD-10-CM

## 2023-02-26 DIAGNOSIS — R9089 Other abnormal findings on diagnostic imaging of central nervous system: Secondary | ICD-10-CM | POA: Diagnosis not present

## 2023-02-26 DIAGNOSIS — G939 Disorder of brain, unspecified: Secondary | ICD-10-CM | POA: Diagnosis not present

## 2023-02-26 LAB — CBC WITH DIFFERENTIAL/PLATELET
Abs Immature Granulocytes: 0.02 10*3/uL (ref 0.00–0.07)
Basophils Absolute: 0.1 10*3/uL (ref 0.0–0.1)
Basophils Relative: 1 %
Eosinophils Absolute: 0.3 10*3/uL (ref 0.0–0.5)
Eosinophils Relative: 3 %
HCT: 42.2 % (ref 36.0–46.0)
Hemoglobin: 14.4 g/dL (ref 12.0–15.0)
Immature Granulocytes: 0 %
Lymphocytes Relative: 47 %
Lymphs Abs: 4.3 10*3/uL — ABNORMAL HIGH (ref 0.7–4.0)
MCH: 31.2 pg (ref 26.0–34.0)
MCHC: 34.1 g/dL (ref 30.0–36.0)
MCV: 91.3 fL (ref 80.0–100.0)
Monocytes Absolute: 0.7 10*3/uL (ref 0.1–1.0)
Monocytes Relative: 7 %
Neutro Abs: 3.8 10*3/uL (ref 1.7–7.7)
Neutrophils Relative %: 42 %
Platelets: 203 10*3/uL (ref 150–400)
RBC: 4.62 MIL/uL (ref 3.87–5.11)
RDW: 14.1 % (ref 11.5–15.5)
WBC: 9.1 10*3/uL (ref 4.0–10.5)
nRBC: 0 % (ref 0.0–0.2)

## 2023-02-26 LAB — COMPREHENSIVE METABOLIC PANEL
ALT: 25 U/L (ref 0–44)
AST: 36 U/L (ref 15–41)
Albumin: 4 g/dL (ref 3.5–5.0)
Alkaline Phosphatase: 65 U/L (ref 38–126)
Anion gap: 12 (ref 5–15)
BUN: 9 mg/dL (ref 8–23)
CO2: 24 mmol/L (ref 22–32)
Calcium: 9.9 mg/dL (ref 8.9–10.3)
Chloride: 105 mmol/L (ref 98–111)
Creatinine, Ser: 0.74 mg/dL (ref 0.44–1.00)
GFR, Estimated: >60 mL/min (ref >60)
Glucose, Bld: 83 mg/dL (ref 70–99)
Potassium: 3.5 mmol/L (ref 3.5–5.1)
Sodium: 141 mmol/L (ref 135–145)
Total Bilirubin: 1.4 mg/dL — ABNORMAL HIGH (ref 0.3–1.2)
Total Protein: 6.8 g/dL (ref 6.5–8.1)

## 2023-02-26 MED ORDER — IOHEXOL 350 MG/ML SOLN
100.0000 mL | Freq: Once | INTRAVENOUS | Status: AC | PRN
  Administered 2023-02-26: 75 mL via INTRAVENOUS

## 2023-02-26 NOTE — ED Triage Notes (Signed)
Pt sent by ophthalmologist for eval of possible CVA.  Vision changes in left eye for 2 weeks.  Pt was sent here with note for MRI possible MRA.

## 2023-02-26 NOTE — ED Provider Notes (Incomplete)
  [facility] K[facility] EMERGENCY DEPARTMENT AT [facility] Provider Note   CSN: [id number] Arrival date & time: 02/26/23  1805     History {Add pertinent medical, surgical, social history, OB history to HPI:1} Chief Complaint  Patient presents with   Eye Problem    Alexis Kramer is a 82 y.o. female.       Home Medications Prior to Admission medications   Medication Sig Start Date End Date Taking? Authorizing Provider  aspirin 81 MG chewable tablet Chew 1 tablet (81 mg total) by mouth 2 (two) times daily. 09/13/19   Elodia Florence, PA-C  cephALEXin (KEFLEX) 500 MG capsule Take 1 capsule (500 mg total) by mouth 2 (two) times daily. 09/16/22   Carlisle Beers, FNP  HYDROcodone-acetaminophen (NORCO/VICODIN) 5-325 MG tablet Take 1-2 tablets by mouth every 6 (six) hours as needed for moderate pain or severe pain (post op pain). 09/13/19   Elodia Florence, PA-C  ondansetron (ZOFRAN-ODT) 4 MG disintegrating tablet Take 1 tablet (4 mg total) by mouth every 8 (eight) hours as needed for nausea or vomiting. 09/16/22   Carlisle Beers, FNP  pravastatin (PRAVACHOL) 80 MG tablet Take 80 mg by mouth daily. 08/24/19   [provider]  sertraline (ZOLOFT) 50 MG tablet Take 50 mg by mouth 3 (three) times a week. 06/04/19   [provider]  traMADol (ULTRAM) 50 MG tablet Take 50 mg by mouth daily as needed for pain. 08/26/19   [provider]  traZODone (DESYREL) 100 MG tablet Take 100 mg by mouth at bedtime. 06/16/19   [provider]      Allergies    Patient has no known allergies.    Review of Systems   Review of Systems  Physical Exam Updated Vital Signs BP (!) 160/63   Pulse 63   Temp 98 F (36.7 C) (Oral)   Resp 20   SpO2 100%  Physical Exam  ED Results / Procedures / Treatments   Labs (all labs ordered are listed, but only abnormal results are displayed) Labs Reviewed - No data to display  EKG None  Radiology No results  found.  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 No hole, tears, or retinal detachment, need MRI ? O.D.    ***  {Document critical care time when appropriate:1} {Document review of labs and clinical decision tools ie heart score, Chads2Vasc2 etc:1}  {Document your independent review of radiology images, and any outside records:1} {Document your discussion with family members, caretakers, and with consultants:1} {Document social determinants of health affecting pt's care:1} {Document your decision making why or why not admission, treatments were needed:1} Final Clinical Impression(s) / ED Diagnoses Final diagnoses:  None    Rx / DC Orders ED Discharge Orders     None

## 2023-02-26 NOTE — ED Provider Notes (Signed)
[place] EMERGENCY DEPARTMENT AT [facility] Provider Note   CSN: [id number] Arrival date & time: 02/26/23  1805     History {Add pertinent medical, surgical, social history, OB history to HPI:1} Chief Complaint  Patient presents with   Eye Problem    Alexis Kramer is a 82 y.o. female, history of breast cancer, who presents to the ED secondary to left eye vision loss has been going on for the last 2 weeks.  She states that she woke up 1 day, went to go check her thermostat, and could not see half thermostat.  She states that she cannot see from the periphery of her left eye, can see her nose, but cannot see laterally out of her left eye.  The vision is blurry/black, and she went to the optometrist today and was told to come to the ER for an MRI for possible stroke.  Her symptoms have been persistent for the last 2 weeks, she has not any weakness on one side of her body, slurring of her speech, or difficulty swallowing.  Home Medications Prior to Admission medications   Medication Sig Start Date End Date Taking? Authorizing Provider  aspirin 81 MG chewable tablet Chew 1 tablet (81 mg total) by mouth 2 (two) times daily. 09/13/19   Elodia Florence, PA-C  cephALEXin (KEFLEX) 500 MG capsule Take 1 capsule (500 mg total) by mouth 2 (two) times daily. 09/16/22   Carlisle Beers, FNP  HYDROcodone-acetaminophen (NORCO/VICODIN) 5-325 MG tablet Take 1-2 tablets by mouth every 6 (six) hours as needed for moderate pain or severe pain (post op pain). 09/13/19   Elodia Florence, PA-C  ondansetron (ZOFRAN-ODT) 4 MG disintegrating tablet Take 1 tablet (4 mg total) by mouth every 8 (eight) hours as needed for nausea or vomiting. 09/16/22   Carlisle Beers, FNP  pravastatin (PRAVACHOL) 80 MG tablet Take 80 mg by mouth daily. 08/24/19   [provider]  sertraline (ZOLOFT) 50 MG tablet Take 50 mg by mouth 3 (three) times a week. 06/04/19   [provider]  traMADol (ULTRAM) 50  MG tablet Take 50 mg by mouth daily as needed for pain. 08/26/19   [provider]  traZODone (DESYREL) 100 MG tablet Take 100 mg by mouth at bedtime. 06/16/19   [provider]      Allergies    Patient has no known allergies.    Review of Systems   Review of Systems  Eyes:  Positive for visual disturbance.  Respiratory:  Negative for shortness of breath.   Cardiovascular:  Negative for chest pain.  Neurological:  Negative for numbness.    Physical Exam Updated Vital Signs BP (!) 160/63   Pulse 63   Temp 98 F (36.7 C) (Oral)   Resp 20   SpO2 100%  Physical Exam Vitals and nursing note reviewed.  Constitutional:      General: She is not in acute distress.    Appearance: She is well-developed.  HENT:     Head: Normocephalic and atraumatic.  Eyes:     Conjunctiva/sclera: Conjunctivae normal.  Cardiovascular:     Rate and Rhythm: Normal rate and regular rhythm.     Heart sounds: No murmur heard. Pulmonary:     Effort: Pulmonary effort is normal. No respiratory distress.     Breath sounds: Normal breath sounds.  Abdominal:     Palpations: Abdomen is soft.     Tenderness: There is no abdominal tenderness.  Musculoskeletal:  General: No swelling.     Cervical back: Neck supple.  Skin:    General: Skin is warm and dry.     Capillary Refill: Capillary refill takes less than 2 seconds.  Neurological:     Mental Status: She is alert.     Comments: 3/5 strength of left lower extremity, 5 out of 5 strength of bilateral upper and right lower extremity.  Reduced visual field of the left lateral aspect.  Normal finger-to-nose.  No sensory deficits.  Psychiatric:        Mood and Affect: Mood normal.     ED Results / Procedures / Treatments   Labs (all labs ordered are listed, but only abnormal results are displayed) Labs Reviewed  CBC WITH DIFFERENTIAL/PLATELET  COMPREHENSIVE METABOLIC PANEL    EKG None  Radiology No results  found.  Procedures Procedures  {Document cardiac monitor, telemetry assessment procedure when appropriate:1}  Medications Ordered in ED Medications - No data to display  ED Course/ Medical Decision Making/ A&P   {   Click here for ABCD2, HEART and other calculatorsREFRESH Note before signing :1}                              Medical Decision Making Amount and/or Complexity of Data Reviewed Labs: ordered. Radiology: ordered.   ***  {Document critical care time when appropriate:1} {Document review of labs and clinical decision tools ie heart score, Chads2Vasc2 etc:1}  {Document your independent review of radiology images, and any outside records:1} {Document your discussion with family members, caretakers, and with consultants:1} {Document social determinants of health affecting pt's care:1} {Document your decision making why or why not admission, treatments were needed:1} Final Clinical Impression(s) / ED Diagnoses Final diagnoses:  None    Rx / DC Orders ED Discharge Orders     None

## 2023-02-27 DIAGNOSIS — E237 Disorder of pituitary gland, unspecified: Secondary | ICD-10-CM | POA: Diagnosis not present

## 2023-02-27 LAB — CORTISOL: Cortisol, Plasma: 2.4 ug/dL

## 2023-02-27 LAB — T4, FREE: Free T4: 0.45 ng/dL — ABNORMAL LOW (ref 0.61–1.12)

## 2023-02-27 NOTE — ED Provider Notes (Signed)
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 Spoke with Costentino, Neurosurgery who has reviewed CT. Does not recommend admission but requests pituitary hormone panel be drawn before outpatient follow up. Discussed these findings and the plan with the patient and multiple family members at bedside who are in agreement. She was given strict return precautions, including recommendation to go to [facility] if symptoms worsen in the meantime.    Pollyann Savoy, MD 02/27/23 [phone]

## 2023-03-02 LAB — LUTEINIZING HORMONE: LH: 0.5 m[IU]/mL — ABNORMAL LOW (ref 7.7–58.5)

## 2023-03-02 LAB — INSULIN-LIKE GROWTH FACTOR: Somatomedin C: 63 ng/mL (ref 39–177)

## 2023-03-02 LAB — ACTH: C206 ACTH: 8.5 pg/mL (ref 7.2–63.3)

## 2023-03-02 LAB — GROWTH HORMONE: Growth Hormone: 0.1 ng/mL (ref 0.0–10.0)

## 2023-03-02 LAB — PROLACTIN: Prolactin: 51.8 ng/mL — ABNORMAL HIGH (ref 3.6–32.0)

## 2023-03-03 DIAGNOSIS — D352 Benign neoplasm of pituitary gland: Secondary | ICD-10-CM | POA: Diagnosis not present

## 2023-03-10 DIAGNOSIS — J3489 Other specified disorders of nose and nasal sinuses: Secondary | ICD-10-CM | POA: Diagnosis not present

## 2023-03-10 DIAGNOSIS — E236 Other disorders of pituitary gland: Secondary | ICD-10-CM | POA: Diagnosis not present

## 2023-03-10 DIAGNOSIS — R930 Abnormal findings on diagnostic imaging of skull and head, not elsewhere classified: Secondary | ICD-10-CM | POA: Diagnosis not present

## 2023-03-10 DIAGNOSIS — Z01818 Encounter for other preprocedural examination: Secondary | ICD-10-CM | POA: Diagnosis not present

## 2023-03-10 DIAGNOSIS — J309 Allergic rhinitis, unspecified: Secondary | ICD-10-CM | POA: Diagnosis not present

## 2023-03-10 DIAGNOSIS — J329 Chronic sinusitis, unspecified: Secondary | ICD-10-CM | POA: Diagnosis not present

## 2023-03-10 DIAGNOSIS — D497 Neoplasm of unspecified behavior of endocrine glands and other parts of nervous system: Secondary | ICD-10-CM | POA: Diagnosis not present

## 2023-03-11 DIAGNOSIS — D352 Benign neoplasm of pituitary gland: Secondary | ICD-10-CM | POA: Diagnosis not present

## 2023-03-17 ENCOUNTER — Other Ambulatory Visit: Payer: Self-pay | Admitting: Neurological Surgery

## 2023-03-23 ENCOUNTER — Other Ambulatory Visit: Payer: Self-pay | Admitting: Family Medicine

## 2023-03-23 ENCOUNTER — Ambulatory Visit
Admission: RE | Admit: 2023-03-23 | Discharge: 2023-03-23 | Disposition: A | Discharge status: Home / Self Care | Payer: Medicare Other | Source: Ambulatory Visit | Attending: Family Medicine | Admitting: Family Medicine

## 2023-03-23 DIAGNOSIS — R9389 Abnormal findings on diagnostic imaging of other specified body structures: Secondary | ICD-10-CM

## 2023-03-23 DIAGNOSIS — R918 Other nonspecific abnormal finding of lung field: Secondary | ICD-10-CM | POA: Diagnosis not present

## 2023-03-23 DIAGNOSIS — E785 Hyperlipidemia, unspecified: Secondary | ICD-10-CM | POA: Diagnosis not present

## 2023-03-23 DIAGNOSIS — Z9989 Dependence on other enabling machines and devices: Secondary | ICD-10-CM | POA: Diagnosis not present

## 2023-03-23 DIAGNOSIS — Z23 Encounter for immunization: Secondary | ICD-10-CM | POA: Diagnosis not present

## 2023-03-23 DIAGNOSIS — N182 Chronic kidney disease, stage 2 (mild): Secondary | ICD-10-CM | POA: Diagnosis not present

## 2023-03-23 DIAGNOSIS — E237 Disorder of pituitary gland, unspecified: Secondary | ICD-10-CM | POA: Diagnosis not present

## 2023-03-31 DIAGNOSIS — M1712 Unilateral primary osteoarthritis, left knee: Secondary | ICD-10-CM | POA: Diagnosis not present

## 2023-04-05 NOTE — Pre-Procedure Instructions (Signed)
.Surgical Instructions   Your procedure is scheduled on April 14, 2023. Report to [facility] Main Entrance "A" at 6:30 A.M., then check in with the Admitting office. Any questions or running late day of surgery: call [phone]  Questions prior to your surgery date: call [phone], Monday-Friday, 8am-4pm. If you experience any cold or flu symptoms such as cough, fever, chills, shortness of breath, etc. between now and your scheduled surgery, please notify us at the above number.     Remember:  Do not eat or drink after midnight the night before your surgery. No gum, mints, or hard candy.       Take these medicines the morning of surgery with A SIP OF WATER: cephALEXin (KEFLEX)  pravastatin (PRAVACHOL)  sertraline (ZOLOFT)    May take these medicines IF NEEDED: HYDROcodone-acetaminophen (NORCO/VICODIN)  ondansetron (ZOFRAN-ODT)  traMADol (ULTRAM)     One week prior to surgery, STOP taking any Aspirin (unless otherwise instructed by your surgeon) Aleve, Naproxen, Ibuprofen, Motrin, Advil, Goody's, BC's, all herbal medications, fish oil, and non-prescription vitamins.                     Do NOT Smoke (Tobacco/Vaping) for 24 hours prior to your procedure.  If you use a CPAP at night, you may bring your mask/headgear for your overnight stay.   You will be asked to remove any contacts, glasses, piercing's, hearing aid's, dentures/partials prior to surgery. Please bring cases for these items if needed.    Patients discharged the day of surgery will not be allowed to drive home, and someone needs to stay with them for 24 hours.  SURGICAL WAITING ROOM VISITATION Patients may have no more than 2 support people in the waiting area - these visitors may rotate.   Pre-op nurse will coordinate an appropriate time for 1 ADULT support person, who may not rotate, to accompany patient in pre-op.  Children under the age of 45 must have an adult with them who is not the patient and  must remain in the main waiting area with an adult.  If the patient needs to stay at the hospital during part of their recovery, the visitor guidelines for inpatient rooms apply.  Please refer to the [facility] website for the visitor guidelines for any additional information.   If you received a COVID test during your pre-op visit  it is requested that you wear a mask when out in public, stay away from anyone that may not be feeling well and notify your surgeon if you develop symptoms. If you have been in contact with anyone that has tested positive in the last 10 days please notify you surgeon.      Pre-operative CHG Bathing Instructions   You can play a key role in reducing the risk of infection after surgery. Your skin needs to be as free of germs as possible. You can reduce the number of germs on your skin by washing with CHG (chlorhexidine gluconate) soap before surgery. CHG is an antiseptic soap that kills germs and continues to kill germs even after washing.   DO NOT use if you have an allergy to chlorhexidine/CHG or antibacterial soaps. If your skin becomes reddened or irritated, stop using the CHG and notify one of our RNs at [phone].              TAKE A SHOWER THE NIGHT BEFORE SURGERY AND THE DAY OF SURGERY    Please keep in mind the following:  DO  NOT shave, including legs and underarms, 48 hours prior to surgery.   You may shave your face before/day of surgery.  Place clean sheets on your bed the night before surgery Use a clean washcloth (not used since being washed) for each shower. DO NOT sleep with pet's night before surgery.  CHG Shower Instructions:  Wash your face and private area with normal soap. If you choose to wash your hair, wash first with your normal shampoo.  After you use shampoo/soap, rinse your hair and body thoroughly to remove shampoo/soap residue.  Turn the water OFF and apply half the bottle of CHG soap to a CLEAN washcloth.  Apply CHG soap  ONLY FROM YOUR NECK DOWN TO YOUR TOES (washing for 3-5 minutes)  DO NOT use CHG soap on face, private areas, open wounds, or sores.  Pay special attention to the area where your surgery is being performed.  If you are having back surgery, having someone wash your back for you may be helpful. Wait 2 minutes after CHG soap is applied, then you may rinse off the CHG soap.  Pat dry with a clean towel  Put on clean pajamas    Additional instructions for the day of surgery: DO NOT APPLY any lotions, deodorants, cologne, or perfumes.   Do not wear jewelry or makeup Do not wear nail polish, gel polish, artificial nails, or any other type of covering on natural nails (fingers and toes) Do not bring valuables to the hospital. [facility] is not responsible for valuables/personal belongings. Put on clean/comfortable clothes.  Please brush your teeth.  Ask your nurse before applying any prescription medications to the skin.

## 2023-04-06 ENCOUNTER — Encounter (HOSPITAL_COMMUNITY)
Admission: RE | Admit: 2023-04-06 | Discharge: 2023-04-06 | Disposition: A | Discharge status: Home / Self Care | Payer: Medicare Other | Source: Ambulatory Visit | Attending: Neurological Surgery | Admitting: Neurological Surgery

## 2023-04-06 ENCOUNTER — Encounter (HOSPITAL_COMMUNITY): Payer: Self-pay

## 2023-04-06 ENCOUNTER — Other Ambulatory Visit: Payer: Self-pay

## 2023-04-06 VITALS — BP 148/83 | HR 64 | Temp 98.1°F | Resp 17 | Ht 60.0 in | Wt 130.6 lb

## 2023-04-06 DIAGNOSIS — Z853 Personal history of malignant neoplasm of breast: Secondary | ICD-10-CM | POA: Insufficient documentation

## 2023-04-06 DIAGNOSIS — F17200 Nicotine dependence, unspecified, uncomplicated: Secondary | ICD-10-CM | POA: Diagnosis not present

## 2023-04-06 DIAGNOSIS — D497 Neoplasm of unspecified behavior of endocrine glands and other parts of nervous system: Secondary | ICD-10-CM | POA: Insufficient documentation

## 2023-04-06 DIAGNOSIS — Z01812 Encounter for preprocedural laboratory examination: Secondary | ICD-10-CM | POA: Diagnosis not present

## 2023-04-06 DIAGNOSIS — Z9012 Acquired absence of left breast and nipple: Secondary | ICD-10-CM | POA: Insufficient documentation

## 2023-04-06 DIAGNOSIS — Z01818 Encounter for other preprocedural examination: Secondary | ICD-10-CM

## 2023-04-06 DIAGNOSIS — G988 Other disorders of nervous system: Secondary | ICD-10-CM

## 2023-04-06 HISTORY — DX: Anxiety disorder, unspecified: F41.9

## 2023-04-06 HISTORY — DX: Gastro-esophageal reflux disease without esophagitis: K21.9

## 2023-04-06 HISTORY — DX: Depression, unspecified: F32.A

## 2023-04-06 HISTORY — DX: Dyspnea, unspecified: R06.00

## 2023-04-06 HISTORY — DX: Unspecified osteoarthritis, unspecified site: M19.90

## 2023-04-06 LAB — BASIC METABOLIC PANEL
Anion gap: 10 (ref 5–15)
BUN: 13 mg/dL (ref 8–23)
CO2: 24 mmol/L (ref 22–32)
Calcium: 8.3 mg/dL — ABNORMAL LOW (ref 8.9–10.3)
Chloride: 104 mmol/L (ref 98–111)
Creatinine, Ser: 0.77 mg/dL (ref 0.44–1.00)
GFR, Estimated: >60 mL/min (ref >60)
Glucose, Bld: 92 mg/dL (ref 70–99)
Potassium: 4.8 mmol/L (ref 3.5–5.1)
Sodium: 138 mmol/L (ref 135–145)

## 2023-04-06 LAB — CBC
HCT: 42.9 % (ref 36.0–46.0)
Hemoglobin: 14.4 g/dL (ref 12.0–15.0)
MCH: 31.4 pg (ref 26.0–34.0)
MCHC: 33.6 g/dL (ref 30.0–36.0)
MCV: 93.7 fL (ref 80.0–100.0)
Platelets: 210 10*3/uL (ref 150–400)
RBC: 4.58 MIL/uL (ref 3.87–5.11)
RDW: 14.5 % (ref 11.5–15.5)
WBC: 12.3 10*3/uL — ABNORMAL HIGH (ref 4.0–10.5)
nRBC: 0 % (ref 0.0–0.2)

## 2023-04-06 NOTE — Progress Notes (Signed)
PCP - Laurann Montana, MD Cardiologist -denies   PPM/ICD - denies Device Orders - n/a Rep Notified - n/a  Chest x-ray - 03/23/2023 EKG - 02/26/2023 Stress Test - denies ECHO - denies Cardiac Cath - denies  Sleep Study - denies CPAP - n/a  Fasting Blood Sugar - no DM Checks Blood Sugar _____ times a day  Last dose of GLP1 agonist-  n/a GLP1 instructions: n/a  Blood Thinner Instructions:n/a Aspirin Instructions: n/a  ERAS Protcol -no; NPO at midnight  PRE-SURGERY Ensure or G2- no  COVID TEST- n/a   Anesthesia review: yes (abnormal EKG on 02/26/2023)  Patient denies shortness of breath, fever, cough and chest pain at PAT appointment   All instructions explained to the patient, with a verbal understanding of the material. Patient agrees to go over the instructions while at home for a better understanding. Patient also instructed to self quarantine after being tested for COVID-19. The opportunity to ask questions was provided.

## 2023-04-06 NOTE — Pre-Procedure Instructions (Signed)
.Surgical Instructions     Your procedure is scheduled on April 14, 2023. Report to [facility] Main Entrance "A" at 6:30 A.M., then check in with the Admitting office. Any questions or running late day of surgery: call [phone]   Questions prior to your surgery date: call [phone], Monday-Friday, 8am-4pm. If you experience any cold or flu symptoms such as cough, fever, chills, shortness of breath, etc. between now and your scheduled surgery, please notify us at the above number.            Remember:       Do not eat or drink after midnight the night before your surgery. No gum, mints, or hard candy.          Take these medicines the morning of surgery with A SIP OF WATER:       pravastatin (PRAVACHOL)  sertraline (ZOLOFT)      May take these medicines IF NEEDED: Tylenol (Acetaminophen) ondansetron (ZOFRAN-ODT)    One week prior to surgery, STOP taking any Aspirin (unless otherwise instructed by your surgeon) Aleve, Naproxen, Ibuprofen, Motrin, Advil, Goody's, BC's, all herbal medications, fish oil, and non-prescription vitamins.                     Do NOT Smoke (Tobacco/Vaping) for 24 hours prior to your procedure.   If you use a CPAP at night, you may bring your mask/headgear for your overnight stay.   You will be asked to remove any contacts, glasses, piercing's, hearing aid's, dentures/partials prior to surgery. Please bring cases for these items if needed.    Patients discharged the day of surgery will not be allowed to drive home, and someone needs to stay with them for 24 hours.   SURGICAL WAITING ROOM VISITATION Patients may have no more than 2 support people in the waiting area - these visitors may rotate.   Pre-op nurse will coordinate an appropriate time for 1 ADULT support person, who may not rotate, to accompany patient in pre-op.  Children under the age of 22 must have an adult with them who is not the patient and must remain in the main waiting area with  an adult.   If the patient needs to stay at the hospital during part of their recovery, the visitor guidelines for inpatient rooms apply.   Please refer to the [facility] website for the visitor guidelines for any additional information.     If you received a COVID test during your pre-op visit  it is requested that you wear a mask when out in public, stay away from anyone that may not be feeling well and notify your surgeon if you develop symptoms. If you have been in contact with anyone that has tested positive in the last 10 days please notify you surgeon.         Pre-operative CHG Bathing Instructions    You can play a key role in reducing the risk of infection after surgery. Your skin needs to be as free of germs as possible. You can reduce the number of germs on your skin by washing with CHG (chlorhexidine gluconate) soap before surgery. CHG is an antiseptic soap that kills germs and continues to kill germs even after washing.    DO NOT use if you have an allergy to chlorhexidine/CHG or antibacterial soaps. If your skin becomes reddened or irritated, stop using the CHG and notify one of our RNs at [phone].  TAKE A SHOWER THE NIGHT BEFORE SURGERY AND THE DAY OF SURGERY     Please keep in mind the following:  DO NOT shave, including legs and underarms, 48 hours prior to surgery.   You may shave your face before/day of surgery.  Place clean sheets on your bed the night before surgery Use a clean washcloth (not used since being washed) for each shower. DO NOT sleep with pet's night before surgery.   CHG Shower Instructions:  Wash your face and private area with normal soap. If you choose to wash your hair, wash first with your normal shampoo.  After you use shampoo/soap, rinse your hair and body thoroughly to remove shampoo/soap residue.  Turn the water OFF and apply half the bottle of CHG soap to a CLEAN washcloth.  Apply CHG soap ONLY FROM YOUR NECK DOWN TO YOUR  TOES (washing for 3-5 minutes)  DO NOT use CHG soap on face, private areas, open wounds, or sores.  Pay special attention to the area where your surgery is being performed.  If you are having back surgery, having someone wash your back for you may be helpful. Wait 2 minutes after CHG soap is applied, then you may rinse off the CHG soap.  Pat dry with a clean towel  Put on clean pajamas     Additional instructions for the day of surgery: DO NOT APPLY any lotions, deodorants, cologne, or perfumes.   Do not wear jewelry or makeup Do not wear nail polish, gel polish, artificial nails, or any other type of covering on natural nails (fingers and toes) Do not bring valuables to the hospital. [facility] is not responsible for valuables/personal belongings. Put on clean/comfortable clothes.  Please brush your teeth.  Ask your nurse before applying any prescription medications to the skin.

## 2023-04-07 ENCOUNTER — Other Ambulatory Visit: Payer: Self-pay | Admitting: Otolaryngology

## 2023-04-07 NOTE — Progress Notes (Signed)
Anesthesia Chart Review:  Case: [id number] Date/Time: 04/14/23 0814   Procedures:      Endoscopic endonasal resection of pituitary tumor     TRANSNASAL APPROACH     POSSIBLE SEPTOPLASTY     POSSIBLE NASOSEPTO FLAP   Anesthesia type: General   Pre-op diagnosis: Pituitary tumor   Location: [facility] OR ROOM 21 / [facility] OR   Surgeons: Jadene Pierini, MD; Skotnicki, Meghan A, DO       DISCUSSION: Patient is an 82 year old female scheduled for the above procedure. She presented to ED on 02/26/23 after ophthalmologist sent her to be evaluated for possible CVA given left vision changes (left temporal hemianopia) x 2 weeks. 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 Neurosurgery was contacted who recommended pituitary hormone panel and close out-patient follow-up. 02/27/23 labs showed low LH 0.5 and FT4 0.45, high Prolactin 51.8, normal somatomedin C 63, growth factor 0.1, ACTH 8.5, Cortisol 2.4.  She has since seen neurosurgery, ENT, primary care in follow-up and the above procedure planned.   Other history includes number smoker, left breast cancer (s/p left mastectomy with TRAM ~1997), pituitary tumor, dyspnea, GERD, anxiety, osteoarthritis (right TKR 09/12/19), cholecystectomy (08/20/06), hysterectomy.  She had preoperative medical evaluation on 03/23/23 by Laurann Montana, MD (See Deboraha Sprang at [facility]). She wrote, "Preoperative clearance  Clinical Notes: EKG and labs reviewed via EPIC today. Patient is stable for procedure so long as repeat CXR does not show further concerns." 03/23/23 follow-up CXR showed no active cardiopulmonary disease.  Preoperative labs reviewed. T&S was not done at PAT, so is scheduled for arrival. Anesthesia team to evaluate on the day of surgery.    VS: BP (!) 148/83   Pulse 64   Temp 36.7 C   Resp 17   Ht 5' (1.524 m)   Wt 59.2 kg    SpO2 100%   BMI 25.51 kg/m    PROVIDERS: Laurann Montana, MD is PCP    LABS: Labs reviewed: Acceptable for surgery. (all labs ordered are listed, but only abnormal results are displayed)  Labs Reviewed  BASIC METABOLIC PANEL - Abnormal; Notable for the following components:      Result Value   Calcium 8.3 (*)    All other components within normal limits  CBC - Abnormal; Notable for the following components:   WBC 12.3 (*)    All other components within normal limits     IMAGES: CXR 03/23/23: FINDINGS: The heart size and mediastinal contours are within normal limits. Both lungs are clear. The visualized skeletal structures are unremarkable. IMPRESSION: No active cardiopulmonary disease.   MRI Brain 03/11/23 ([facility]/P[facility]): IMPRESSION: 1. Intravenous access could not be achieved and a non-contrast examination was performed. Within this limitation, findings are as follows. 2. 2.0 x 2.0 x 2.6 cm sellar/suprasellar mass most likely reflecting a pituitary adenoma. Mass effect upon the optic chiasm and prechiasmatic optic nerves, as well as anteroinferior frontal lobes. The pituitary stalk is not well delineated. Within limitations of a non-contrast examination, there is no appreciable cavernous sinus invasion. Of note, the mass appears to insinuate between the sphenoid sinuses (see series 10, image 17). 3. Mild-to-moderate chronic small vessel ischemic changes within the cerebral white matter. 4. Left mastoid effusion.   Nasal/Sinus Endoscopy 102/24 ([facility]): Findings:  Patent nasal cavities bilaterally, with scant clear rhinorrhea.  No evidence of mucosal abnormality, nasal  polyps or focal obstruction.   CT Maxillofacial 03/10/23 ([facility]): 1. Enlargement of the sella because of a pituitary mass with  suprasellar extension. Tumor extends into the septum of the upper  sphenoid sinus region. The bone is thin in that area but I am not  certain that there is complete  breakthrough into the sphenoid sinus.  Right and left divisions of the sphenoid sinus are quite symmetric,  each with approximately equal access to the sella.  2. Minimal mucosal thickening at the right maxillary sinus floor,  not likely significant.  3. Single opacified anterior ethmoid air cell anteriorly on the left  and centrally on the right.   CTA Head & CTA Head/Neck 02/26/23: IMPRESSION: 1. No emergent large vessel occlusion or hemodynamically significant stenosis of the head or neck. 2. 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 This exerts mass effect on the left greater than right optic nerve. Pituitary protocol MRI of the brain with and without contrast is recommended. - Aortic Atherosclerosis (ICD10-I70.0).    EKG: Sinus rhythm Abnormal R-wave progression, early transition Borderline T abnormalities, anterior leads Confirmed by Vonita Moss [phone]) on 02/27/2023 1:43:28 PM - Anterior leads appear similar to tracing on 09/12/19. Inferior ST abnormality appears non-specific but probably more prominent when compared to 09/12/19 tracing.    CV: N/A   Past Medical History:  Diagnosis Date   Anxiety    Arthritis    both knees   Breast cancer (HCC)    Cancer (HCC) 1994   breast cancer ; left side   Depression    situational   Dyspnea    GERD (gastroesophageal reflux disease)     Past Surgical History:  Procedure Laterality Date   ABDOMINAL HYSTERECTOMY     APPENDECTOMY     EYE SURGERY Bilateral 2021   MASTECTOMY Left 1997   TONSILLECTOMY     TOTAL KNEE ARTHROPLASTY Right 09/12/2019   Procedure: RIGHT TOTAL KNEE ARTHROPLASTY;  Surgeon: Marcene Corning, MD;  Location: [facility] ORS;  Service: Orthopedics;  Laterality: Right;    MEDICATIONS:  carboxymethylcellulose (REFRESH PLUS) 0.5 % SOLN   Cholecalciferol (VITAMIN D3) 50 MCG (2000 UT) TABS   pravastatin (PRAVACHOL) 80 MG tablet   traMADol (ULTRAM) 50 MG  tablet   traZODone (DESYREL) 100 MG tablet   No current facility-administered medications for this encounter.    Shonna Chock, PA-C Surgical Short Stay/Anesthesiology [facility] Phone [phone] [facility] Phone [phone] 04/07/2023 1:40 PM

## 2023-04-07 NOTE — Anesthesia Preprocedure Evaluation (Addendum)
Anesthesia Evaluation  Patient identified by MRN, date of birth, ID band Patient awake    Reviewed: Allergy & Precautions, NPO status , Patient's Chart, lab work & pertinent test results  Airway Mallampati: II  TM Distance: >3 FB Neck ROM: Full    Dental  (+) Dental Advisory Given   Pulmonary neg pulmonary ROS   breath sounds clear to auscultation       Cardiovascular negative cardio ROS  Rhythm:Regular Rate:Normal     Neuro/Psych  Neuromuscular disease    GI/Hepatic Neg liver ROS,GERD  ,,  Endo/Other  negative endocrine ROS    Renal/GU negative Renal ROS     Musculoskeletal  (+) Arthritis ,    Abdominal   Peds  Hematology negative hematology ROS (+)   Anesthesia Other Findings   Reproductive/Obstetrics                             Anesthesia Physical Anesthesia Plan  ASA: 3  Anesthesia Plan: General   Post-op Pain Management: Tylenol PO (pre-op)*   Induction: Intravenous  PONV Risk Score and Plan: 3 and Dexamethasone, Ondansetron and Treatment may vary due to age or medical condition  Airway Management Planned: Oral ETT  Additional Equipment: Arterial line  Intra-op Plan:   Post-operative Plan: Extubation in OR  Informed Consent: I have reviewed the patients History and Physical, chart, labs and discussed the procedure including the risks, benefits and alternatives for the proposed anesthesia with the patient or authorized representative who has indicated his/her understanding and acceptance.     Dental advisory given  Plan Discussed with:   Anesthesia Plan Comments: (2IV's, A-line. GETA. Remi gtt  )       Anesthesia Quick Evaluation

## 2023-04-14 ENCOUNTER — Inpatient Hospital Stay (HOSPITAL_COMMUNITY): Payer: Medicare Other | Admitting: Physician Assistant

## 2023-04-14 ENCOUNTER — Inpatient Hospital Stay (HOSPITAL_COMMUNITY)
Admission: RE | Admit: 2023-04-14 | Discharge: 2023-04-16 | DRG: 614 | Disposition: A | Discharge status: Home / Self Care | Payer: Medicare Other | Attending: Neurosurgery | Admitting: Neurosurgery

## 2023-04-14 ENCOUNTER — Inpatient Hospital Stay (HOSPITAL_COMMUNITY): Payer: Medicare Other

## 2023-04-14 ENCOUNTER — Other Ambulatory Visit: Payer: Self-pay

## 2023-04-14 ENCOUNTER — Encounter (HOSPITAL_COMMUNITY)
Admission: RE | Disposition: A | Discharge status: Home / Self Care | Payer: Self-pay | Source: Home / Self Care | Attending: Neurological Surgery

## 2023-04-14 DIAGNOSIS — H5347 Heteronymous bilateral field defects: Secondary | ICD-10-CM | POA: Diagnosis not present

## 2023-04-14 DIAGNOSIS — G4700 Insomnia, unspecified: Secondary | ICD-10-CM | POA: Diagnosis not present

## 2023-04-14 DIAGNOSIS — E2740 Unspecified adrenocortical insufficiency: Secondary | ICD-10-CM | POA: Diagnosis not present

## 2023-04-14 DIAGNOSIS — Z90710 Acquired absence of both cervix and uterus: Secondary | ICD-10-CM | POA: Diagnosis not present

## 2023-04-14 DIAGNOSIS — Z803 Family history of malignant neoplasm of breast: Secondary | ICD-10-CM | POA: Diagnosis not present

## 2023-04-14 DIAGNOSIS — Z853 Personal history of malignant neoplasm of breast: Secondary | ICD-10-CM

## 2023-04-14 DIAGNOSIS — Z96651 Presence of right artificial knee joint: Secondary | ICD-10-CM | POA: Diagnosis not present

## 2023-04-14 DIAGNOSIS — I959 Hypotension, unspecified: Secondary | ICD-10-CM | POA: Diagnosis not present

## 2023-04-14 DIAGNOSIS — Z79899 Other long term (current) drug therapy: Secondary | ICD-10-CM

## 2023-04-14 DIAGNOSIS — E893 Postprocedural hypopituitarism: Principal | ICD-10-CM

## 2023-04-14 DIAGNOSIS — E039 Hypothyroidism, unspecified: Secondary | ICD-10-CM | POA: Diagnosis not present

## 2023-04-14 DIAGNOSIS — Z9012 Acquired absence of left breast and nipple: Secondary | ICD-10-CM | POA: Diagnosis not present

## 2023-04-14 DIAGNOSIS — D352 Benign neoplasm of pituitary gland: Secondary | ICD-10-CM | POA: Diagnosis not present

## 2023-04-14 DIAGNOSIS — R519 Headache, unspecified: Secondary | ICD-10-CM | POA: Diagnosis not present

## 2023-04-14 DIAGNOSIS — E785 Hyperlipidemia, unspecified: Secondary | ICD-10-CM | POA: Diagnosis present

## 2023-04-14 DIAGNOSIS — J3489 Other specified disorders of nose and nasal sinuses: Secondary | ICD-10-CM | POA: Diagnosis not present

## 2023-04-14 DIAGNOSIS — D497 Neoplasm of unspecified behavior of endocrine glands and other parts of nervous system: Secondary | ICD-10-CM | POA: Diagnosis not present

## 2023-04-14 HISTORY — PX: CRANIOTOMY: SHX93

## 2023-04-14 HISTORY — PX: TRANSNASAL APPROACH: SHX6149

## 2023-04-14 LAB — TYPE AND SCREEN
ABO/RH(D): O POS
Antibody Screen: NEGATIVE

## 2023-04-14 LAB — CBC
HCT: 35.9 % — ABNORMAL LOW (ref 36.0–46.0)
Hemoglobin: 12.1 g/dL (ref 12.0–15.0)
MCH: 31.3 pg (ref 26.0–34.0)
MCHC: 33.7 g/dL (ref 30.0–36.0)
MCV: 92.8 fL (ref 80.0–100.0)
Platelets: 208 10*3/uL (ref 150–400)
RBC: 3.87 MIL/uL (ref 3.87–5.11)
RDW: 15.1 % (ref 11.5–15.5)
WBC: 16.9 10*3/uL — ABNORMAL HIGH (ref 4.0–10.5)
nRBC: 0 % (ref 0.0–0.2)

## 2023-04-14 LAB — CREATININE, SERUM
Creatinine, Ser: 0.73 mg/dL (ref 0.44–1.00)
GFR, Estimated: >60 mL/min (ref >60)

## 2023-04-14 SURGERY — CRANIOTOMY HYPOPHYSECTOMY TRANSNASAL APPROACH
Anesthesia: General

## 2023-04-14 MED ORDER — PHENYLEPHRINE HCL-NACL 20-0.9 MG/250ML-% IV SOLN
INTRAVENOUS | Status: DC | PRN
  Administered 2023-04-14: 20 ug/min via INTRAVENOUS
  Administered 2023-04-14: 30 ug/min via INTRAVENOUS
  Administered 2023-04-14: 100 ug via INTRAVENOUS

## 2023-04-14 MED ORDER — EPINEPHRINE HCL (NASAL) 0.1 % NA SOLN
NASAL | Status: DC | PRN
  Administered 2023-04-14 (×2): 10 mL via NASAL

## 2023-04-14 MED ORDER — CEFAZOLIN SODIUM-DEXTROSE 2-4 GM/100ML-% IV SOLN
2.0000 g | INTRAVENOUS | Status: AC
  Administered 2023-04-14: 2 g via INTRAVENOUS
  Filled 2023-04-14: qty 100

## 2023-04-14 MED ORDER — FENTANYL CITRATE (PF) 250 MCG/5ML IJ SOLN
INTRAMUSCULAR | Status: AC
  Filled 2023-04-14: qty 5

## 2023-04-14 MED ORDER — SODIUM CHLORIDE 0.9 % IV SOLN: INTRAVENOUS | Status: DC | PRN

## 2023-04-14 MED ORDER — ONDANSETRON HCL 4 MG/2ML IJ SOLN
INTRAMUSCULAR | Status: AC
  Filled 2023-04-14: qty 2

## 2023-04-14 MED ORDER — CLEVIDIPINE BUTYRATE 0.5 MG/ML IV EMUL
INTRAVENOUS | Status: AC
  Filled 2023-04-14: qty 50

## 2023-04-14 MED ORDER — THROMBIN 5000 UNITS EX SOLR
CUTANEOUS | Status: AC
  Filled 2023-04-14: qty 5000

## 2023-04-14 MED ORDER — PROPOFOL 10 MG/ML IV BOLUS
INTRAVENOUS | Status: AC
  Filled 2023-04-14: qty 20

## 2023-04-14 MED ORDER — ALBUMIN HUMAN 5 % IV SOLN: INTRAVENOUS | Status: DC | PRN

## 2023-04-14 MED ORDER — DOCUSATE SODIUM 100 MG PO CAPS
100.0000 mg | ORAL_CAPSULE | Freq: Two times a day (BID) | ORAL | Status: DC
  Administered 2023-04-14 – 2023-04-16 (×5): 100 mg via ORAL
  Filled 2023-04-14 (×5): qty 1

## 2023-04-14 MED ORDER — 0.9 % SODIUM CHLORIDE (POUR BTL) OPTIME
TOPICAL | Status: DC | PRN
  Administered 2023-04-14: 1000 mL

## 2023-04-14 MED ORDER — HEMOSTATIC AGENTS (NO CHARGE) OPTIME
TOPICAL | Status: DC | PRN
  Administered 2023-04-14: 1 via TOPICAL

## 2023-04-14 MED ORDER — EPHEDRINE SULFATE-NACL 50-0.9 MG/10ML-% IV SOSY
PREFILLED_SYRINGE | INTRAVENOUS | Status: DC | PRN
  Administered 2023-04-14: 2.5 mg via INTRAVENOUS
  Administered 2023-04-14: 5 mg via INTRAVENOUS

## 2023-04-14 MED ORDER — INDOCYANINE GREEN 25 MG IV SOLR: INTRAVENOUS | Status: DC | PRN

## 2023-04-14 MED ORDER — CEFAZOLIN SODIUM-DEXTROSE 2-4 GM/100ML-% IV SOLN
2.0000 g | Freq: Three times a day (TID) | INTRAVENOUS | Status: AC
  Administered 2023-04-14 – 2023-04-15 (×2): 2 g via INTRAVENOUS
  Filled 2023-04-14 (×2): qty 100

## 2023-04-14 MED ORDER — CHLORHEXIDINE GLUCONATE 0.12 % MT SOLN
15.0000 mL | Freq: Once | OROMUCOSAL | Status: AC
  Administered 2023-04-14: 15 mL via OROMUCOSAL
  Filled 2023-04-14: qty 15

## 2023-04-14 MED ORDER — ROCURONIUM BROMIDE 10 MG/ML (PF) SYRINGE
PREFILLED_SYRINGE | INTRAVENOUS | Status: AC
  Filled 2023-04-14: qty 10

## 2023-04-14 MED ORDER — PHENYLEPHRINE 80 MCG/ML (10ML) SYRINGE FOR IV PUSH (FOR BLOOD PRESSURE SUPPORT)
PREFILLED_SYRINGE | INTRAVENOUS | Status: AC
  Filled 2023-04-14: qty 10

## 2023-04-14 MED ORDER — DEXAMETHASONE SODIUM PHOSPHATE 10 MG/ML IJ SOLN
INTRAMUSCULAR | Status: DC | PRN
  Administered 2023-04-14: 10 mg via INTRAVENOUS

## 2023-04-14 MED ORDER — POLYETHYLENE GLYCOL 3350 17 G PO PACK: 17.0000 g | PACK | Freq: Every day | ORAL | Status: DC | PRN

## 2023-04-14 MED ORDER — LIDOCAINE 2% (20 MG/ML) 5 ML SYRINGE
INTRAMUSCULAR | Status: DC | PRN
  Administered 2023-04-14: 60 mg via INTRAVENOUS

## 2023-04-14 MED ORDER — AMISULPRIDE (ANTIEMETIC) 5 MG/2ML IV SOLN
10.0000 mg | Freq: Once | INTRAVENOUS | Status: AC | PRN
  Administered 2023-04-14: 10 mg via INTRAVENOUS

## 2023-04-14 MED ORDER — ORAL CARE MOUTH RINSE: 15.0000 mL | Freq: Once | OROMUCOSAL | Status: AC

## 2023-04-14 MED ORDER — LIDOCAINE 2% (20 MG/ML) 5 ML SYRINGE
INTRAMUSCULAR | Status: AC
  Filled 2023-04-14: qty 5

## 2023-04-14 MED ORDER — AMISULPRIDE (ANTIEMETIC) 5 MG/2ML IV SOLN
INTRAVENOUS | Status: AC
  Filled 2023-04-14: qty 4

## 2023-04-14 MED ORDER — FENTANYL CITRATE (PF) 250 MCG/5ML IJ SOLN
INTRAMUSCULAR | Status: DC | PRN
  Administered 2023-04-14 (×4): 50 ug via INTRAVENOUS

## 2023-04-14 MED ORDER — SODIUM CHLORIDE 0.9 % IV SOLN
0.1500 ug/kg/min | INTRAVENOUS | Status: AC
  Administered 2023-04-14: .1 ug/kg/min via INTRAVENOUS
  Filled 2023-04-14: qty 2000

## 2023-04-14 MED ORDER — THROMBIN 5000 UNITS EX SOLR
OROMUCOSAL | Status: DC | PRN
  Administered 2023-04-14: 5 mL via TOPICAL

## 2023-04-14 MED ORDER — EPINEPHRINE HCL (NASAL) 0.1 % NA SOLN
NASAL | Status: AC
  Filled 2023-04-14: qty 90

## 2023-04-14 MED ORDER — CHLORHEXIDINE GLUCONATE CLOTH 2 % EX PADS
6.0000 | MEDICATED_PAD | Freq: Every day | CUTANEOUS | Status: DC
  Administered 2023-04-14 – 2023-04-15 (×2): 6 via TOPICAL

## 2023-04-14 MED ORDER — GLYCOPYRROLATE PF 0.2 MG/ML IJ SOSY
PREFILLED_SYRINGE | INTRAMUSCULAR | Status: AC
  Filled 2023-04-14: qty 1

## 2023-04-14 MED ORDER — EPHEDRINE 5 MG/ML INJ
INTRAVENOUS | Status: AC
  Filled 2023-04-14: qty 5

## 2023-04-14 MED ORDER — PRAVASTATIN SODIUM 40 MG PO TABS
80.0000 mg | ORAL_TABLET | Freq: Every day | ORAL | Status: DC
  Administered 2023-04-14 – 2023-04-16 (×3): 80 mg via ORAL
  Filled 2023-04-14 (×3): qty 2

## 2023-04-14 MED ORDER — SALINE SPRAY 0.65 % NA SOLN
3.0000 | NASAL | Status: DC
  Administered 2023-04-14 – 2023-04-16 (×10): 3 via NASAL
  Filled 2023-04-14: qty 44

## 2023-04-14 MED ORDER — FENTANYL CITRATE (PF) 100 MCG/2ML IJ SOLN
25.0000 ug | INTRAMUSCULAR | Status: DC | PRN
  Administered 2023-04-14: 25 ug via INTRAVENOUS

## 2023-04-14 MED ORDER — LIDOCAINE-EPINEPHRINE 1 %-1:100000 IJ SOLN
INTRAMUSCULAR | Status: AC
  Filled 2023-04-14: qty 1

## 2023-04-14 MED ORDER — TRAZODONE HCL 50 MG PO TABS
100.0000 mg | ORAL_TABLET | Freq: Every day | ORAL | Status: DC
  Administered 2023-04-14 – 2023-04-15 (×2): 100 mg via ORAL
  Filled 2023-04-14 (×2): qty 2

## 2023-04-14 MED ORDER — POLYVINYL ALCOHOL 1.4 % OP SOLN: 1.0000 [drp] | Freq: Three times a day (TID) | OPHTHALMIC | Status: DC | PRN

## 2023-04-14 MED ORDER — LIDOCAINE-EPINEPHRINE 1 %-1:100000 IJ SOLN
INTRAMUSCULAR | Status: DC | PRN
  Administered 2023-04-14: 6 mL

## 2023-04-14 MED ORDER — DEXAMETHASONE SODIUM PHOSPHATE 10 MG/ML IJ SOLN
INTRAMUSCULAR | Status: AC
  Filled 2023-04-14: qty 1

## 2023-04-14 MED ORDER — PROPOFOL 10 MG/ML IV BOLUS
INTRAVENOUS | Status: DC | PRN
  Administered 2023-04-14 (×2): 200 mg via INTRAVENOUS

## 2023-04-14 MED ORDER — PROMETHAZINE HCL 25 MG PO TABS: 12.5000 mg | ORAL_TABLET | ORAL | Status: DC | PRN

## 2023-04-14 MED ORDER — SODIUM CHLORIDE 0.9 % IR SOLN
Status: DC | PRN
  Administered 2023-04-14: 500 mL

## 2023-04-14 MED ORDER — BACITRACIN ZINC 500 UNIT/GM EX OINT
TOPICAL_OINTMENT | CUTANEOUS | Status: DC | PRN
  Administered 2023-04-14: 1 via TOPICAL

## 2023-04-14 MED ORDER — PHENYLEPHRINE 80 MCG/ML (10ML) SYRINGE FOR IV PUSH (FOR BLOOD PRESSURE SUPPORT)
PREFILLED_SYRINGE | INTRAVENOUS | Status: DC | PRN
  Administered 2023-04-14: 40 ug via INTRAVENOUS
  Administered 2023-04-14 (×2): 80 ug via INTRAVENOUS

## 2023-04-14 MED ORDER — ACETAMINOPHEN 325 MG PO TABS
650.0000 mg | ORAL_TABLET | ORAL | Status: DC | PRN
  Administered 2023-04-16 (×2): 650 mg via ORAL
  Filled 2023-04-14 (×2): qty 2

## 2023-04-14 MED ORDER — ROCURONIUM BROMIDE 10 MG/ML (PF) SYRINGE
PREFILLED_SYRINGE | INTRAVENOUS | Status: DC | PRN
  Administered 2023-04-14: 50 mg via INTRAVENOUS

## 2023-04-14 MED ORDER — ORAL CARE MOUTH RINSE: 15.0000 mL | OROMUCOSAL | Status: DC | PRN

## 2023-04-14 MED ORDER — ONDANSETRON HCL 4 MG/2ML IJ SOLN: 4.0000 mg | INTRAMUSCULAR | Status: DC | PRN

## 2023-04-14 MED ORDER — ONDANSETRON HCL 4 MG/2ML IJ SOLN
INTRAMUSCULAR | Status: DC | PRN
  Administered 2023-04-14: 4 mg via INTRAVENOUS

## 2023-04-14 MED ORDER — HYDROMORPHONE HCL 1 MG/ML IJ SOLN
INTRAMUSCULAR | Status: AC
  Filled 2023-04-14: qty 0.5

## 2023-04-14 MED ORDER — HYDROCODONE-ACETAMINOPHEN 5-325 MG PO TABS
1.0000 | ORAL_TABLET | ORAL | Status: DC | PRN
  Administered 2023-04-14 – 2023-04-15 (×4): 1 via ORAL
  Filled 2023-04-14 (×4): qty 1

## 2023-04-14 MED ORDER — LABETALOL HCL 5 MG/ML IV SOLN: 10.0000 mg | INTRAVENOUS | Status: DC | PRN

## 2023-04-14 MED ORDER — HYDROMORPHONE HCL 1 MG/ML IJ SOLN: 0.5000 mg | INTRAMUSCULAR | Status: DC | PRN

## 2023-04-14 MED ORDER — ACETAMINOPHEN 500 MG PO TABS
1000.0000 mg | ORAL_TABLET | Freq: Once | ORAL | Status: AC
  Administered 2023-04-14: 1000 mg via ORAL
  Filled 2023-04-14: qty 2

## 2023-04-14 MED ORDER — GLYCOPYRROLATE 0.2 MG/ML IJ SOLN
INTRAMUSCULAR | Status: DC | PRN
  Administered 2023-04-14: .2 mg via INTRAVENOUS

## 2023-04-14 MED ORDER — CEFAZOLIN SODIUM-DEXTROSE 2-4 GM/100ML-% IV SOLN: 2.0000 g | INTRAVENOUS | Status: DC

## 2023-04-14 MED ORDER — BACITRACIN ZINC 500 UNIT/GM EX OINT
TOPICAL_OINTMENT | CUTANEOUS | Status: AC
  Filled 2023-04-14: qty 28.35

## 2023-04-14 MED ORDER — ESMOLOL HCL 100 MG/10ML IV SOLN
INTRAVENOUS | Status: DC | PRN
  Administered 2023-04-14: 30 mg via INTRAVENOUS

## 2023-04-14 MED ORDER — CLEVIDIPINE BUTYRATE 0.5 MG/ML IV EMUL
INTRAVENOUS | Status: DC | PRN
  Administered 2023-04-14: 2 mg/h via INTRAVENOUS

## 2023-04-14 MED ORDER — ACETAMINOPHEN 650 MG RE SUPP: 650.0000 mg | RECTAL | Status: DC | PRN

## 2023-04-14 MED ORDER — SUGAMMADEX SODIUM 200 MG/2ML IV SOLN
INTRAVENOUS | Status: DC | PRN
  Administered 2023-04-14: 200 mg via INTRAVENOUS

## 2023-04-14 MED ORDER — ONDANSETRON HCL 4 MG PO TABS
4.0000 mg | ORAL_TABLET | ORAL | Status: DC | PRN
  Administered 2023-04-14 – 2023-04-15 (×2): 4 mg via ORAL
  Filled 2023-04-14 (×2): qty 1

## 2023-04-14 MED ORDER — HEPARIN SODIUM (PORCINE) 5000 UNIT/ML IJ SOLN
5000.0000 [IU] | Freq: Three times a day (TID) | INTRAMUSCULAR | Status: DC
  Administered 2023-04-16 (×2): 5000 [IU] via SUBCUTANEOUS
  Filled 2023-04-14 (×2): qty 1

## 2023-04-14 MED ORDER — CHLORHEXIDINE GLUCONATE CLOTH 2 % EX PADS
6.0000 | MEDICATED_PAD | Freq: Once | CUTANEOUS | Status: DC
  Administered 2023-04-14: 6 via TOPICAL

## 2023-04-14 MED ORDER — OXYMETAZOLINE HCL 0.05 % NA SOLN: 1.0000 | Freq: Two times a day (BID) | NASAL | Status: DC | PRN

## 2023-04-14 MED ORDER — LACTATED RINGERS IV SOLN: INTRAVENOUS | Status: DC

## 2023-04-14 MED ORDER — FENTANYL CITRATE (PF) 100 MCG/2ML IJ SOLN
INTRAMUSCULAR | Status: AC
  Filled 2023-04-14: qty 2

## 2023-04-14 SURGICAL SUPPLY — 127 items
APL SKNCLS STERI-STRIP NONHPOA (GAUZE/BANDAGES/DRESSINGS) ×1
ATTRACTOMAT 16X20 MAGNETIC DRP (DRAPES) ×1 IMPLANT
BAG COUNTER SPONGE SURGICOUNT (BAG) ×2 IMPLANT
BAG SPNG CNTER NS LX DISP (BAG) ×2
BENZOIN TINCTURE PRP APPL 2/3 (GAUZE/BANDAGES/DRESSINGS) ×1 IMPLANT
BETADINE 5% OPHTHALMIC (OPHTHALMIC) ×2 IMPLANT
BLADE NAVIG QUADCUT 4.3X13 M4 (BLADE) ×1 IMPLANT
BLADE RAD40 ROTATE 4M 4 5PK (BLADE) IMPLANT
BLADE SURG 10 STRL SS (BLADE) ×1 IMPLANT
BLADE SURG 11 STRL SS (BLADE) ×2 IMPLANT
BLADE SURG 15 STRL LF DISP TIS (BLADE) ×3 IMPLANT
BLADE SURG 15 STRL SS (BLADE) ×3
BUR DIAMOND 13X5 70D (BURR) IMPLANT
BUR DIAMOND CURV 15X5 15D (BURR) IMPLANT
BUR TAPER CHOANAL ATRESIA 30K (BURR) ×1 IMPLANT
CABLE BIPOLOR RESECTION CORD (MISCELLANEOUS) ×1 IMPLANT
CANISTER SUCT 3000ML PPV (MISCELLANEOUS) ×3 IMPLANT
CATH LUMBAR HERMETIC 14G (CATHETERS) ×1 IMPLANT
CATHETER LUMBAR HERMETIC 14G (CATHETERS) ×1
CNTNR URN SCR LID CUP LEK RST (MISCELLANEOUS) ×1 IMPLANT
COAGULATOR SUCT SWTCH 10FR 6 (ELECTROSURGICAL) IMPLANT
CONT SPEC 4OZ STRL OR WHT (MISCELLANEOUS) ×1
COVER BACK TABLE 60X90IN (DRAPES) IMPLANT
COVER MAYO STAND STRL (DRAPES) ×1 IMPLANT
COVER SURGICAL LIGHT HANDLE (MISCELLANEOUS) ×1 IMPLANT
DEFOGGER MIRROR 1QT (MISCELLANEOUS) ×1 IMPLANT
DRAPE HALF SHEET 40X57 (DRAPES) ×2 IMPLANT
DRAPE INCISE IOBAN 66X45 STRL (DRAPES) ×1 IMPLANT
DRAPE MICROSCOPE SLANT 54X150 (MISCELLANEOUS) IMPLANT
DRAPE SURG 17X23 STRL (DRAPES) ×3 IMPLANT
DRESSING NASAL POPE 10X1.5X2.5 (GAUZE/BANDAGES/DRESSINGS) IMPLANT
DRSG NASAL POPE 10X1.5X2.5 (GAUZE/BANDAGES/DRESSINGS)
DRSG TEGADERM 2-3/8X2-3/4 SM (GAUZE/BANDAGES/DRESSINGS) IMPLANT
DRSG TELFA 3X8 NADH STRL (GAUZE/BANDAGES/DRESSINGS) IMPLANT
DURAPREP 26ML APPLICATOR (WOUND CARE) ×1 IMPLANT
ELECT COATED BLADE 2.86 ST (ELECTRODE) ×1 IMPLANT
ELECT NDL TIP 2.8 STRL (NEEDLE) ×1 IMPLANT
ELECT NEEDLE TIP 2.8 STRL (NEEDLE)
ELECT REM PT RETURN 9FT ADLT (ELECTROSURGICAL) ×1
ELECTRODE NDL INSULATED 6.5 (ELECTROSURGICAL) IMPLANT
ELECTRODE REM PT RTRN 9FT ADLT (ELECTROSURGICAL) ×2 IMPLANT
GAUZE 4X4 16PLY ~~LOC~~+RFID DBL (SPONGE) IMPLANT
GAUZE PACKING FOLDED 2 STR (GAUZE/BANDAGES/DRESSINGS) ×1 IMPLANT
GAUZE SPONGE 2X2 8PLY STRL LF (GAUZE/BANDAGES/DRESSINGS) ×1 IMPLANT
GAUZE SPONGE 4X4 12PLY STRL (GAUZE/BANDAGES/DRESSINGS) ×1 IMPLANT
GLOVE BIO SURGEON STRL SZ 6.5 (GLOVE) ×1 IMPLANT
GLOVE BIO SURGEON STRL SZ7.5 (GLOVE) ×1 IMPLANT
GLOVE BIOGEL PI IND STRL 7.5 (GLOVE) ×2 IMPLANT
GLOVE ECLIPSE 7.5 STRL STRAW (GLOVE) ×1 IMPLANT
GLOVE EXAM NITRILE LRG STRL (GLOVE) IMPLANT
GLOVE EXAM NITRILE XL STR (GLOVE) IMPLANT
GLOVE EXAM NITRILE XS STR PU (GLOVE) IMPLANT
GLOVE SURG LTX SZ7.5 (GLOVE) ×1 IMPLANT
GOWN STRL REUS W/ TWL LRG LVL3 (GOWN DISPOSABLE) ×2 IMPLANT
GOWN STRL REUS W/ TWL XL LVL3 (GOWN DISPOSABLE) IMPLANT
GOWN STRL REUS W/TWL 2XL LVL3 (GOWN DISPOSABLE) ×1 IMPLANT
GOWN STRL REUS W/TWL LRG LVL3 (GOWN DISPOSABLE) ×3
GOWN STRL REUS W/TWL XL LVL3 (GOWN DISPOSABLE)
HEMOSTAT ARISTA ABSORB 3G PWDR (HEMOSTASIS) IMPLANT
HEMOSTAT POWDER KIT SURGIFOAM (HEMOSTASIS) ×1 IMPLANT
HEMOSTAT SURGICEL 2X14 (HEMOSTASIS) IMPLANT
IV NS 1000ML (IV SOLUTION) ×2
IV NS 1000ML BAXH (IV SOLUTION) ×2 IMPLANT
KIT BASIN OR (CUSTOM PROCEDURE TRAY) ×2 IMPLANT
KIT DRAIN CSF ACCUDRAIN (MISCELLANEOUS) ×1 IMPLANT
KIT TURNOVER KIT B (KITS) ×2 IMPLANT
KNIFE ARACHNOID DISP AM-21-S (BLADE) IMPLANT
MARKER SKIN DUAL TIP RULER LAB (MISCELLANEOUS) ×1 IMPLANT
NDL HYPO 18GX1.5 BLUNT FILL (NEEDLE) ×1 IMPLANT
NDL HYPO 25GX1X1/2 BEV (NEEDLE) ×2 IMPLANT
NDL HYPO 25X1 1.5 SAFETY (NEEDLE) ×1 IMPLANT
NDL SPNL 22GX3.5 QUINCKE BK (NEEDLE) ×1 IMPLANT
NDL SPNL 25GX3.5 QUINCKE BL (NEEDLE) ×1 IMPLANT
NEEDLE HYPO 18GX1.5 BLUNT FILL (NEEDLE) ×1
NEEDLE HYPO 25GX1X1/2 BEV (NEEDLE) ×2
NEEDLE HYPO 25X1 1.5 SAFETY (NEEDLE) ×1
NEEDLE SPNL 22GX3.5 QUINCKE BK (NEEDLE) ×1
NEEDLE SPNL 25GX3.5 QUINCKE BL (NEEDLE) ×1
NS IRRIG 1000ML POUR BTL (IV SOLUTION) ×2 IMPLANT
PACK LAMINECTOMY ORTHO (CUSTOM PROCEDURE TRAY) IMPLANT
PAD ARMBOARD 7.5X6 YLW CONV (MISCELLANEOUS) ×3 IMPLANT
PATTIES SURGICAL .25X.25 (GAUZE/BANDAGES/DRESSINGS) IMPLANT
PATTIES SURGICAL .5 X.5 (GAUZE/BANDAGES/DRESSINGS) ×1 IMPLANT
PATTIES SURGICAL .5 X3 (DISPOSABLE) ×2 IMPLANT
PENCIL BUTTON HOLSTER BLD 10FT (ELECTRODE) ×1 IMPLANT
PENCIL SMOKE EVACUATOR (MISCELLANEOUS) ×1 IMPLANT
POSITIONER HEAD DONUT 9IN (MISCELLANEOUS) ×1 IMPLANT
PROBE FOR NEUROSURGERY (MISCELLANEOUS) IMPLANT
SEALANT ADHERUS EXTEND TIP (MISCELLANEOUS) IMPLANT
SHEATH ENDOSCRUB 0 DEG (SHEATH) ×1 IMPLANT
SHEATH ENDOSCRUB 30 DEG (SHEATH) IMPLANT
SHEATH ENDOSCRUB 45 DEG (SHEATH) IMPLANT
SOL ANTI FOG 6CC (MISCELLANEOUS) IMPLANT
SPECIMEN JAR SMALL (MISCELLANEOUS) IMPLANT
SPLINT NASAL DOYLE BI-VL (GAUZE/BANDAGES/DRESSINGS) IMPLANT
SPLINT NASAL POSISEP X .6X2 (GAUZE/BANDAGES/DRESSINGS) IMPLANT
SPLINT NASAL POSISEP X2 .8X2.3 (GAUZE/BANDAGES/DRESSINGS) IMPLANT
SPONGE SURGIFOAM ABS GEL SZ50 (HEMOSTASIS) IMPLANT
SPONGE T-LAP 18X18 ~~LOC~~+RFID (SPONGE) IMPLANT
SPONGE T-LAP 4X18 ~~LOC~~+RFID (SPONGE) ×1 IMPLANT
STAPLER SKIN PROX WIDE 3.9 (STAPLE) ×1 IMPLANT
STAPLER VISISTAT 35W (STAPLE) ×1 IMPLANT
STRIP CLOSURE SKIN 1/2X4 (GAUZE/BANDAGES/DRESSINGS) ×1 IMPLANT
SUCTION TUBE FRAZIER 10FR DISP (SUCTIONS) ×1 IMPLANT
SUT BONE WAX W31G (SUTURE) ×1 IMPLANT
SUT ETHILON 3 0 FSL (SUTURE) IMPLANT
SUT ETHILON 3 0 PS 1 (SUTURE) IMPLANT
SUT ETHILON 6 0 P 1 (SUTURE) IMPLANT
SUT PDS AB 4-0 RB1 27 (SUTURE) IMPLANT
SUT PDS PLUS AB 5-0 RB-1 (SUTURE) IMPLANT
SUT PLAIN 4 0 ~~LOC~~ 1 (SUTURE) IMPLANT
SUT VIC AB 4-0 P-3 18X BRD (SUTURE) IMPLANT
SUT VIC AB 4-0 P3 18 (SUTURE)
SYR CONTROL 10ML LL (SYRINGE) ×1 IMPLANT
SYR TB 1ML LUER SLIP (SYRINGE) ×2 IMPLANT
TOWEL GREEN STERILE (TOWEL DISPOSABLE) ×1 IMPLANT
TOWEL GREEN STERILE FF (TOWEL DISPOSABLE) ×2 IMPLANT
TRACKER ENT INSTRUMENT (MISCELLANEOUS) ×2 IMPLANT
TRACKER ENT PATIENT (MISCELLANEOUS) ×1 IMPLANT
TRAP SPECIMEN MUCUS 40CC (MISCELLANEOUS) IMPLANT
TRAY ENT MC OR (CUSTOM PROCEDURE TRAY) ×2 IMPLANT
TRAY FOLEY MTR SLVR 16FR STAT (SET/KITS/TRAYS/PACK) ×1 IMPLANT
TUBE CONNECTING 12X1/4 (SUCTIONS) ×1 IMPLANT
TUBING EXTENTION W/L.L. (IV SETS) IMPLANT
TUBING FEATHERFLOW (TUBING) IMPLANT
TUBING STRAIGHTSHOT EPS 5PK (TUBING) ×1 IMPLANT
WATER STERILE IRR 1000ML POUR (IV SOLUTION) ×2 IMPLANT

## 2023-04-14 NOTE — Discharge Instructions (Signed)
[facility] ([facility]) Post Operative Instructions  Office: [phone]  The Surgery Itself Endoscopic sinus surgery (with or without septoplasty and turbinate reduction) involves general anesthesia, typically for one to two hours. Patients may be sedated for several hours after surgery and may remain sleepy for the better part of the day. Nausea and vomiting are occasionally seen, and usually resolve by the evening of surgery - even without additional medications.   After Surgery  Facial pressure and fullness similar to a sinus infection/headache is normal after surgery. Breathing through your nose is also difficult due to swelling. A humidifier or vaporizer can be used in the bedroom to prevent throat pain with mouth breathing.   Bloody nasal drainage is normal after this surgery for 5-7 days, usually decreasing in volume with each day that passes. Drainage will flow from the front of the nose and down the back of the throat. Make sure you spit out blood drainage that drips down the back of your throat to prevent nausea/vomiting. You will have a nasal drip pad/sling with gauze to catch drainage from the front of your nose. The dressing may need to be changed frequently during the first 24 hours following surgery. In case of profuse nasal bleeding, you may apply ice to the bridge of the nose and pinch the nose just above the tip and hold for 10 minutes; if bleeding continues, contact the doctors office.   Frequent hot showers or saline nasal rinses (NeilMed) will help break up congestion and clear any clot or mucus that builds up within the nose after surgery. This can be started the day after surgery.   It is more comfortable to sleep with extra pillows or in a recliner for the first few days after surgery until the drainage begins to resolve.    Do not blow your nose for 2 weeks after surgery, or until cleared by your surgeon.   Avoid lifting > 10 lbs. and no vigorous exercise  for 2 weeks after surgery.   Avoid airplane travel for 2 weeks following sinus surgery; the cabin pressure changes can cause pain and swelling within the nose/sinuses.   Sense of smell and taste are often diminished for several weeks after surgery. There may be some tenderness or numbness in your upper front teeth, which is normal after surgery. You may express old clot, discolored mucus or very large nasal crusts from your nose for up to 3-4 weeks after surgery; depending on how frequently and how effectively you irrigate your nose with the saltwater spray.   You may have absorbable sutures inside of your nose after surgery that will slowly dissolve in 2-3 weeks. Be careful when clearing crusts from the nose since they may be attached to these sutures.  Medications  Pain medication can be used for pain as prescribed. Pain and pressure in the nose is expected after surgery. As the surgical site heals, pain will resolve over the course of a week. Pain medications can cause nausea, which can be prevented if you take them with food or milk.   You may be given an antibiotic for one week after surgery to prevent infection. Take this medication with food to prevent nausea or vomiting.   You can use 2 nasal sprays after surgery: Afrin can be used up to 2 times a day for up to 5 days after surgery (best before bed) to reduce bloody drainage from the nose for the first few days after surgery. Saline/salt water spray should  be used at least 4-6 times per day, starting the day after surgery to prevent crusting inside of the nose.   Take all of your routine medications as prescribed, unless told otherwise by your surgeon. Any medications that thin the blood should be avoided. This includes aspirin. Avoid aspirin-like products for the first 72 hours after surgery (Advil, Motrin, Excedrin, Alieve, Celebrex, Naprosyn), but you may use them as needed for pain after 72 hours.

## 2023-04-14 NOTE — Consult Note (Signed)
NAME:  Alexis Kramer, MRN:  [id number], DOB:  September 13, 1940, LOS: 0 ADMISSION DATE:  04/14/2023, CONSULTATION DATE:  04/14/23 REFERRING MD:  Maurice Small CHIEF COMPLAINT:  Post Op Management   History of Present Illness:  Alexis Kramer is a 82 y.o. female who has a PMH as below including recent progressive visual field loss noticed by her ophthalmologist. Workup revealed a large non-functioning pituitary adenoma. She was referred to Neurosurgery for surgical consultation.  On 04/14/23, she presented to [facility] for elective endonasal endoscopic transphenoidal resection of pituitary tumor and resection of right concha bullosa (Dr. Maurice Small and Dr. Marene Lenz)  Post operatively, she was admitted to the neuro ICU for close observation. PCCM consulted for medical management.  Pertinent  Medical History:  has Primary osteoarthritis of right knee; Status post transsphenoidal pituitary resection (HCC); and Pituitary adenoma (HCC) on their problem list.  Significant Hospital Events: Including procedures, antibiotic start and stop dates in addition to other pertinent events   11/6 admit for OR  Interim History / Subjective:  Stable post op. Has slight headache but stable. No additional complaints.  Objective:  Blood pressure 121/71, pulse 65, temperature (!) 96.9 F (36.1 C), temperature source Axillary, resp. rate 12, height 5' (1.524 m), weight 59 kg, SpO2 100%.        Intake/Output Summary (Last 24 hours) at 04/14/2023 1456 Last data filed at 04/14/2023 1342 Gross per 24 hour  Intake 2300 ml  Output 1840 ml  Net 460 ml   Filed Weights   04/14/23 0714  Weight: 59 kg    Examination: General: Adult female, resting in bed, in NAD. Neuro: A&O x 3, no deficits. HEENT: [place]/AT. Sclerae anicteric. EOMI. Nasal dressing in place.. Cardiovascular: RRR, no M/R/G.  Lungs: Respirations even and unlabored.  CTA bilaterally, No W/R/R. Abdomen: BS x 4, soft, NT/ND.  Musculoskeletal: No gross  deformities, no edema.  Skin: Intact, warm, no rashes.  Assessment & Plan:   Non functioning pituitary adenoma - s/p endonasal endoscopic transphenoidal resection of pituitary tumor and resection of right concha bullosa (Dr. Maurice Small and Dr. Marene Lenz) on 04/14/23. - Post op care per NSGY. - Pain control.  Insomnia. - Continue PTA Trazodone.  Best practice (evaluated daily):  Diet/type: Regular consistency (see orders) DVT prophylaxis: prophylactic heparin  GI prophylaxis: N/A Lines: N/A Foley:  N/A Code Status:  full code Last date of multidisciplinary goals of care discussion: None yet.  Labs   CBC: No results for input(s): "WBC", "NEUTROABS", "HGB", "HCT", "MCV", "PLT" in the last 168 hours.  Basic Metabolic Panel: No results for input(s): "NA", "K", "CL", "CO2", "GLUCOSE", "BUN", "CREATININE", "CALCIUM", "MG", "PHOS" in the last 168 hours. GFR: Estimated Creatinine Clearance: 44.3 mL/min (by C-G formula based on SCr of 0.77 mg/dL). No results for input(s): "PROCALCITON", "WBC", "LATICACIDVEN" in the last 168 hours.  Liver Function Tests: No results for input(s): "AST", "ALT", "ALKPHOS", "BILITOT", "PROT", "ALBUMIN" in the last 168 hours. No results for input(s): "LIPASE", "AMYLASE" in the last 168 hours. No results for input(s): "AMMONIA" in the last 168 hours.  ABG No results found for: "PHART", "PCO2ART", "PO2ART", "HCO3", "TCO2", "ACIDBASEDEF", "O2SAT"   Coagulation Profile: No results for input(s): "INR", "PROTIME" in the last 168 hours.  Cardiac Enzymes: No results for input(s): "CKTOTAL", "CKMB", "CKMBINDEX", "TROPONINI" in the last 168 hours.  HbA1C: No results found for: "HGBA1C"  CBG: No results for input(s): "GLUCAP" in the last 168 hours.  Review of Systems:   All negative; except for those that  are bolded, which indicate positives.  Constitutional: weight loss, weight gain, night sweats, fevers, chills, fatigue, weakness.  HEENT: headaches, sore  throat, sneezing, nasal congestion, post nasal drip, difficulty swallowing, tooth/dental problems, visual complaints, visual changes, ear aches. Neuro: difficulty with speech, weakness, numbness, ataxia. CV:  chest pain, orthopnea, PND, swelling in lower extremities, dizziness, palpitations, syncope.  Resp: cough, hemoptysis, dyspnea, wheezing. GI: heartburn, indigestion, abdominal pain, nausea, vomiting, diarrhea, constipation, change in bowel habits, loss of appetite, hematemesis, melena, hematochezia.  GU: dysuria, change in color of urine, urgency or frequency, flank pain, hematuria. MSK: joint pain or swelling, decreased range of motion. Psych: change in mood or affect, depression, anxiety, suicidal ideations, homicidal ideations. Skin: rash, itching, bruising.   Past Medical History:  She,  has a past medical history of Anxiety, Arthritis, Breast cancer (HCC), Cancer (HCC) (1994), Depression, Dyspnea, and GERD (gastroesophageal reflux disease).   Surgical History:   Past Surgical History:  Procedure Laterality Date   ABDOMINAL HYSTERECTOMY     APPENDECTOMY     EYE SURGERY Bilateral 2021   MASTECTOMY Left 1997   TONSILLECTOMY     TOTAL KNEE ARTHROPLASTY Right 09/12/2019   Procedure: RIGHT TOTAL KNEE ARTHROPLASTY;  Surgeon: Marcene Corning, MD;  Location: [facility] ORS;  Service: Orthopedics;  Laterality: Right;     Social History:   reports that she has never smoked. She has never used smokeless tobacco. She reports that she does not drink alcohol and does not use drugs.   Family History:  Her family history includes Breast cancer in her sister; Breast cancer (age of onset: 35) in her sister.   Allergies No Known Allergies   Home Medications  Prior to Admission medications   Medication Sig Start Date End Date Taking? Authorizing Provider  carboxymethylcellulose (REFRESH PLUS) 0.5 % SOLN Place 1 drop into both eyes 3 (three) times daily as needed.   Yes [provider]   Cholecalciferol (VITAMIN D3) 50 MCG (2000 UT) TABS Take 2,000 Units by mouth daily.   Yes [provider]  pravastatin (PRAVACHOL) 80 MG tablet Take 80 mg by mouth daily. 08/24/19  Yes [provider]  traMADol (ULTRAM) 50 MG tablet Take 50 mg by mouth daily as needed for pain. 08/26/19  Yes [provider]  traZODone (DESYREL) 100 MG tablet Take 100 mg by mouth at bedtime. 06/16/19  Yes [provider]     Rutherford Guys, PA - [facility] For pager details, please see AMION or use Epic chat  After 1900, please call [facility] for cross coverage needs 04/14/2023, 2:56 PM

## 2023-04-14 NOTE — H&P (Signed)
Surgical H&P Update  HPI: 82 y.o. with a history of progressive visual loss noticed by her ophthalmologist. Workup showed a large non-functioning pituitary adenoma. No changes in health since they were last seen. Still having the above and wishes to proceed with surgery.  PMHx:  Past Medical History:  Diagnosis Date   Anxiety    Arthritis    both knees   Breast cancer (HCC)    Cancer (HCC) 1994   breast cancer ; left side   Depression    situational   Dyspnea    GERD (gastroesophageal reflux disease)    FamHx:  Family History  Problem Relation Age of Onset   Breast cancer Sister 71   Breast cancer Sister    SocHx:  reports that she has never smoked. She has never used smokeless tobacco. She reports that she does not drink alcohol and does not use drugs.  Physical Exam: Awake/alert, PERRL, VF with dense bitemporal hemianopsia, strength 5/5x4  Assesment/Plan: 82 y.o. woman with nonfunctioning pituitary tumor with visual loss, here for endoscopic endonasal resection. Risks, benefits, and alternatives discussed and the patient would like to continue with surgery.  -OR today -4N ICU post-op  Jadene Pierini, MD 04/14/23 8:25 AM

## 2023-04-14 NOTE — Transfer of Care (Signed)
Immediate Anesthesia Transfer of Care Note  Patient: Alexis Kramer  Procedure(s) Performed: Endoscopic endonasal resection of pituitary tumor TRANSNASAL APPROACH  Patient Location: PACU  Anesthesia Type:General  Level of Consciousness: drowsy  Airway & Oxygen Therapy: Patient Spontanous Breathing and Patient connected to face mask oxygen  Post-op Assessment: Report given to RN, Post -op Vital signs reviewed and stable, and Patient moving all extremities X 4  Post vital signs: Reviewed and stable  Last Vitals:  Vitals Value Taken Time  BP 140/58 04/14/23 1205  Temp    Pulse 66 04/14/23 1205  Resp 14 04/14/23 1205  SpO2 100 % 04/14/23 1205  Vitals shown include unfiled device data.  Last Pain:  Vitals:   04/14/23 0714  TempSrc: Oral         Complications: No notable events documented.

## 2023-04-14 NOTE — Plan of Care (Signed)
  Problem: Education: Goal: Knowledge of the prescribed therapeutic regimen will improve Outcome: Progressing   Problem: Clinical Measurements: Goal: Usual level of consciousness will be regained or maintained. Outcome: Progressing Goal: Neurologic status will improve Outcome: Progressing Goal: Ability to maintain intracranial pressure will improve Outcome: Progressing   Problem: Skin Integrity: Goal: Demonstration of wound healing without infection will improve Outcome: Progressing

## 2023-04-14 NOTE — Op Note (Signed)
OPERATIVE NOTE  TIKI TUCCIARONE Date/Time of Admission: 04/14/2023  6:49 AM  CSN: [id number];XLK:[id number] Attending Provider: Jadene Pierini, MD Room/Bed: MCPO/NONE DOB: 10/24/1940 Age: 82 y.o.   Pre-Op Diagnosis: Pituitary tumor  Post-Op Diagnosis: Pituitary tumor  Procedure: Procedure(s): Endoscopic endonasal transsphenoidal resection of pituitary tumor with image guidance Resection of right concha bullosa  Anesthesia: General  Co-Surgeon(s): Beyonce Sawatzky A Binyamin Nelis, DO Autumn Patty, MD   Staff: Circulator: Jerral Ralph, RN Scrub Person: Jorge Ny T Vendor Representative : Debbe Mounts Circulator Assistant: Jeronimo Greaves, RN  Implants: * No implants in log *  Specimens: ID Type Source Tests Collected by Time Destination  1 : Pituitary tumor Tissue PATH Other SURGICAL PATHOLOGY Jadene Pierini, MD 04/14/2023 1050     Complications: None  EBL: 400 ML  Condition: stable  Operative Findings:  Right concha bullosa, relatively midline nasal septum, pituitary tumor  Description of Operation: Once operative consent was obtained and the site and surgery were confirmed with the patient and the operating room team, the patient was brought back to the operating room and general endotracheal anesthesia was obtained. The patient was then turned over to the ENT service, at which time the image-guided system was attached and noted to be in good calibration. Lidocaine 1% with 1:100,000 epinephrine was injected into the inferior turbinates bilaterally, the middle turbinates bilaterally, and the axilla between the medial turbinate and the lateral nasal wall. Afrin-soaked pledgets were placed into the nasal cavity, and the patient was prepped and draped in sterile fashion. Attention was then turned to the right nasal vestibule. The right concha bullosa was removed using a sickle knife and endoscopic scissor. The inferior and middle turbinate  were then gently lateralized using a Cottle elevator until the superior turbinate was identified.  This was also lateralized until the natural sinus ostium was appreciated.  This was serially dilated using a mushroom punch and an up-biting Kerrison.  A posterior septectomy was then performed using a combination of a Cottle elevator, Tru-Cut forceps and Kerrisons.  The nasoseptal flap pedicle was carefully preserved. The left middle turbinate and superior turbinate were then identified and gently lateralized until the natural sphenoid os was identified and serially dilated using the sphenoid dilator, mushroom punch and up-biting Kerrisons.  With the bilateral sphenoid cavities in view, the remaining bone of the sphenoid face was removed using Kerrison forceps.  A diamond bur drill was also used to complete the bony resection and to remove the intrasinus septum until the bone overlying the pituitary adenoma was completely visualized.  This bone was removed using Kerrison forceps until the tumor was completely visualized. Dr. Johnsie Cancel then scrubbed in and completely resected the tumor, please see his operative note for details regarding the repair.  There was no evidence of CSF leak following tumor removal. Floseal was placed in the defect and Adheris was used to completely cover the defect. Arista and Posisep nasal packing was placed in bilateral nasal cavities in the sphenoid defect.  An orogastric tube was placed and the stomach cavity was suctioned to reduce postoperative nausea. The patient was turned over to anesthesia service and was extubated in the operating room and transferred to the PACU in stable condition.    Laren Boom, DO [facility]  04/14/2023

## 2023-04-14 NOTE — Op Note (Signed)
PATIENT: Alexis Kramer  DAY OF SURGERY: 04/14/23   PRE-OPERATIVE DIAGNOSIS:  Pituitary adenoma   POST-OPERATIVE DIAGNOSIS:  Pituitary adenoma   PROCEDURE:  Endonasal endoscopic transphenoidal resection of pituitary tumor   SURGEON:  Surgeon(s) and Role:    Jadene Pierini, MD - Co-surgeon    Cheron Schaumann, MD - Co-surgeon   ANESTHESIA: ETGA   BRIEF HISTORY: This is an 82 year old woman who presented with progressive bitemporal hemianopsia noticed by her ophthalmologist, MRI showed a large pituitary adenoma, non-functioning on lab work, I therefore recommended resection via endonasal route.    OPERATIVE DETAIL: Dr. Marene Lenz performed the nasal portion of the approach and is dictated separately.   For the neurosurgical portion of the case, Dr. Marene Lenz and I used a combination of visual anatomic landmarks and frameless stereotaxy to confirm orientation and anatomy. The dura of the sella was then opened sharply with a #11 blade in a cruciate fashion. Tumor was immediately encountered and samples were taken and sent to pathology for analysis. With Dr. Anders Simmonds assistance while holding the endoscope, the tumor was then resected in a deliberate fashion with bimanual technique using a combination of ring curettes, suction, and penfield #4. It was quite vascular and soft, which make resection more difficult. After clearing the walls of tumor with a ringed curette, the diaphragma descended into the field and no obvious tumor was remaining. There was no evidence of CSF leakage. Hemostasis was confirmed and Adheris ([employer]ryker) was used to fill the sella. Dr. Marene Lenz then took over to complete the skull base reconstruction portion of the procedure.   EBL:    DRAINS: none   SPECIMENS: Pituitary tumor   Jadene Pierini, MD 04/14/23 9:05 AM

## 2023-04-14 NOTE — Anesthesia Procedure Notes (Addendum)
Procedure Name: Intubation Date/Time: 04/14/2023 10:04 AM  Performed by: Sandie Ano, CRNAPre-anesthesia Checklist: Patient identified, Emergency Drugs available, Suction available and Patient being monitored Patient Re-evaluated:Patient Re-evaluated prior to induction Oxygen Delivery Method: Circle System Utilized Preoxygenation: Pre-oxygenation with 100% oxygen Induction Type: IV induction Ventilation: Mask ventilation without difficulty Laryngoscope Size: Mac and 3 Grade View: Grade II Tube type: Oral Tube size: 7.0 mm Number of attempts: 1 Airway Equipment and Method: Stylet and Oral airway Placement Confirmation: ETT inserted through vocal cords under direct vision, positive ETCO2 and breath sounds checked- equal and bilateral Secured at: 21 cm Tube secured with: Tape Dental Injury: Teeth and Oropharynx as per pre-operative assessment  Comments: Intubation by Roe Coombs under supervision by MD Sampson Goon

## 2023-04-14 NOTE — Anesthesia Procedure Notes (Signed)
Arterial Line Insertion Start/End11/11/2022 7:50 AM, 04/14/2023 7:55 AM Performed by: Marcene Duos, MD, Sandie Ano, CRNA, CRNA  Patient location: Pre-op. Preanesthetic checklist: patient identified, IV checked, site marked, risks and benefits discussed, surgical consent, monitors and equipment checked, pre-op evaluation, timeout performed and anesthesia consent Lidocaine 1% used for infiltration radial was placed Catheter size: 20 G Hand hygiene performed  and maximum sterile barriers used   Attempts: 1 Procedure performed without using ultrasound guided technique. Following insertion, dressing applied. Post procedure assessment: normal and unchanged  Patient tolerated the procedure well with no immediate complications.

## 2023-04-15 ENCOUNTER — Encounter (HOSPITAL_COMMUNITY): Payer: Self-pay | Admitting: Neurological Surgery

## 2023-04-15 DIAGNOSIS — I959 Hypotension, unspecified: Secondary | ICD-10-CM

## 2023-04-15 DIAGNOSIS — E893 Postprocedural hypopituitarism: Secondary | ICD-10-CM | POA: Diagnosis not present

## 2023-04-15 LAB — GLUCOSE, CAPILLARY
Glucose-Capillary: 220 mg/dL — ABNORMAL HIGH (ref 70–99)
Glucose-Capillary: 194 mg/dL — ABNORMAL HIGH (ref 70–99)
Glucose-Capillary: 151 mg/dL — ABNORMAL HIGH (ref 70–99)
Glucose-Capillary: 192 mg/dL — ABNORMAL HIGH (ref 70–99)

## 2023-04-15 LAB — RENAL FUNCTION PANEL
Albumin: 2.9 g/dL — ABNORMAL LOW (ref 3.5–5.0)
Anion gap: 9 (ref 5–15)
BUN: 11 mg/dL (ref 8–23)
CO2: 19 mmol/L — ABNORMAL LOW (ref 22–32)
Calcium: 7.6 mg/dL — ABNORMAL LOW (ref 8.9–10.3)
Chloride: 111 mmol/L (ref 98–111)
Creatinine, Ser: 1.26 mg/dL — ABNORMAL HIGH (ref 0.44–1.00)
GFR, Estimated: 43 mL/min — ABNORMAL LOW (ref >60)
Glucose, Bld: 220 mg/dL — ABNORMAL HIGH (ref 70–99)
Phosphorus: 2.1 mg/dL — ABNORMAL LOW (ref 2.5–4.6)
Potassium: 3.7 mmol/L (ref 3.5–5.1)
Sodium: 139 mmol/L (ref 135–145)

## 2023-04-15 LAB — HEMOGLOBIN A1C
Hgb A1c MFr Bld: 5.7 % — ABNORMAL HIGH (ref 4.8–5.6)
Mean Plasma Glucose: 116.89 mg/dL

## 2023-04-15 LAB — TSH: TSH: 1.275 u[IU]/mL (ref 0.350–4.500)

## 2023-04-15 LAB — CORTISOL: Cortisol, Plasma: >100 ug/dL

## 2023-04-15 LAB — T4, FREE: Free T4: 0.59 ng/dL — ABNORMAL LOW (ref 0.61–1.12)

## 2023-04-15 MED ORDER — SODIUM CHLORIDE 0.9 % IV SOLN: 250.0000 mL | INTRAVENOUS | Status: AC

## 2023-04-15 MED ORDER — INSULIN ASPART 100 UNIT/ML IJ SOLN
0.0000 [IU] | Freq: Three times a day (TID) | INTRAMUSCULAR | Status: DC
  Administered 2023-04-15 (×2): 3 [IU] via SUBCUTANEOUS
  Administered 2023-04-15: 5 [IU] via SUBCUTANEOUS
  Administered 2023-04-16: 3 [IU] via SUBCUTANEOUS
  Administered 2023-04-16: 2 [IU] via SUBCUTANEOUS

## 2023-04-15 MED ORDER — MIDODRINE HCL 5 MG PO TABS
10.0000 mg | ORAL_TABLET | Freq: Three times a day (TID) | ORAL | Status: DC
  Administered 2023-04-15 (×2): 10 mg via ORAL
  Filled 2023-04-15 (×3): qty 2

## 2023-04-15 MED ORDER — HYDROCORTISONE SOD SUC (PF) 100 MG IJ SOLR
100.0000 mg | Freq: Three times a day (TID) | INTRAMUSCULAR | Status: DC
  Administered 2023-04-15 – 2023-04-16 (×4): 100 mg via INTRAVENOUS
  Filled 2023-04-15 (×4): qty 2

## 2023-04-15 MED ORDER — NOREPINEPHRINE 4 MG/250ML-% IV SOLN
2.0000 ug/min | INTRAVENOUS | Status: DC
  Administered 2023-04-15: 2 ug/min via INTRAVENOUS
  Filled 2023-04-15: qty 250

## 2023-04-15 NOTE — Progress Notes (Signed)
PT Evaluation  Pt admitted with/for visual field loss work up and found a pituitary adenoma.  Post resection, pt needing CGA for mobility and gait with use of the RW for safety.  Pt currently limited functionally due to the problems listed below.  (see problems list.)  Pt will benefit from PT to maximize function and safety to be able to get home safely with available assist .    04/15/23 1215  PT Visit Information  Last PT Received On 04/15/23  Assistance Needed +1  History of Present Illness [name] is a 82 y.o. female with recent progressive visual field loss noticed by her ophthalmologist. Workup revealed a large non-functioning pituitary adenoma. 04/14/23, she presented to [facility] for elective endonasal endoscopic transphenoidal resection of pituitary tumor and resection of right concha bullosa (Dr. Maurice Small and Dr. Marene Lenz). PHMx: OA both knees, breast CA (L), anxiety, GERD.  Precautions  Precautions Fall  Home Living  Family/patient expects to be discharged to: Private residence  Living Arrangements Alone  Available Help at Discharge Family (god children to come stay short term and prn after)  Type of Home House  Home Access Stairs to enter  Entrance Stairs-Number of Steps 0/several  Entrance Stairs-Rails Left;Right  Home Layout One level  Bathroom Shower/Tub Walk-in shower;Curtain  Bathroom Toilet Handicapped height  Bathroom Accessibility Yes  Home Equipment [place] - single point;Shower seat  Additional Comments Has not driven "much" in last 3 month when this vision issue started.  Pt states she drives straight [place] [employer] [place].  Prior Function  Prior Level of Function  Independent/Modified Independent  Mobility Comments Mod I to independent in the home with/without SPC  ADLs Comments Independent to Mod I  Pain Assessment  Pain Assessment No/denies pain  Cognition  Arousal Alert  Behavior During Therapy WFL for tasks assessed/performed  Overall Cognitive Status  Within Functional Limits for tasks assessed  Communication  Communication No apparent difficulties  Upper Extremity Assessment  Upper Extremity Assessment Left hand dominant  Lower Extremity Assessment  Lower Extremity Assessment Overall WFL for tasks assessed (general weakness from inactivity,)  Cervical / Trunk Assessment  Cervical / Trunk Assessment Normal  Bed Mobility  Overal bed mobility Modified Independent  General bed mobility comments no use of rail, HOB low  Transfers  Overall transfer level Needs assistance  Transfers Sit to/from Stand  Sit to Stand Contact guard assist  General transfer comment cues for safe hand placement otherwise no assist  Ambulation/Gait  Ambulation/Gait assistance Contact guard assist  Gait Distance (Feet) 200 Feet  Assistive device Rolling walker (2 wheels)  Gait Pattern/deviations Step-through pattern  General Gait Details noticeable limp /antalgic gait that improved with time up.  Generally stable, but pt notices weakness and agreed needed the RW today.  Gait velocity interpretation <1.8 ft/sec, indicate of risk for recurrent falls  Balance  Overall balance assessment Mild deficits observed, not formally tested  PT - End of Session  Activity Tolerance Patient tolerated treatment well  Patient left Other (comment) (with OT)  Nurse Communication Mobility status  PT Assessment  PT Recommendation/Assessment Patient needs continued PT services  PT Visit Diagnosis Unsteadiness on feet (R26.81);Other symptoms and signs involving the nervous system (R29.898);Other abnormalities of gait and mobility (R26.89)  PT Problem List Decreased strength;Decreased activity tolerance;Decreased balance;Decreased mobility;Decreased knowledge of use of DME  PT Plan  PT Frequency (ACUTE ONLY) Min 1X/week  PT Treatment/Interventions (ACUTE ONLY) DME instruction;Gait training;Functional mobility training;Therapeutic activities;Balance training;Patient/family  education  AM-PAC PT "  6 Clicks" Mobility Outcome Measure (Version 2)  Help needed turning from your back to your side while in a flat bed without using bedrails? 3  Help needed moving from lying on your back to sitting on the side of a flat bed without using bedrails? 3  Help needed moving to and from a bed to a chair (including a wheelchair)? 3  Help needed standing up from a chair using your arms (e.g., wheelchair or bedside chair)? 3  Help needed to walk in hospital room? 3  Help needed climbing 3-5 steps with a railing?  3  6 Click Score 18  Consider Recommendation of Discharge To: Home with [facility]  Progressive Mobility  What is the highest level of mobility based on the progressive mobility assessment? Level 4 (Walks with assist in room) - Balance while marching in place and cannot step forward and back - Complete  Mobility Referral Yes  Activity Ambulated with assistance in hallway  PT Recommendation  Follow Up Recommendations Outpatient PT  Patient can return home with the following A little help with bathing/dressing/bathroom;Assist for transportation;Assistance with cooking/housework  Functional Status Assessment Patient has had a recent decline in their functional status and demonstrates the ability to make significant improvements in function in a reasonable and predictable amount of time.  PT equipment Rolling walker (2 wheels)  Individuals Consulted  Consulted and Agree with Results and Recommendations Patient  Acute Rehab PT Goals  Patient Stated Goal back independent  PT Goal Formulation With patient  Time For Goal Achievement 04/29/23  Potential to Achieve Goals Good  PT Time Calculation  PT Start Time (ACUTE ONLY) 1149  PT Stop Time (ACUTE ONLY) 1210  PT Time Calculation (min) (ACUTE ONLY) 21 min  PT General Charges  $$ ACUTE PT VISIT 1 Visit  PT Evaluation  $PT Eval Moderate Complexity 1 Mod    04/15/2023  Jacinto Halim., PT Acute Rehabilitation Services [phone]   (office)

## 2023-04-15 NOTE — Progress Notes (Addendum)
Neurosurgery Service Progress Note  Subjective: No acute events overnight, some nasal soreness but well tolerated, vision subjectively grossly stable to preop   Objective: Vitals:   04/15/23 0630 04/15/23 0635 04/15/23 0700 04/15/23 0715  BP: (!) 88/46 (!) 98/46 (!) 99/50 (!) 92/52  Pulse: 68 67 64 67  Resp: 18 17 14 14   Temp:      TempSrc:      SpO2: 95% 96% 93% 93%  Weight:      Height:        Physical Exam: Aox3, PERRL, EOMI, FS & SS, VF with bitemporal hemianopsia, Strength 5/5 x4 and SILTx4   Assessment & Plan: 82 y.o. woman s/p endoscopic endonasal pituitary adenoma resection, recovering well.  -had some NE started overnight for some low Bps, asymptomatic, mainly low diastolic contributing to MAP, CCM recs, UOP 350 overnight / no tachycardia so no concern for DI, no other sx c/f addison's, AM cortisol already ordered - okay from my standpoint to transfer out of unit when off pressors -will add on a FT4 given the hypotension and preop borderline T4 level -activity as tolerated -PT/OT  Jadene Pierini  04/15/23 7:34 AM

## 2023-04-15 NOTE — Discharge Summary (Signed)
Physician Discharge Summary  Patient ID: Alexis Kramer MRN: [id number] DOB/AGE: [birth year] 80 y.o.  Admit date: 04/14/2023 Discharge date: 04/16/2023  Admission Diagnoses:  Pituitary adenoma  Discharge Diagnoses:  Same Principal Problem:   Status post transsphenoidal pituitary resection [facility]) Active Problems:   Pituitary adenoma [facility])   Discharged Condition: Stable  Hospital Course:  Alexis Kramer is a 82 y.o. female admitted after elective transphenoidal resection of tumor. She was at neurologic baseline postop. She had stable by typical hemianopsia. She had some mild hypotension which resolved. No evidence of hypopituitarism or diabetes insipidus. She was discharged home in stable condition.  Treatments: Surgery - transsphenoidal resection of tumor  Discharge Exam: Blood pressure (!) 106/57, pulse (!) 58, temperature 97.8 F (36.6 C), temperature source Oral, resp. rate 10, height 5' (1.524 m), weight 59 kg, SpO2 93%. Awake, alert, oriented Speech fluent, appropriate CN grossly intact x stable bitemporal hemianopsia 5/5 BUE/BLE Wound c/d/i  Disposition: Discharge disposition: 01-Home or Self Care       Discharge Instructions     Call MD for:  redness, tenderness, or signs of infection (pain, swelling, redness, odor or green/yellow discharge around incision site)   Complete by: As directed    Call MD for:  temperature >100.4   Complete by: As directed    Diet - low sodium heart healthy   Complete by: As directed    Discharge instructions   Complete by: As directed    Walk at home as much as possible, at least 4 times / day   Increase activity slowly   Complete by: As directed    Lifting restrictions   Complete by: As directed    No lifting > 10 lbs   May shower / Bathe   Complete by: As directed    48 hours after surgery   May walk up steps   Complete by: As directed    No dressing needed   Complete by: As directed    No wound care   Complete by:  As directed    Other Restrictions   Complete by: As directed    No bending/twisting at waist      Allergies as of 04/16/2023   No Known Allergies      Medication List     TAKE these medications    acetaminophen 325 MG tablet Commonly known as: TYLENOL Take 2 tablets (650 mg total) by mouth every 4 (four) hours as needed for mild pain (pain score 1-3) (temp > 100.5).   carboxymethylcellulose 0.5 % Soln Commonly known as: REFRESH PLUS Place 1 drop into both eyes 3 (three) times daily as needed.   oxymetazoline 0.05 % nasal spray Commonly known as: AFRIN Place 1 spray into both nostrils 2 (two) times daily as needed (epistaxis).   pravastatin 80 MG tablet Commonly known as: PRAVACHOL Take 80 mg by mouth daily.   traMADol 50 MG tablet Commonly known as: ULTRAM Take 50 mg by mouth daily as needed for pain.   traZODone 100 MG tablet Commonly known as: DESYREL Take 100 mg by mouth at bedtime.   Vitamin D3 50 MCG (2000 UT) Tabs Take 2,000 Units by mouth daily.               Discharge Care Instructions  (From admission, onward)           Start     Ordered   04/16/23 0000  No dressing needed        04/16/23 1539  Follow-up Information     Skotnicki, Meghan A, DO Follow up on 04/30/2023.   Specialty: Otolaryngology Why: Follow up as scheduled on 11/22 @ 1PM Contact information: [address] [place] [address] [phone]         Jadene Pierini, MD Follow up in 3 week(s).   Specialty: Neurosurgery Contact information: [address] [place] [address] [phone]                 Signed: Jackelyn Hoehn 04/16/2023, 3:42 PM

## 2023-04-15 NOTE — Progress Notes (Signed)
   NAME:  Alexis Kramer, MRN:  [id number], DOB:  06-29-1940, LOS: 1 ADMISSION DATE:  04/14/2023, CONSULTATION DATE:  04/14/23 REFERRING MD:  Maurice Small CHIEF COMPLAINT:  Post Op Management   History of Present Illness:  Alexis Kramer is a 82 y.o. female who has a PMH as below including recent progressive visual field loss noticed by her ophthalmologist. Workup revealed a large non-functioning pituitary adenoma. She was referred to Neurosurgery for surgical consultation.  On 04/14/23, she presented to [facility] for elective endonasal endoscopic transphenoidal resection of pituitary tumor and resection of right concha bullosa (Dr. Maurice Small and Dr. Marene Lenz)  Post operatively, she was admitted to the neuro ICU for close observation. PCCM consulted for medical management.  Pertinent  Medical History:  has Primary osteoarthritis of right knee; Status post transsphenoidal pituitary resection (HCC); and Pituitary adenoma (HCC) on their problem list.  Significant Hospital Events: Including procedures, antibiotic start and stop dates in addition to other pertinent events   11/6 admit for OR 11/6 overnight hypotension, started on steroids and low dose levophed  Interim History / Subjective:  Slight headache currently but feels pain regimen adequate Had some hypotension overnight, started on low dose Levo which she remains on at 4. Cortisol drawn and started on Hydrocortisone  Objective:  Blood pressure (!) 92/52, pulse 67, temperature 98.3 F (36.8 C), temperature source Oral, resp. rate 14, height 5' (1.524 m), weight 59 kg, SpO2 93%.        Intake/Output Summary (Last 24 hours) at 04/15/2023 0727 Last data filed at 04/15/2023 0539 Gross per 24 hour  Intake 2492.43 ml  Output 3190 ml  Net -697.57 ml   Filed Weights   04/14/23 0714  Weight: 59 kg    Examination: General: Adult female, resting in bed, in NAD. Neuro: A&O x 3, no deficits. HEENT: [place]/AT. Sclerae anicteric. EOMI. Nasal  dressing in place, no blood. Cardiovascular: RRR, no M/R/G.  Lungs: Respirations even and unlabored.  CTA bilaterally, No W/R/R. Abdomen: BS x 4, soft, NT/ND.  Musculoskeletal: No gross deformities, no edema.  Skin: Intact, warm, no rashes.  Assessment & Plan:   Non functioning pituitary adenoma - s/p endonasal endoscopic transphenoidal resection of pituitary tumor and resection of right concha bullosa (Dr. Maurice Small and Dr. Marene Lenz) on 04/14/23. - Post op care per NSGY. - Pain control.  Hypotension post resection - not totally unexpected, likely component adrenal insufficiency post op. - Continue low dose NE as needed, anticipate will improve as she mobilizes etc. - F/u on cortisol level. - Continue Solucortef for now. - Check TSH.  Insomnia. - Continue PTA Trazodone.  Best practice (evaluated daily):  Diet/type: Regular consistency (see orders) DVT prophylaxis: prophylactic heparin  GI prophylaxis: N/A Lines: N/A Foley:  N/A Code Status:  full code Last date of multidisciplinary goals of care discussion: None yet.   Rutherford Guys, PA - [facility] For pager details, please see AMION or use Epic chat  After 1900, please call [facility] for cross coverage needs 04/15/2023, 7:39 AM

## 2023-04-15 NOTE — Evaluation (Signed)
Occupational Therapy Evaluation Patient Details Name: Alexis Kramer MRN: [id number] DOB: 1940-12-29 Today's Date: 04/15/2023   History of Present Illness [name] is a 82 y.o. female with recent progressive visual field loss noticed by her ophthalmologist. Workup revealed a large non-functioning pituitary adenoma. 04/14/23, she presented to [facility] for elective endonasal endoscopic transphenoidal resection of pituitary tumor and resection of right concha bullosa (Dr. Maurice Small and Dr. Marene Lenz). PHMx: OA both knees, breast CA (L), anxiety, GERD.   Clinical Impression   [name] admitted and underwent above presents to acute OT with PLOF of being Mod I with all basic ADLs and mobility from a SPC level. Currently she is at a Mod I level for basic ADLs from a RW level. Her decreased vision in periphery of left eye needs to be continued to be addressed with OPOT recommended. Acute OT will sign off.      If plan is discharge home, recommend the following: Assist for transportation    Functional Status Assessment  Patient has had a recent decline in their functional status and demonstrates the ability to make significant improvements in function in a reasonable and predictable amount of time. (without further need for acute OT)  Equipment Recommendations  None recommended by OT       Precautions / Restrictions Precautions Precautions: Fall Restrictions Weight Bearing Restrictions: No      Mobility Bed Mobility               General bed mobility comments: entered when pt already up with PT    Transfers Overall transfer level: Needs assistance   Transfers: Sit to/from Stand Sit to Stand: Contact guard assist           General transfer comment: cues for safe hand placement otherwise no assist      Balance Overall balance assessment: Mild deficits observed, not formally tested (in standing; feel safer with RW than no AD or SPC)                                          ADL either performed or assessed with clinical judgement   ADL Overall ADL's : Modified independent                                        She said she has been driving some (to [place] and back to her house less than a mile). I told her I would not recommend this and she needs to talk to the surgeon about returning to driving. We also talked about safety in new places that the RW would be good to use since if she did run into anything on the floor in her left lower visual field of left eye that the RW would run into if first instead of her tripping/falling over it.     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--maybe going  partially into right inferior quadrant) Additional Comments: left eye vision deficit; sees fine out of right eye            Pertinent Vitals/Pain Pain Assessment Pain Assessment: No/denies pain     Extremity/Trunk Assessment Upper Extremity Assessment Upper Extremity Assessment: Overall WFL for tasks assessed      Communication Communication Communication: No apparent difficulties   Cognition Arousal: Alert Behavior During Therapy: WFL for tasks assessed/performed Overall Cognitive Status: Within Functional Limits for tasks assessed                                                  Home Living Family/patient expects to be discharged to:: Private residence Living Arrangements:  Alone Available Help at Discharge: Family (god children to come stay short term and prn after) Type of Home: House Home Access: Stairs to enter [employer] of Steps: 0/several Entrance Stairs-Rails: Left;Right Home Layout: One level     Bathroom Shower/Tub: Walk-in Pensions consultant: Handicapped height Bathroom Accessibility: Yes   Home Equipment: Cane - single point;Shower seat   Additional Comments: Has not driven "much" in last 3 month when this vision issue started.  Pt states she drives straight [place].      Prior Functioning/Environment Prior Level of Function : Independent/Modified Independent             Mobility Comments: Mod I to independent in the home with/without [facility] ADLs Comments: Independent to Mod I        OT Problem List: Impaired balance (sitting and/or standing);Impaired vision/perception         OT Goals(Current goals can be found in the care plan section) Acute Rehab OT Goals Patient Stated Goal: for vision to get better now that I have had the surgery         AM-PAC OT "6 Clicks" Daily Activity     Outcome Measure Help from another person eating meals?: None Help from another person taking care of personal grooming?: None Help from another person toileting, which includes using toliet, bedpan, or urinal?: None Help from another person bathing (including washing, rinsing, drying)?: None Help from another person to put on and taking off regular upper body clothing?: None Help from another person to put on and taking off regular lower body clothing?: None 6 Click Score: 24   End of Session Equipment Utilized During Treatment: Rolling walker (2 wheels) Nurse Communication: Mobility status  Activity Tolerance: Patient tolerated treatment well Patient left: in chair;with call bell/phone within reach  OT Visit Diagnosis: Other abnormalities of gait and mobility (R26.89);Other (comment) (low vision 1 eye)                 Time: 1610-9604 OT Time Calculation (min): 33 min Charges:  OT General Charges $OT Visit: 1 Visit OT Evaluation $OT Eval Moderate Complexity: 1 Mod OT Treatments $Self Care/Home Management : 8-22 mins Lindon Romp OT Acute Rehabilitation Services Office [phone]    Evette Georges 04/15/2023, 1:48 PM

## 2023-04-15 NOTE — Progress Notes (Signed)
eLink Physician-Brief Progress Note Patient Name: Alexis Kramer DOB: 1940/08/31 MRN: [id number]   Date of Service  04/15/2023  HPI/Events of Note  82 y.o. female who has a PMH as below including recent progressive visual field loss in the ICU for perioperative management of symptomatic pituitary tumor.  Mild hypotension noted this morning.  Maps less than 65.  eICU Interventions  Will obtain cortisol level, start add stress dose steroids.  Transient norepinephrine as needed peripherally     Intervention Category Intermediate Interventions: Hypotension - evaluation and management  Tamarcus Condie 04/15/2023, 2:16 AM

## 2023-04-15 NOTE — Anesthesia Postprocedure Evaluation (Signed)
Anesthesia Post Note  Patient: Alexis Kramer  Procedure(s) Performed: Endoscopic endonasal resection of pituitary tumor TRANSNASAL APPROACH     Patient location during evaluation: PACU Anesthesia Type: General Level of consciousness: awake and alert Pain management: pain level controlled Vital Signs Assessment: post-procedure vital signs reviewed and stable Respiratory status: spontaneous breathing, nonlabored ventilation, respiratory function stable and patient connected to nasal cannula oxygen Cardiovascular status: blood pressure returned to baseline and stable Postop Assessment: no apparent nausea or vomiting Anesthetic complications: no   No notable events documented.  Last Vitals:  Vitals:   04/15/23 1900 04/15/23 2000  BP: (!) 135/48 118/80  Pulse: 67 64  Resp: 17 19  Temp:  36.7 C  SpO2: 95% 96%    Last Pain:  Vitals:   04/15/23 2028  TempSrc:   PainSc: 0-No pain   Pain Goal:                   Kennieth Rad

## 2023-04-16 DIAGNOSIS — E893 Postprocedural hypopituitarism: Secondary | ICD-10-CM | POA: Diagnosis not present

## 2023-04-16 LAB — BASIC METABOLIC PANEL
Anion gap: 9 (ref 5–15)
BUN: 14 mg/dL (ref 8–23)
CO2: 23 mmol/L (ref 22–32)
Calcium: 7.6 mg/dL — ABNORMAL LOW (ref 8.9–10.3)
Chloride: 109 mmol/L (ref 98–111)
Creatinine, Ser: 1.05 mg/dL — ABNORMAL HIGH (ref 0.44–1.00)
GFR, Estimated: 53 mL/min — ABNORMAL LOW (ref >60)
Glucose, Bld: 141 mg/dL — ABNORMAL HIGH (ref 70–99)
Potassium: 4 mmol/L (ref 3.5–5.1)
Sodium: 141 mmol/L (ref 135–145)

## 2023-04-16 LAB — GLUCOSE, CAPILLARY
Glucose-Capillary: 145 mg/dL — ABNORMAL HIGH (ref 70–99)
Glucose-Capillary: 175 mg/dL — ABNORMAL HIGH (ref 70–99)

## 2023-04-16 LAB — PHOSPHORUS: Phosphorus: 3 mg/dL (ref 2.5–4.6)

## 2023-04-16 LAB — MAGNESIUM: Magnesium: 2 mg/dL (ref 1.7–2.4)

## 2023-04-16 LAB — SURGICAL PATHOLOGY

## 2023-04-16 MED ORDER — OXYMETAZOLINE HCL 0.05 % NA SOLN: 1.0000 | Freq: Two times a day (BID) | NASAL | 0 refills | Status: AC | PRN

## 2023-04-16 MED ORDER — ACETAMINOPHEN 325 MG PO TABS: 650.0000 mg | ORAL_TABLET | ORAL | Status: AC | PRN

## 2023-04-16 NOTE — Progress Notes (Signed)
Physical Therapy Treatment Patient Details Name: Alexis Kramer MRN: [id number] DOB: 09-15-1940 Today's Date: 04/16/2023   History of Present Illness [name] is a 82 y.o. female with recent progressive visual field loss noticed by her ophthalmologist. Workup revealed a large non-functioning pituitary adenoma. 04/14/23, she presented to [facility] for elective endonasal endoscopic transphenoidal resection of pituitary tumor and resection of right concha bullosa (Dr. Maurice Small and Dr. Marene Lenz). PHMx: OA both knees, breast CA (L), anxiety, GERD.    PT Comments  Progressing well, discussed calling for assist at home this weekend and decreasing assist as appropriate for next week.  Safe at [facility] to supervised level.     If plan is discharge home, recommend the following: A little help with bathing/dressing/bathroom;Assist for transportation;Assistance with cooking/housework   Can travel by private vehicle        Equipment Recommendations  Rolling walker (2 wheels)    Recommendations for Other Services       Precautions / Restrictions Precautions Precautions:  (lower fall risk)     Mobility  Bed Mobility Overal bed mobility: Modified Independent                  Transfers Overall transfer level: Needs assistance   Transfers: Sit to/from Stand Sit to Stand: Supervision                Ambulation/Gait Ambulation/Gait assistance: Contact guard assist, Supervision Gait Distance (Feet): 200 Feet Assistive device: Straight cane Gait Pattern/deviations: Step-through pattern   Gait velocity interpretation: <1.8 ft/sec, indicate of risk for recurrent falls   General Gait Details: steady, but slower, guarded gait with safe use of the cane   Stairs Stairs: Yes Stairs assistance: Contact guard assist Stair Management: One rail Right, With cane, Step to pattern, Forwards Number of Stairs: 3 General stair comments: safe supervised or assisted   Wheelchair  Mobility     Tilt Bed    Modified Rankin (Stroke Patients Only)       Balance Overall balance assessment: Mild deficits observed, not formally tested                                          Cognition Arousal: Alert Behavior During Therapy: WFL for tasks assessed/performed Overall Cognitive Status: Within Functional Limits for tasks assessed                                          Exercises      General Comments        Pertinent Vitals/Pain Pain Assessment Pain Assessment: No/denies pain    Home Living                          Prior Function            PT Goals (current goals can now be found in the care plan section) Acute Rehab PT Goals PT Goal Formulation: With patient Time For Goal Achievement: 04/29/23 Potential to Achieve Goals: Good Progress towards PT goals: Progressing toward goals    Frequency    Min 1X/week      PT Plan      Co-evaluation              AM-PAC PT "6 Clicks" Mobility  Outcome Measure  Help needed turning from your back to your side while in a flat bed without using bedrails?: A Little Help needed moving from lying on your back to sitting on the side of a flat bed without using bedrails?: A Little Help needed moving to and from a bed to a chair (including a wheelchair)?: A Little Help needed standing up from a chair using your arms (e.g., wheelchair or bedside chair)?: A Little Help needed to walk in hospital room?: A Little Help needed climbing 3-5 steps with a railing? : A Little 6 Click Score: 18    End of Session   Activity Tolerance: Patient tolerated treatment well Patient left: in chair;with call bell/phone within reach Nurse Communication: Mobility status PT Visit Diagnosis: Unsteadiness on feet (R26.81);Other abnormalities of gait and mobility (R26.89)     Time: 1308-6578 PT Time Calculation (min) (ACUTE ONLY): 22 min  Charges:    $Gait Training:  8-22 mins PT General Charges $$ ACUTE PT VISIT: 1 Visit                     04/16/2023  Jacinto Halim., PT Acute Rehabilitation Services [phone]  (office)   Alexis Kramer 04/16/2023, 6:26 PM

## 2023-04-16 NOTE — Progress Notes (Signed)
  NEUROSURGERY PROGRESS NOTE   No issues overnight. Pt without complaint this am. Minimal HA, no visual changes.  EXAM:  BP (!) 88/57   Pulse (!) 58   Temp 97.8 F (36.6 C) (Oral)   Resp 14   Ht 5' (1.524 m)   Wt 59 kg   SpO2 93%   BMI 25.39 kg/m   Awake, alert, oriented  Speech fluent, appropriate  CN grossly intact  5/5 BUE/BLE  No nasal drainage  IMPRESSION:  82 y.o. female POD# 2 s/p transsphenoidal resection of tumor. Mild hypotension currently on Midodrine oral. No s/sx of hypopituitarism / DI.  PLAN: - Will d/c midodrine now - d/c exogenous cortisol - Cont to mobilize - If stable, can likely d/c home later this pm vs transfer to stepdown.   Lisbeth Renshaw, MD [facility]

## 2023-04-16 NOTE — TOC Transition Note (Signed)
Transition of Care [facility]) - CM/SW Discharge Note   Patient Details  Name: Alexis Kramer MRN: [id number] Date of Birth: 1940/07/10  Transition of [facility]) CM/SW Contact:  Glennon Mac, RN Phone Number: 04/16/2023, 4:47 PM   Clinical Narrative:    Alexis Kramer is a 82 y.o. female with recent progressive visual field loss noticed by her ophthalmologist. Workup revealed a large non-functioning pituitary adenoma. 04/14/23, she presented to [facility] for elective endonasal endoscopic transphenoidal resection of pituitary tumor and resection of right concha bullosa (Dr. Maurice Small and Dr. Marene Lenz). PTA, pt independent and lives alone; she states that her godchildren will provide needed assistance at dc. PT/OT recommending OP therapies; referrals made to [facility] OP Neuro Rehab for follow up.    Final next level of care: OP Rehab Barriers to Discharge: Barriers Resolved                       Discharge Plan and Services Additional resources added to the After Visit Summary for     Discharge Planning Services: CM Consult                                 Social Determinants of Health (SDOH) Interventions SDOH Screenings   Food Insecurity: Low Risk  (03/10/2023)   Received from [facility]  Transportation Needs: No Transportation Needs (03/10/2023)   Received from [facility]  Utilities: Low Risk  (03/10/2023)   Received from [facility]  Tobacco Use: Low Risk  (04/06/2023)  Recent Concern: Tobacco Use - Medium Risk (03/10/2023)   Received from [facility]     Readmission Risk Interventions     No data to display          Quintella Baton, RN, BSN  Trauma/Neuro ICU Case Manager [phone]

## 2023-04-16 NOTE — Progress Notes (Addendum)
Discussed with PA and agree with plan as written.    NAME:  Alexis Kramer, MRN:  [id number], DOB:  February 20, 1941, LOS: 2 ADMISSION DATE:  04/14/2023, CONSULTATION DATE:  04/14/23 REFERRING MD:  Maurice Small CHIEF COMPLAINT:  Post Op Management   History of Present Illness:  Alexis Kramer is a 82 y.o. female who has a PMH as below including recent progressive visual field loss noticed by her ophthalmologist. Workup revealed a large non-functioning pituitary adenoma. She was referred to Neurosurgery for surgical consultation.  On 04/14/23, she presented to [facility] for elective endonasal endoscopic transphenoidal resection of pituitary tumor and resection of right concha bullosa (Dr. Maurice Small and Dr. Marene Lenz)  Post operatively, she was admitted to the neuro ICU for close observation. PCCM consulted for medical management.  Pertinent  Medical History:  has Primary osteoarthritis of right knee; Status post transsphenoidal pituitary resection (HCC); and Pituitary adenoma (HCC) on their problem list.  Significant Hospital Events: Including procedures, antibiotic start and stop dates in addition to other pertinent events   11/6 admit for OR 11/6 overnight hypotension, started on steroids and low dose levophed 11/8 off levo yesterday. BP stable on midodrine.   Interim History / Subjective:  Feels good this morning. No headache, changes to vision, nausea. Cortisol yesterday >100. TSH looks okay. Started on hydrocortisone yesterday.   Objective:  Blood pressure 113/61, pulse 60, temperature 98.1 F (36.7 C), temperature source Oral, resp. rate 11, height 5' (1.524 m), weight 59 kg, SpO2 94%.        Intake/Output Summary (Last 24 hours) at 04/16/2023 0658 Last data filed at 04/15/2023 1600 Gross per 24 hour  Intake 278.73 ml  Output 700 ml  Net -421.27 ml   Filed Weights   04/14/23 0714  Weight: 59 kg    Examination: General: older female, laying in bed in no acute distress Neuro: A&O x 3,  non focal neurological exam  HEENT: Anicteric sclera. EOMI. Dried blood about the nares. Postoperative dressing off.  Cardiovascular: s1/s2 without murmur, rub,gallop Lungs: Respirations even and unlabored.  Clear bilaterally  Abdomen: soft, non distended, non tender  Musculoskeletal:  no edema.  Skin: Intact, warm, no rashes. GU: defer   Assessment & Plan:  Non-functioning pituitary adenoma s/p endonasal endoscopic transphenoidal resection of pituitary tumor and section of right concha bullosa ; surgery 11/6. Surgical path still pending - post-op care per NSGY - pain control with PRN oral and IV opiates - cortisol level was >100, ?postoperative stress response, although appears slightly higher than expected. Appreciate NSGY rec for ongoing steroid needs  Hypotension post resection; not totally unexpected, likely component adrenal insufficiency. Cortisol >100, TSH 1.275, free T4 0.59, slightly hypothyroid. Glucoses stable (slightly elevated but on steroids).  - off levo 11/7, BP stable. A few soft pressures but not requiring pressors  - con't solu-cortef 100mg  q8h. Appreciate neuro input on long term dosing.  - con't midodrine 10mg  TID   Insomnia - trazadone qHS  HLD - con't pravastatin 80mg  daily   Stable from medical standpoint. Pending clearance from NSGY. Will continue to follow until out of ICU  Best practice (evaluated daily):  Diet/type: Regular consistency (see orders) DVT prophylaxis: prophylactic heparin  GI prophylaxis: N/A Lines: N/A Foley:  N/A Code Status:  full code Last date of multidisciplinary goals of care discussion: None yet.  Mertha Baars, PA-C [facility] 04/16/2023, 7:04 AM  Please see [email or web address] for pager details.  From 7A-7P if no response, please call [phone].  After hours, please call ELink [phone].

## 2023-04-27 ENCOUNTER — Ambulatory Visit: Payer: Medicare Other | Attending: Neurosurgery | Admitting: Physical Therapy

## 2023-04-27 ENCOUNTER — Ambulatory Visit: Payer: Medicare Other | Admitting: Occupational Therapy

## 2023-04-27 ENCOUNTER — Encounter: Payer: Self-pay | Admitting: Physical Therapy

## 2023-04-27 DIAGNOSIS — M6281 Muscle weakness (generalized): Secondary | ICD-10-CM | POA: Insufficient documentation

## 2023-04-27 DIAGNOSIS — R262 Difficulty in walking, not elsewhere classified: Secondary | ICD-10-CM | POA: Diagnosis not present

## 2023-04-27 DIAGNOSIS — R2681 Unsteadiness on feet: Secondary | ICD-10-CM | POA: Diagnosis not present

## 2023-04-27 DIAGNOSIS — E893 Postprocedural hypopituitarism: Secondary | ICD-10-CM | POA: Diagnosis not present

## 2023-04-27 NOTE — Therapy (Unsigned)
OUTPATIENT PHYSICAL THERAPY NEURO EVALUATION   Patient Name: Alexis Kramer MRN: [id number] DOB:[birth year], 82 y.o., female Today's Date: 04/28/2023   PCP: Laurann Montana, MD REFERRING PROVIDER: Lisbeth Renshaw, MD  END OF SESSION:  PT End of Session - 04/28/23 1125     Visit Number 1    Number of Visits 5    Date for PT Re-Evaluation 06/04/23    Authorization Type UHC Medicare    Authorization Time Period 04-27-23 - 06-08-23    PT Start Time 1450    PT Stop Time 1530    PT Time Calculation (min) 40 min    Activity Tolerance Patient tolerated treatment well    Behavior During Therapy [facility] for tasks assessed/performed             Past Medical History:  Diagnosis Date   Anxiety    Arthritis    both knees   Breast cancer (HCC)    Cancer (HCC) 1994   breast cancer ; left side   Depression    situational   Dyspnea    GERD (gastroesophageal reflux disease)    Past Surgical History:  Procedure Laterality Date   ABDOMINAL HYSTERECTOMY     APPENDECTOMY     CRANIOTOMY N/A 04/14/2023   Procedure: Endoscopic endonasal resection of pituitary tumor;  Surgeon: Jadene Pierini, MD;  Location: [facility] OR;  Service: Neurosurgery;  Laterality: N/A;   EYE SURGERY Bilateral 2021   MASTECTOMY Left 1997   TONSILLECTOMY     TOTAL KNEE ARTHROPLASTY Right 09/12/2019   Procedure: RIGHT TOTAL KNEE ARTHROPLASTY;  Surgeon: Marcene Corning, MD;  Location: [facility] ORS;  Service: Orthopedics;  Laterality: Right;   TRANSNASAL APPROACH N/A 04/14/2023   Procedure: TRANSNASAL APPROACH;  Surgeon: Laren Boom, DO;  Location: [facility] OR;  Service: ENT;  Laterality: N/A;   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   Pituitary adenoma (HCC) 04/14/2023   Primary osteoarthritis of right knee 09/12/2019    ONSET DATE: 04-14-23  REFERRING DIAG:  Diagnosis  E89.3 (ICD-10-CM) - Status post transsphenoidal pituitary  resection (HCC)    THERAPY DIAG:  Difficulty in walking, not elsewhere classified  Muscle weakness (generalized)  Unsteadiness on feet  Rationale for Evaluation and Treatment: Rehabilitation  SUBJECTIVE:                                                                                                                                                                                             SUBJECTIVE STATEMENT: Pt presents to PT eval ambulating with use of SPC, which she states  she used prior to surgery on 04-14-23 for pituatary tumor removal, due to Lt knee pain with need for Lt TKA. Pt reports no significant changes in functional status since surgery on 04-14-23 but states she would like to work on improving her balance and strength in her legs. Pt accompanied by: self  PERTINENT HISTORY: Status post transsphenoidal pituitary resection (HCC) Active Problems:   Pituitary adenoma (HCC), primary OA of Lt knee (with need for TKA per pt report)  PAIN:  Are you having pain? No  PRECAUTIONS: Fall  RED FLAGS: None   WEIGHT BEARING RESTRICTIONS: No  FALLS: Has patient fallen in last 6 months? No  LIVING ENVIRONMENT: Lives with: lives alone Lives in: House/apartment Stairs: Yes: External: 5 steps; on right going up Has following equipment at home: Single point cane and Walker - 2 wheeled  PLOF: Independent with basic ADLs, Independent with household mobility without device, Independent with community mobility with device, Independent with homemaking with ambulation, and Independent with transfers  PATIENT GOALS: improve strength and balance  OBJECTIVE:  Note: Objective measures were completed at Evaluation unless otherwise noted.  DIAGNOSTIC FINDINGS: N/A  COGNITION: Overall cognitive status: Within functional limits for tasks assessed   SENSATION: WFL  COORDINATION: WFL's bil. LE's   POSTURE: No Significant postural limitations  LOWER EXTREMITY ROM:   WFL's bil.  LE's   LOWER EXTREMITY MMT:    MMT Right Eval Left Eval  Hip flexion 5 4  Hip extension    Hip abduction    Hip adduction    Hip internal rotation    Hip external rotation    Knee flexion 5 4  Knee extension 5 4  Ankle dorsiflexion 5 5  Ankle plantarflexion    Ankle inversion    Ankle eversion    (Blank rows = not tested)  BED MOBILITY:  Independent  TRANSFERS: Assistive device utilized: None  Sit to stand: Modified independence  STAIRS:  TBA  GAIT: Gait pattern: antalgic Distance walked: approx. 54' Assistive device utilized: Single point cane and without use of SPC for TUG assessment Level of assistance: Modified independence Comments: increased antalgic gait pattern without use of SPC  FUNCTIONAL TESTS:  5 times sit to stand: 21.03 secs from chair - without UE support Timed up and go (TUG): 21.10 secs without use of SPC 10 meter walk test: 19.37 secs with use of SPC = 1.69 ft/sec  Berg score 44/56   04/27/23 0001  Berg Balance Test  Sit to Stand 4  Standing Unsupported 4  Sitting with Back Unsupported but Feet Supported on Floor or Stool 4  Stand to Sit 3  Transfers 4  Standing Unsupported with Eyes Closed 3  Standing Unsupported with Feet Together 3  From Standing, Reach Forward with Outstretched Arm 4  From Standing Position, Pick up Object from Floor 4  From Standing Position, Turn to Look Behind Over each Shoulder 4  Turn 360 Degrees 2 (r - 7.41   l= 5.22)  Standing Unsupported, Alternately Place Feet on Step/Stool 1  Standing Unsupported, One Foot in Front 3  Standing on One Leg 1  Total Score 44     PATIENT SURVEYS:  N/A - dx is s/p tumor removal  TODAY'S TREATMENT:  DATE: 04-27-23 Eval only    PATIENT EDUCATION: Education details: eval results with POC - discussed OT eval rescheduling as OT out of office today as  initial OT eval was scheduled today:  discussed option for aquatic PT due to c/o Lt knee pain - pt declines this service at this time Person educated: Patient Education method: Explanation Education comprehension: verbalized understanding  HOME EXERCISE PROGRAM: To be established  GOALS: Goals reviewed with patient? Yes  SHORT TERM GOALS: same as LTG's as ELOS is 4 weeks   LONG TERM GOALS: Target date: 06-04-23 (pushed out 1 week due to holiday week)  Independent in HEP for balance and LE strengthening exercises. Baseline:  Goal status: INITIAL  2.  Improve TUG score to </= 17 secs without use of SPC to demo reduced fall risk. Baseline: 21.10 secs without use of SPC Goal status: INITIAL  3.  Increase gait velocity to >/= 2.0 ft/sec with use of SPC for increased gait efficiency. Baseline: 1.69 ft/sec with use of SPC Goal status: INITIAL  4.  Improve Berg balance test score to >/= 48/56 to reduce fall risk. Baseline: 44/56 Goal status: INITIAL  5.  Improve 5x sit to stand score to  Baseline: 21.03 secs without UE support Goal status: INITIAL   ASSESSMENT:  CLINICAL IMPRESSION: Patient is a 82 y.o. lady who was seen today for physical therapy evaluation and treatment for s/p pituitary tumor removal on 04-14-23 with residual balance impairments.  Pt has antalgic gait pattern due to Lt knee OA with need for Lt TKA per her report.  Pt is ambulating with use of SPC primarily due to LLE weakness and balance deficits due to Lt knee OA.  Pt is at mild fall risk per Sharlene Motts balance test score of 44/56 and TUG score of 21.10 secs without use of SPC.  Pt will benefit from PT to address gait and balance deficits and LE weakness.   OBJECTIVE IMPAIRMENTS: decreased balance, difficulty walking, and decreased strength.   ACTIVITY LIMITATIONS: bending, standing, stairs, and locomotion level  PARTICIPATION LIMITATIONS: driving, shopping, and community activity  PERSONAL FACTORS: Age, Fitness,  and 1 comorbidity: Lt knee OA with need for Lt TKA  are also affecting patient's functional outcome.   REHAB POTENTIAL: Good  CLINICAL DECISION MAKING: Stable/uncomplicated  EVALUATION COMPLEXITY: Low  PLAN:  PT FREQUENCY: 1x/week  PT DURATION: 4 weeks + eval  PLANNED INTERVENTIONS: 97110-Therapeutic exercises, 97530- Therapeutic activity, [id number]- Neuromuscular re-education, [phone]- Self Care, 19147- Gait training, and Patient/Family education  PLAN FOR NEXT SESSION: issue HEP for balance & strengthening   Kimball Manske, Donavan Burnet, PT 04/28/2023, 11:28 AM

## 2023-04-28 NOTE — Progress Notes (Signed)
   04/27/23 0001  Berg Balance Test  Sit to Stand 4  Standing Unsupported 4  Sitting with Back Unsupported but Feet Supported on Floor or Stool 4  Stand to Sit 3  Transfers 4  Standing Unsupported with Eyes Closed 3  Standing Unsupported with Feet Together 3  From Standing, Reach Forward with Outstretched Arm 4  From Standing Position, Pick up Object from Floor 4  From Standing Position, Turn to Look Behind Over each Shoulder 4  Turn 360 Degrees 2 (r - 7.41   l= 5.22)  Standing Unsupported, Alternately Place Feet on Step/Stool 1  Standing Unsupported, One Foot in Front 3  Standing on One Leg 1  Total Score 44

## 2023-04-30 DIAGNOSIS — Z9889 Other specified postprocedural states: Secondary | ICD-10-CM | POA: Diagnosis not present

## 2023-04-30 DIAGNOSIS — D497 Neoplasm of unspecified behavior of endocrine glands and other parts of nervous system: Secondary | ICD-10-CM | POA: Diagnosis not present

## 2023-05-13 ENCOUNTER — Encounter: Payer: Medicare Other | Admitting: Occupational Therapy

## 2023-05-13 ENCOUNTER — Ambulatory Visit: Payer: Medicare Other | Attending: Neurosurgery | Admitting: Physical Therapy

## 2023-05-13 DIAGNOSIS — R262 Difficulty in walking, not elsewhere classified: Secondary | ICD-10-CM | POA: Insufficient documentation

## 2023-05-13 DIAGNOSIS — R2681 Unsteadiness on feet: Secondary | ICD-10-CM | POA: Insufficient documentation

## 2023-05-13 DIAGNOSIS — M6281 Muscle weakness (generalized): Secondary | ICD-10-CM | POA: Insufficient documentation

## 2023-05-13 NOTE — Therapy (Signed)
OUTPATIENT PHYSICAL THERAPY NEURO EVALUATION   Patient Name: Alexis Kramer MRN: [id number] DOB:11-23-1940, 82 y.o., female Today's Date: 05/14/2023   PCP: Laurann Montana, MD REFERRING PROVIDER: Lisbeth Renshaw, MD  END OF SESSION:  PT End of Session - 05/14/23 1809     Visit Number 2    Number of Visits 5    Date for PT Re-Evaluation 06/04/23    Authorization Type UHC Medicare    Authorization Time Period 04-27-23 - 06-08-23    PT Start Time 1447    PT Stop Time 1536    PT Time Calculation (min) 49 min    Activity Tolerance Patient tolerated treatment well    Behavior During Therapy [facility] for tasks assessed/performed              Past Medical History:  Diagnosis Date   Anxiety    Arthritis    both knees   Breast cancer (HCC)    Cancer (HCC) 1994   breast cancer ; left side   Depression    situational   Dyspnea    GERD (gastroesophageal reflux disease)    Past Surgical History:  Procedure Laterality Date   ABDOMINAL HYSTERECTOMY     APPENDECTOMY     CRANIOTOMY N/A 04/14/2023   Procedure: Endoscopic endonasal resection of pituitary tumor;  Surgeon: Jadene Pierini, MD;  Location: [facility] OR;  Service: Neurosurgery;  Laterality: N/A;   EYE SURGERY Bilateral 2021   MASTECTOMY Left 1997   TONSILLECTOMY     TOTAL KNEE ARTHROPLASTY Right 09/12/2019   Procedure: RIGHT TOTAL KNEE ARTHROPLASTY;  Surgeon: Marcene Corning, MD;  Location: [facility] ORS;  Service: Orthopedics;  Laterality: Right;   TRANSNASAL APPROACH N/A 04/14/2023   Procedure: TRANSNASAL APPROACH;  Surgeon: Laren Boom, DO;  Location: [facility] OR;  Service: ENT;  Laterality: N/A;   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   Pituitary adenoma (HCC) 04/14/2023   Primary osteoarthritis of right knee 09/12/2019    ONSET DATE: 04-14-23  REFERRING DIAG:  Diagnosis  E89.3 (ICD-10-CM) - Status post transsphenoidal pituitary  resection (HCC)    THERAPY DIAG:  Muscle weakness (generalized)  Unsteadiness on feet  Rationale for Evaluation and Treatment: Rehabilitation  SUBJECTIVE:                                                                                                                                                                                             SUBJECTIVE STATEMENT: Pt reports no changes since eval - does report some increased discomfort in Lt knee due to the cold  weather Pt accompanied by: self  PERTINENT HISTORY: Status post transsphenoidal pituitary resection (HCC) Active Problems:   Pituitary adenoma (HCC), primary OA of Lt knee (with need for TKA per pt report)  PAIN:  Are you having pain? Yes - Left knee - 8/10 - maybe due to the cold weather  PRECAUTIONS: Fall  RED FLAGS: None   WEIGHT BEARING RESTRICTIONS: No  FALLS: Has patient fallen in last 6 months? No  LIVING ENVIRONMENT: Lives with: lives alone Lives in: House/apartment Stairs: Yes: External: 5 steps; on right going up Has following equipment at home: Single point cane and Walker - 2 wheeled  PLOF: Independent with basic ADLs, Independent with household mobility without device, Independent with community mobility with device, Independent with homemaking with ambulation, and Independent with transfers  PATIENT GOALS: improve strength and balance  OBJECTIVE:  Note: Objective measures were completed at Evaluation unless otherwise noted.  DIAGNOSTIC FINDINGS: N/A  COGNITION: Overall cognitive status: Within functional limits for tasks assessed   SENSATION: WFL  COORDINATION: WFL's bil. LE's   POSTURE: No Significant postural limitations  LOWER EXTREMITY ROM:   WFL's bil. LE's   LOWER EXTREMITY MMT:    MMT Right Eval Left Eval  Hip flexion 5 4  Hip extension    Hip abduction    Hip adduction    Hip internal rotation    Hip external rotation    Knee flexion 5 4  Knee extension 5 4  Ankle  dorsiflexion 5 5  Ankle plantarflexion    Ankle inversion    Ankle eversion    (Blank rows = not tested)  BED MOBILITY:  Independent  TRANSFERS: Assistive device utilized: None  Sit to stand: Modified independence  STAIRS:  TBA  GAIT: Gait pattern: antalgic Distance walked: approx. 36' Assistive device utilized: Single point cane and without use of SPC for TUG assessment Level of assistance: Modified independence Comments: increased antalgic gait pattern without use of SPC  FUNCTIONAL TESTS:  5 times sit to stand: 21.03 secs from chair - without UE support Timed up and go (TUG): 21.10 secs without use of SPC 10 meter walk test: 19.37 secs with use of SPC = 1.69 ft/sec  Berg score 44/56   04/27/23 0001  Berg Balance Test  Sit to Stand 4  Standing Unsupported 4  Sitting with Back Unsupported but Feet Supported on Floor or Stool 4  Stand to Sit 3  Transfers 4  Standing Unsupported with Eyes Closed 3  Standing Unsupported with Feet Together 3  From Standing, Reach Forward with Outstretched Arm 4  From Standing Position, Pick up Object from Floor 4  From Standing Position, Turn to Look Behind Over each Shoulder 4  Turn 360 Degrees 2 (r - 7.41   l= 5.22)  Standing Unsupported, Alternately Place Feet on Step/Stool 1  Standing Unsupported, One Foot in Front 3  Standing on One Leg 1  Total Score 44     PATIENT SURVEYS:  N/A - dx is s/p tumor removal  TODAY'S TREATMENT:  05-13-23  TherEx: Pt performed following exercises for HEP  Access Code: [email or web address] URL: [email or web address] Date: 05/14/2023 Prepared by: Maebelle Munroe  Exercises - Seated Hamstring Stretch with Chair  - 1 x daily - 7 x weekly - 1 sets - 1-2 reps - 15-20 sec hold - Standing Gastroc Stretch  - 1 x daily - 7 x weekly - 1 sets - 1-2 reps - 15-20 sec hold - Standing  March with Counter Support  - 1 x daily - 7 x weekly - 1 sets - 10 reps - Standing Hip Flexion with Counter Support  - 1 x daily - 7 x weekly - 1 sets - 10 reps - Standing Hip Abduction with Counter Support  - 1 x daily - 7 x weekly - 3 sets - 10 reps - Standing Hip Extension with Counter Support  - 1 x daily - 7 x weekly - 1 sets - 10 reps - Sit to Stand  - 1 x daily - 7 x weekly - 1 sets - 10 reps - Single Leg Stance with Support  - 1 x daily - 7 x weekly - 1 sets - 2 reps - 10 sec hold - Seated Hamstring Curl with Anchored Resistance  - 1 x daily - 7 x weekly - 1 sets - 10 reps  NeuroRe-ed:  Pt performed standing balance exercises for HEP - see above - UE support needed for safety   PATIENT EDUCATION: Education details: HEP - Medbridge [id number] Person educated: Patient Education method: Explanation Education comprehension: verbalized understanding  HOME EXERCISE PROGRAM: To be established  GOALS: Goals reviewed with patient? Yes  SHORT TERM GOALS: same as LTG's as ELOS is 4 weeks   LONG TERM GOALS: Target date: 06-04-23 (pushed out 1 week due to holiday week)  Independent in HEP for balance and LE strengthening exercises. Baseline:  Goal status: INITIAL  2.  Improve TUG score to </= 17 secs without use of SPC to demo reduced fall risk. Baseline: 21.10 secs without use of SPC Goal status: INITIAL  3.  Increase gait velocity to >/= 2.0 ft/sec with use of SPC for increased gait efficiency. Baseline: 1.69 ft/sec with use of SPC Goal status: INITIAL  4.  Improve Berg balance test score to >/= 48/56 to reduce fall risk. Baseline: 44/56 Goal status: INITIAL  5.  Improve 5x sit to stand score to  Baseline: 21.03 secs without UE support Goal status: INITIAL   ASSESSMENT:  CLINICAL IMPRESSION: PT session focused on establishing HEP for LE strengthening and standing balance, with focus on improving SLS on each leg.  Pt's Lt knee pain due to OA (with need for TKA)  significantly limits/prevents LLE SLS without UE support.  Pt tolerated exercises well.  Cont with POC.  OBJECTIVE IMPAIRMENTS: decreased balance, difficulty walking, and decreased strength.   ACTIVITY LIMITATIONS: bending, standing, stairs, and locomotion level  PARTICIPATION LIMITATIONS: driving, shopping, and community activity  PERSONAL FACTORS: Age, Fitness, and 1 comorbidity: Lt knee OA with need for Lt TKA  are also affecting patient's functional outcome.   REHAB POTENTIAL: Good  CLINICAL DECISION MAKING: Stable/uncomplicated  EVALUATION COMPLEXITY: Low  PLAN:  PT FREQUENCY: 1x/week  PT DURATION: 4 weeks + eval  PLANNED INTERVENTIONS: 97110-Therapeutic exercises, 97530- Therapeutic activity, [id number]- Neuromuscular re-education, 97535- Self Care, 54098- Gait training, and Patient/Family education  PLAN FOR NEXT SESSION: check HEP for ?'s; cont. LE strengthening and balance training   Julie Nay, Donavan Burnet, PT 05/14/2023, 6:11 PM

## 2023-05-14 ENCOUNTER — Encounter: Payer: Self-pay | Admitting: Physical Therapy

## 2023-05-20 ENCOUNTER — Ambulatory Visit: Payer: Medicare Other | Admitting: Physical Therapy

## 2023-05-20 ENCOUNTER — Encounter: Payer: Medicare Other | Admitting: Occupational Therapy

## 2023-05-20 DIAGNOSIS — R2681 Unsteadiness on feet: Secondary | ICD-10-CM | POA: Diagnosis not present

## 2023-05-20 DIAGNOSIS — M6281 Muscle weakness (generalized): Secondary | ICD-10-CM

## 2023-05-20 DIAGNOSIS — R262 Difficulty in walking, not elsewhere classified: Secondary | ICD-10-CM | POA: Diagnosis not present

## 2023-05-20 NOTE — Therapy (Signed)
OUTPATIENT PHYSICAL THERAPY NEURO EVALUATION   Patient Name: Alexis Kramer MRN: [id number] DOB:[birth year], 82 y.o., female Today's Date: 05/21/2023   PCP: Laurann Montana, MD REFERRING PROVIDER: Lisbeth Renshaw, MD  END OF SESSION:  PT End of Session - 05/21/23 1818     Visit Number 3    Number of Visits 5    Date for PT Re-Evaluation 06/04/23    Authorization Type [employer] Medicare    Authorization Time Period 04-27-23 - 06-08-23    PT Start Time 1535    PT Stop Time 1618    PT Time Calculation (min) 43 min    Activity Tolerance Patient tolerated treatment well    Behavior During Therapy [facility] for tasks assessed/performed               Past Medical History:  Diagnosis Date   Anxiety    Arthritis    both knees   Breast cancer (HCC)    Cancer (HCC) 1994   breast cancer ; left side   Depression    situational   Dyspnea    GERD (gastroesophageal reflux disease)    Past Surgical History:  Procedure Laterality Date   ABDOMINAL HYSTERECTOMY     APPENDECTOMY     CRANIOTOMY N/A 04/14/2023   Procedure: Endoscopic endonasal resection of pituitary tumor;  Surgeon: Jadene Pierini, MD;  Location: [facility] OR;  Service: Neurosurgery;  Laterality: N/A;   EYE SURGERY Bilateral 2021   MASTECTOMY Left 1997   TONSILLECTOMY     TOTAL KNEE ARTHROPLASTY Right 09/12/2019   Procedure: RIGHT TOTAL KNEE ARTHROPLASTY;  Surgeon: Marcene Corning, MD;  Location: [facility] ORS;  Service: Orthopedics;  Laterality: Right;   TRANSNASAL APPROACH N/A 04/14/2023   Procedure: TRANSNASAL APPROACH;  Surgeon: Laren Boom, DO;  Location: [facility] OR;  Service: ENT;  Laterality: N/A;   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   Pituitary adenoma (HCC) 04/14/2023   Primary osteoarthritis of right knee 09/12/2019    ONSET DATE: 04-14-23  REFERRING DIAG:  Diagnosis  E89.3 (ICD-10-CM) - Status post transsphenoidal pituitary  resection (HCC)    THERAPY DIAG:  Muscle weakness (generalized)  Rationale for Evaluation and Treatment: Rehabilitation  SUBJECTIVE:                                                                                                                                                                                             SUBJECTIVE STATEMENT: Pt reports she almost fell the other day due to her left knee giving way - states she was walking in her  home without her cane and her Lt knee buckled - did not fall, was able to catch herself Pt accompanied by: self  PERTINENT HISTORY: Status post transsphenoidal pituitary resection [facility]) Active Problems:   Pituitary adenoma (HCC), primary OA of Lt knee (with need for TKA per pt report)  PAIN:  Are you having pain? Yes - Left knee - 8/10 - chronic pain due to OA (need for TKA);  pt reports cold weather and weight bearing aggravate the pain;  non-weight bearing positions alleviate the pain  PRECAUTIONS: Fall  RED FLAGS: None   WEIGHT BEARING RESTRICTIONS: No  FALLS: Has patient fallen in last 6 months? No  LIVING ENVIRONMENT: Lives with: lives alone Lives in: House/apartment Stairs: Yes: External: 5 steps; on right going up Has following equipment at home: Single point cane and Walker - 2 wheeled  PLOF: Independent with basic ADLs, Independent with household mobility without device, Independent with community mobility with device, Independent with homemaking with ambulation, and Independent with transfers  PATIENT GOALS: improve strength and balance  OBJECTIVE:  Note: Objective measures were completed at Evaluation unless otherwise noted.  DIAGNOSTIC FINDINGS: N/A  COGNITION: Overall cognitive status: Within functional limits for tasks assessed   SENSATION: WFL  COORDINATION: WFL's bil. LE's   POSTURE: No Significant postural limitations  LOWER EXTREMITY ROM:   WFL's bil. LE's   LOWER EXTREMITY MMT:    MMT Right Eval  Left Eval  Hip flexion 5 4  Hip extension    Hip abduction    Hip adduction    Hip internal rotation    Hip external rotation    Knee flexion 5 4  Knee extension 5 4  Ankle dorsiflexion 5 5  Ankle plantarflexion    Ankle inversion    Ankle eversion    (Blank rows = not tested)  BED MOBILITY:  Independent  TRANSFERS: Assistive device utilized: None  Sit to stand: Modified independence  STAIRS:  TBA  GAIT: Gait pattern: antalgic Distance walked: approx. 37' Assistive device utilized: Single point cane and without use of SPC for TUG assessment Level of assistance: Modified independence Comments: increased antalgic gait pattern without use of SPC  FUNCTIONAL TESTS:  5 times sit to stand: 21.03 secs from chair - without UE support Timed up and go (TUG): 21.10 secs without use of SPC 10 meter walk test: 19.37 secs with use of SPC = 1.69 ft/sec  Berg score 44/56   04/27/23 0001  Berg Balance Test  Sit to Stand 4  Standing Unsupported 4  Sitting with Back Unsupported but Feet Supported on Floor or Stool 4  Stand to Sit 3  Transfers 4  Standing Unsupported with Eyes Closed 3  Standing Unsupported with Feet Together 3  From Standing, Reach Forward with Outstretched Arm 4  From Standing Position, Pick up Object from Floor 4  From Standing Position, Turn to Look Behind Over each Shoulder 4  Turn 360 Degrees 2 (r - 7.41   l= 5.22)  Standing Unsupported, Alternately Place Feet on Step/Stool 1  Standing Unsupported, One Foot in Front 3  Standing on One Leg 1  Total Score 44     PATIENT SURVEYS:  N/A - dx is s/p tumor removal  TODAY'S TREATMENT:  05-20-23  TherEx:  Exercises for LE strengthening in seated and supine positions  Green theraband used for bil. LAQ's - 10 reps each; green theraband used for bil. Hamstring strengthening 10 reps each  leg  Updated/revised HEP due to c/o Lt knee pain with weight bearing:   Access Code: [email or web address] URL: [email or web address] Date: 05/20/2023 Prepared by: Maebelle Munroe  Exercises - Seated Knee Extension with Resistance  - 1 x daily - 7 x weekly - 1 sets - 10 reps - 4-5 secs  hold - Beginner Bridge  - 1 x daily - 7 x weekly - 1 sets - 10 reps - 3 sec hold - Clam with Resistance  - 1 x daily - 7 x weekly - 1 sets - 10 reps - Sidelying Hip Abduction  - 1 x daily - 7 x weekly - 1 sets - 10 reps - Active Straight Leg Raise with Quad Set  - 1 x daily - 7 x weekly - 1 sets - 10 reps - Supine Hip Flexion  - 1 x daily - 7 x weekly - 1 sets - 10 reps    Pt performed following exercises for HEP  Access Code: [email or web address] URL: [email or web address] Date: 05/14/2023 Prepared by: Maebelle Munroe  Exercises - Seated Hamstring Stretch with Chair  - 1 x daily - 7 x weekly - 1 sets - 1-2 reps - 15-20 sec hold - Standing Gastroc Stretch  - 1 x daily - 7 x weekly - 1 sets - 1-2 reps - 15-20 sec hold - Standing March with Counter Support  - 1 x daily - 7 x weekly - 1 sets - 10 reps - Standing Hip Flexion with Counter Support  - 1 x daily - 7 x weekly - 1 sets - 10 reps - Standing Hip Abduction with Counter Support  - 1 x daily - 7 x weekly - 3 sets - 10 reps - Standing Hip Extension with Counter Support  - 1 x daily - 7 x weekly - 1 sets - 10 reps - Sit to Stand  - 1 x daily - 7 x weekly - 1 sets - 10 reps - Single Leg Stance with Support  - 1 x daily - 7 x weekly - 1 sets - 2 reps - 10 sec hold - Seated Hamstring Curl with Anchored Resistance  - 1 x daily - 7 x weekly - 1 sets - 10 reps  NeuroRe-ed:  Pt performed standing balance exercises for HEP - see above - UE support needed for safety   PATIENT EDUCATION: Education details: HEP - Medbridge [phone] Person educated: Patient Education method: Explanation Education comprehension: verbalized understanding  HOME EXERCISE  PROGRAM: To be established  GOALS: Goals reviewed with patient? Yes  SHORT TERM GOALS: same as LTG's as ELOS is 4 weeks   LONG TERM GOALS: Target date: 06-04-23 (pushed out 1 week due to holiday week)  Independent in HEP for balance and LE strengthening exercises. Baseline:  Goal status: INITIAL  2.  Improve TUG score to </= 17 secs without use of SPC to demo reduced fall risk. Baseline: 21.10 secs without use of SPC Goal status: INITIAL  3.  Increase gait velocity to >/= 2.0 ft/sec with use of SPC for increased gait efficiency. Baseline: 1.69 ft/sec with use of SPC Goal status: INITIAL  4.  Improve Berg balance test score to >/= 48/56 to reduce fall risk. Baseline: 44/56 Goal status: INITIAL  5.  Improve 5x sit to stand score to  Baseline: 21.03  secs without UE support Goal status: INITIAL   ASSESSMENT:  CLINICAL IMPRESSION: PT session focused LE strengthening exercises in nonweightbearing positions due to c/o increased Lt knee pain in today's session.  Pt was advised to perform standing balance exercises on days with reduced pain and to perform exercises issued in today's session on today's in which increased Lt knee pain is experienced.  Pt continues to amb. With use of SPC with antalgic gait pattern.   Cont with POC.  OBJECTIVE IMPAIRMENTS: decreased balance, difficulty walking, and decreased strength.   ACTIVITY LIMITATIONS: bending, standing, stairs, and locomotion level  PARTICIPATION LIMITATIONS: driving, shopping, and community activity  PERSONAL FACTORS: Age, Fitness, and 1 comorbidity: Lt knee OA with need for Lt TKA  are also affecting patient's functional outcome.   REHAB POTENTIAL: Good  CLINICAL DECISION MAKING: Stable/uncomplicated  EVALUATION COMPLEXITY: Low  PLAN:  PT FREQUENCY: 1x/week  PT DURATION: 4 weeks + eval  PLANNED INTERVENTIONS: 97110-Therapeutic exercises, 97530- Therapeutic activity, [id number]- Neuromuscular re-education, 97535- Self  Care, [id number]- Gait training, and Patient/Family education  PLAN FOR NEXT SESSION: check HEP for ?'s; cont. LE strengthening and balance training   Ravin Bendall, Donavan Burnet, PT 05/21/2023, 6:19 PM

## 2023-05-21 ENCOUNTER — Encounter: Payer: Self-pay | Admitting: Physical Therapy

## 2023-05-25 ENCOUNTER — Ambulatory Visit: Payer: Medicare Other | Admitting: Physical Therapy

## 2023-05-25 ENCOUNTER — Encounter: Payer: Medicare Other | Admitting: Occupational Therapy

## 2023-05-25 DIAGNOSIS — R2681 Unsteadiness on feet: Secondary | ICD-10-CM | POA: Diagnosis not present

## 2023-05-25 DIAGNOSIS — M6281 Muscle weakness (generalized): Secondary | ICD-10-CM

## 2023-05-25 DIAGNOSIS — R262 Difficulty in walking, not elsewhere classified: Secondary | ICD-10-CM | POA: Diagnosis not present

## 2023-05-25 NOTE — Therapy (Unsigned)
OUTPATIENT PHYSICAL THERAPY NEURO TREATMENT NOTE   Patient Name: Alexis Kramer MRN: [id number] DOB:August 13, 1940, 82 y.o., female Today's Date: 05/26/2023   PCP: Laurann Montana, MD REFERRING PROVIDER: Lisbeth Renshaw, MD  END OF SESSION:  PT End of Session - 05/26/23 1610     Visit Number 4    Number of Visits 5    Date for PT Re-Evaluation 06/04/23    Authorization Type UHC Medicare    Authorization Time Period 04-27-23 - 06-08-23    PT Start Time 1403    PT Stop Time 1446    PT Time Calculation (min) 43 min    Activity Tolerance Patient tolerated treatment well    Behavior During Therapy [facility] for tasks assessed/performed                Past Medical History:  Diagnosis Date   Anxiety    Arthritis    both knees   Breast cancer (HCC)    Cancer (HCC) 1994   breast cancer ; left side   Depression    situational   Dyspnea    GERD (gastroesophageal reflux disease)    Past Surgical History:  Procedure Laterality Date   ABDOMINAL HYSTERECTOMY     APPENDECTOMY     CRANIOTOMY N/A 04/14/2023   Procedure: Endoscopic endonasal resection of pituitary tumor;  Surgeon: Jadene Pierini, MD;  Location: [facility] OR;  Service: Neurosurgery;  Laterality: N/A;   EYE SURGERY Bilateral 2021   MASTECTOMY Left 1997   TONSILLECTOMY     TOTAL KNEE ARTHROPLASTY Right 09/12/2019   Procedure: RIGHT TOTAL KNEE ARTHROPLASTY;  Surgeon: Marcene Corning, MD;  Location: [facility] ORS;  Service: Orthopedics;  Laterality: Right;   TRANSNASAL APPROACH N/A 04/14/2023   Procedure: TRANSNASAL APPROACH;  Surgeon: Laren Boom, DO;  Location: [facility] OR;  Service: ENT;  Laterality: N/A;   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   Pituitary adenoma (HCC) 04/14/2023   Primary osteoarthritis of right knee 09/12/2019    ONSET DATE: 04-14-23  REFERRING DIAG:  Diagnosis  E89.3 (ICD-10-CM) - Status post transsphenoidal  pituitary resection (HCC)    THERAPY DIAG:  Muscle weakness (generalized)  Difficulty in walking, not elsewhere classified  Rationale for Evaluation and Treatment: Rehabilitation  SUBJECTIVE:                                                                                                                                                                                             SUBJECTIVE STATEMENT: Pt reports she is doing fine - no changes or problems reported Pt  accompanied by: self  PERTINENT HISTORY: Status post transsphenoidal pituitary resection (HCC) Active Problems:   Pituitary adenoma (HCC), primary OA of Lt knee (with need for TKA per pt report)  PAIN:  Are you having pain? Yes - Left knee - 7/10 - chronic pain due to OA (need for TKA);  pt reports cold weather and weight bearing aggravate the pain;  non-weight bearing positions alleviate the pain  PRECAUTIONS: Fall  RED FLAGS: None   WEIGHT BEARING RESTRICTIONS: No  FALLS: Has patient fallen in last 6 months? No  LIVING ENVIRONMENT: Lives with: lives alone Lives in: House/apartment Stairs: Yes: External: 5 steps; on right going up Has following equipment at home: Single point cane and Walker - 2 wheeled  PLOF: Independent with basic ADLs, Independent with household mobility without device, Independent with community mobility with device, Independent with homemaking with ambulation, and Independent with transfers  PATIENT GOALS: improve strength and balance  OBJECTIVE:  Note: Objective measures were completed at Evaluation unless otherwise noted.  DIAGNOSTIC FINDINGS: N/A  COGNITION: Overall cognitive status: Within functional limits for tasks assessed   SENSATION: WFL  COORDINATION: WFL's bil. LE's   POSTURE: No Significant postural limitations  LOWER EXTREMITY ROM:   WFL's bil. LE's   LOWER EXTREMITY MMT:    MMT Right Eval Left Eval  Hip flexion 5 4  Hip extension    Hip abduction     Hip adduction    Hip internal rotation    Hip external rotation    Knee flexion 5 4  Knee extension 5 4  Ankle dorsiflexion 5 5  Ankle plantarflexion    Ankle inversion    Ankle eversion    (Blank rows = not tested)  BED MOBILITY:  Independent  TRANSFERS: Assistive device utilized: None  Sit to stand: Modified independence  STAIRS:  TBA  GAIT: Gait pattern: antalgic Distance walked: approx. 40' Assistive device utilized: Single point cane and without use of SPC for TUG assessment Level of assistance: Modified independence Comments: increased antalgic gait pattern without use of SPC  FUNCTIONAL TESTS:  5 times sit to stand: 21.03 secs from chair - without UE support Timed up and go (TUG): 21.10 secs without use of SPC 10 meter walk test: 19.37 secs with use of SPC = 1.69 ft/sec  Berg score 44/56   04/27/23 0001  Berg Balance Test  Sit to Stand 4  Standing Unsupported 4  Sitting with Back Unsupported but Feet Supported on Floor or Stool 4  Stand to Sit 3  Transfers 4  Standing Unsupported with Eyes Closed 3  Standing Unsupported with Feet Together 3  From Standing, Reach Forward with Outstretched Arm 4  From Standing Position, Pick up Object from Floor 4  From Standing Position, Turn to Look Behind Over each Shoulder 4  Turn 360 Degrees 2 (r - 7.41   l= 5.22)  Standing Unsupported, Alternately Place Feet on Step/Stool 1  Standing Unsupported, One Foot in Front 3  Standing on One Leg 1  Total Score 44     PATIENT SURVEYS:  N/A - dx is s/p tumor removal  TODAY'S TREATMENT:  05-25-23  TherEx:  Exercises for LE strengthening in seated and supine positions  Lt hamstring stretch in seated position - Lt foot on floor 20 sec hold x 1 rep; progressed to placing foot on stool for increased stretch - 20 sec hold x 1 rep LLE LAQ 3# weight 10 reps  - 2-3 sec hold Supine exs:  bridging x 10 reps Bridging with LE extension 5 reps each leg SAQ LLE with 3# weight 10 reps Hip flexion RLE and LLE with 3# weight in hooklying position - 10 reps Hip abduction - RLE and LLE in sidelying positions - 10 reps each - no weight Clam shell exercise - RLE and LLE with 3# weight 10 reps each leg  Step up exercise 10 reps RLE and LLE onto 6" step with bil. Hand rails for assist and for balance SciFit level 2.0 x 5" with bil. Ue's and LE's for strengthening   05-20-23: Green theraband used for bil. LAQ's - 10 reps each; green theraband used for bil. Hamstring strengthening 10 reps each leg  Updated/revised HEP due to c/o Lt knee pain with weight bearing:   Access Code: [email or web address] URL: [email or web address] Date: 05/20/2023 Prepared by: Maebelle Munroe  Exercises - Seated Knee Extension with Resistance  - 1 x daily - 7 x weekly - 1 sets - 10 reps - 4-5 secs  hold - Beginner Bridge  - 1 x daily - 7 x weekly - 1 sets - 10 reps - 3 sec hold - Clam with Resistance  - 1 x daily - 7 x weekly - 1 sets - 10 reps - Sidelying Hip Abduction  - 1 x daily - 7 x weekly - 1 sets - 10 reps - Active Straight Leg Raise with Quad Set  - 1 x daily - 7 x weekly - 1 sets - 10 reps - Supine Hip Flexion  - 1 x daily - 7 x weekly - 1 sets - 10 reps  ----------------------- 3# LAQ Hamstring stretch  SAQ 3# SLR  Pt performed following exercises for HEP  Access Code: [email or web address] URL: [email or web address] Date: 05/14/2023 Prepared by: Maebelle Munroe  Exercises - Seated Hamstring Stretch with Chair  - 1 x daily - 7 x weekly - 1 sets - 1-2 reps - 15-20 sec hold - Standing Gastroc Stretch  - 1 x daily - 7 x weekly - 1 sets - 1-2 reps - 15-20 sec hold - Standing March with Counter Support  - 1 x daily - 7 x weekly - 1 sets - 10 reps - Standing Hip Flexion with Counter Support  - 1 x daily - 7 x weekly - 1 sets - 10 reps - Standing Hip Abduction  with Counter Support  - 1 x daily - 7 x weekly - 3 sets - 10 reps - Standing Hip Extension with Counter Support  - 1 x daily - 7 x weekly - 1 sets - 10 reps - Sit to Stand  - 1 x daily - 7 x weekly - 1 sets - 10 reps - Single Leg Stance with Support  - 1 x daily - 7 x weekly - 1 sets - 2 reps - 10 sec hold - Seated Hamstring Curl with Anchored Resistance  - 1 x daily - 7 x weekly - 1 sets - 10 reps  NeuroRe-ed:  Pt performed standing balance exercises for HEP - see above - UE support needed for safety   PATIENT EDUCATION: Education details: HEP - Medbridge [phone] Person educated: Patient Education  method: Explanation Education comprehension: verbalized understanding  HOME EXERCISE PROGRAM: To be established  GOALS: Goals reviewed with patient? Yes  SHORT TERM GOALS: same as LTG's as ELOS is 4 weeks   LONG TERM GOALS: Target date: 06-04-23 (pushed out 1 week due to holiday week)  Independent in HEP for balance and LE strengthening exercises. Baseline:  Goal status: INITIAL  2.  Improve TUG score to </= 17 secs without use of SPC to demo reduced fall risk. Baseline: 21.10 secs without use of SPC Goal status: INITIAL  3.  Increase gait velocity to >/= 2.0 ft/sec with use of SPC for increased gait efficiency. Baseline: 1.69 ft/sec with use of SPC Goal status: INITIAL  4.  Improve Berg balance test score to >/= 48/56 to reduce fall risk. Baseline: 44/56 Goal status: INITIAL  5.  Improve 5x sit to stand score to  Baseline: 21.03 secs without UE support Goal status: INITIAL   ASSESSMENT:  CLINICAL IMPRESSION: PT session focused on bil. LE strengthening and stretching.  Pt able perform PRE's with 3# weight in today's session.  Cont to have > pain in Lt knee than Rt knee.  Tolerated exercises well.  Cont with POC.  OBJECTIVE IMPAIRMENTS: decreased balance, difficulty walking, and decreased strength.   ACTIVITY LIMITATIONS: bending, standing, stairs, and locomotion  level  PARTICIPATION LIMITATIONS: driving, shopping, and community activity  PERSONAL FACTORS: Age, Fitness, and 1 comorbidity: Lt knee OA with need for Lt TKA  are also affecting patient's functional outcome.   REHAB POTENTIAL: Good  CLINICAL DECISION MAKING: Stable/uncomplicated  EVALUATION COMPLEXITY: Low  PLAN:  PT FREQUENCY: 1x/week  PT DURATION: 4 weeks + eval  PLANNED INTERVENTIONS: 97110-Therapeutic exercises, 97530- Therapeutic activity, [id number]- Neuromuscular re-education, 97535- Self Care, [id number]- Gait training, and Patient/Family education  PLAN FOR NEXT SESSION: cont. LE strengthening and balance training   Shontelle Muska, Donavan Burnet, PT 05/26/2023, 4:12 PM

## 2023-05-26 ENCOUNTER — Encounter: Payer: Self-pay | Admitting: Physical Therapy

## 2023-05-27 DIAGNOSIS — D352 Benign neoplasm of pituitary gland: Secondary | ICD-10-CM | POA: Diagnosis not present

## 2023-06-04 ENCOUNTER — Ambulatory Visit: Payer: Medicare Other | Admitting: Physical Therapy

## 2023-06-04 DIAGNOSIS — M6281 Muscle weakness (generalized): Secondary | ICD-10-CM | POA: Diagnosis not present

## 2023-06-04 DIAGNOSIS — R2681 Unsteadiness on feet: Secondary | ICD-10-CM | POA: Diagnosis not present

## 2023-06-04 DIAGNOSIS — R262 Difficulty in walking, not elsewhere classified: Secondary | ICD-10-CM | POA: Diagnosis not present

## 2023-06-04 NOTE — Therapy (Unsigned)
OUTPATIENT PHYSICAL THERAPY NEURO TREATMENT NOTE   Patient Name: Alexis Kramer MRN: [id number] DOB:11-15-1940, 82 y.o., female Today's Date: 06/05/2023   PCP: Laurann Montana, MD REFERRING PROVIDER: Lisbeth Renshaw, MD  END OF SESSION:  PT End of Session - 06/05/23 1418     Visit Number 5    Number of Visits 5    Date for PT Re-Evaluation 06/04/23    Authorization Type [employer] Medicare    Authorization Time Period 04-27-23 - 06-08-23    PT Start Time 1402    PT Stop Time 1445    PT Time Calculation (min) 43 min    Activity Tolerance Patient tolerated treatment well    Behavior During Therapy [facility] for tasks assessed/performed                 Past Medical History:  Diagnosis Date   Anxiety    Arthritis    both knees   Breast cancer (HCC)    Cancer (HCC) 1994   breast cancer ; left side   Depression    situational   Dyspnea    GERD (gastroesophageal reflux disease)    Past Surgical History:  Procedure Laterality Date   ABDOMINAL HYSTERECTOMY     APPENDECTOMY     CRANIOTOMY N/A 04/14/2023   Procedure: Endoscopic endonasal resection of pituitary tumor;  Surgeon: Jadene Pierini, MD;  Location: [facility] OR;  Service: Neurosurgery;  Laterality: N/A;   EYE SURGERY Bilateral 2021   MASTECTOMY Left 1997   TONSILLECTOMY     TOTAL KNEE ARTHROPLASTY Right 09/12/2019   Procedure: RIGHT TOTAL KNEE ARTHROPLASTY;  Surgeon: Marcene Corning, MD;  Location: [facility] ORS;  Service: Orthopedics;  Laterality: Right;   TRANSNASAL APPROACH N/A 04/14/2023   Procedure: TRANSNASAL APPROACH;  Surgeon: Laren Boom, DO;  Location: [facility] OR;  Service: ENT;  Laterality: N/A;   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   Pituitary adenoma (HCC) 04/14/2023   Primary osteoarthritis of right knee 09/12/2019    ONSET DATE: 04-14-23  REFERRING DIAG:  Diagnosis  E89.3 (ICD-10-CM) - Status post transsphenoidal  pituitary resection (HCC)    THERAPY DIAG:  Muscle weakness (generalized)  Rationale for Evaluation and Treatment: Rehabilitation  SUBJECTIVE:                                                                                                                                                                                             SUBJECTIVE STATEMENT: Pt reports no problems or changes - is not having as much Lt knee pain today as she was  having last week Pt accompanied by: self  PERTINENT HISTORY: Status post transsphenoidal pituitary resection (HCC) Active Problems:   Pituitary adenoma (HCC), primary OA of Lt knee (with need for TKA per pt report)  PAIN:  Are you having pain? Yes - Left knee - 4/10 - chronic pain due to OA (need for TKA);  pt reports cold weather and weight bearing aggravate the pain;  non-weight bearing positions alleviate the pain  PRECAUTIONS: Fall  RED FLAGS: None   WEIGHT BEARING RESTRICTIONS: No  FALLS: Has patient fallen in last 6 months? No  LIVING ENVIRONMENT: Lives with: lives alone Lives in: House/apartment Stairs: Yes: External: 5 steps; on right going up Has following equipment at home: Single point cane and Walker - 2 wheeled  PLOF: Independent with basic ADLs, Independent with household mobility without device, Independent with community mobility with device, Independent with homemaking with ambulation, and Independent with transfers  PATIENT GOALS: improve strength and balance  OBJECTIVE:  Note: Objective measures were completed at Evaluation unless otherwise noted.  DIAGNOSTIC FINDINGS: N/A  COGNITION: Overall cognitive status: Within functional limits for tasks assessed   SENSATION: WFL  COORDINATION: WFL's bil. LE's   POSTURE: No Significant postural limitations  LOWER EXTREMITY ROM:   WFL's bil. LE's   LOWER EXTREMITY MMT:    MMT Right Eval Left Eval  Hip flexion 5 4  Hip extension    Hip abduction    Hip  adduction    Hip internal rotation    Hip external rotation    Knee flexion 5 4  Knee extension 5 4  Ankle dorsiflexion 5 5  Ankle plantarflexion    Ankle inversion    Ankle eversion    (Blank rows = not tested)  BED MOBILITY:  Independent  TRANSFERS: Assistive device utilized: None  Sit to stand: Modified independence  STAIRS:  TBA  GAIT: Gait pattern: antalgic Distance walked: approx. 71' Assistive device utilized: Single point cane and without use of SPC for TUG assessment Level of assistance: Modified independence Comments: increased antalgic gait pattern without use of SPC  FUNCTIONAL TESTS:  5 times sit to stand: 21.03 secs from chair - without UE support Timed up and go (TUG): 21.10 secs without use of SPC 10 meter walk test: 19.37 secs with use of SPC = 1.69 ft/sec   Berg score 44/56   04/27/23 0001  Berg Balance Test  Sit to Stand 4  Standing Unsupported 4  Sitting with Back Unsupported but Feet Supported on Floor or Stool 4  Stand to Sit 3  Transfers 4  Standing Unsupported with Eyes Closed 3  Standing Unsupported with Feet Together 3  From Standing, Reach Forward with Outstretched Arm 4  From Standing Position, Pick up Object from Floor 4  From Standing Position, Turn to Look Behind Over each Shoulder 4  Turn 360 Degrees 2 (Rt= 7.41   Lt= 5.22)  Standing Unsupported, Alternately Place Feet on Step/Stool 1  Standing Unsupported, One Foot in Front 3  Standing on One Leg 1  Total Score 44     PATIENT SURVEYS:  N/A - dx is s/p tumor removal  TODAY'S TREATMENT:  06-04-23  TherEx:  Exercises for LE strengthening in seated and supine positions  Lt hamstring stretch in seated position - Lt foot placed on stool for hamstring stretch - 20 sec hold x 1 rep LLE LAQ 2# weight 10 reps - 3 sec hold Supine exs:  bridging x 10 reps LLE  SLR with 2# weight: Lt  hip flexion in hooklying with 2# weight RLE  SLR with 2#  weight:  hip flexion in hooklying position with 2# weight Bridging with LE extension 5 reps each leg SAQ LLE with 2# weight 10 reps each leg Hip flexion RLE and LLE with 2# weight in hooklying position - 10 reps each leg Hip abduction - RLE and LLE in sidelying positions - 10 reps each - no weight used Clam shell exercise in sidelying position - RLE and LLE with 2# weight 10 reps each leg  Bil. Knee flexion with green theraband for hamstring strengthening - 10 reps each leg - 3 sec hold  Step up exercise 10 reps RLE and LLE onto 6" step with bil. Hand rails for assist and for balance  SciFit level 2.5 x 5" with bil. Ue's and LE's for strengthening   Updated/revised HEP due to c/o Lt knee pain with weight bearing:   Access Code: [email or web address] URL: [email or web address] Date: 05/20/2023 Prepared by: Maebelle Munroe  Exercises - Seated Knee Extension with Resistance  - 1 x daily - 7 x weekly - 1 sets - 10 reps - 4-5 secs  hold - Beginner Bridge  - 1 x daily - 7 x weekly - 1 sets - 10 reps - 3 sec hold - Clam with Resistance  - 1 x daily - 7 x weekly - 1 sets - 10 reps - Sidelying Hip Abduction  - 1 x daily - 7 x weekly - 1 sets - 10 reps - Active Straight Leg Raise with Quad Set  - 1 x daily - 7 x weekly - 1 sets - 10 reps - Supine Hip Flexion  - 1 x daily - 7 x weekly - 1 sets - 10 reps   Pt performed following exercises for HEP  Access Code: [email or web address] URL: [email or web address] Date: 05/14/2023 Prepared by: Maebelle Munroe  Exercises - Seated Hamstring Stretch with Chair  - 1 x daily - 7 x weekly - 1 sets - 1-2 reps - 15-20 sec hold - Standing Gastroc Stretch  - 1 x daily - 7 x weekly - 1 sets - 1-2 reps - 15-20 sec hold - Standing March with Counter Support  - 1 x daily - 7 x weekly - 1 sets - 10 reps - Standing Hip Flexion with Counter Support  - 1 x daily - 7 x weekly - 1 sets  - 10 reps - Standing Hip Abduction with Counter Support  - 1 x daily - 7 x weekly - 3 sets - 10 reps - Standing Hip Extension with Counter Support  - 1 x daily - 7 x weekly - 1 sets - 10 reps - Sit to Stand  - 1 x daily - 7 x weekly - 1 sets - 10 reps - Single Leg Stance with Support  - 1 x daily - 7 x weekly - 1 sets - 2 reps - 10 sec hold - Seated Hamstring Curl with Anchored Resistance  - 1 x daily - 7 x weekly - 1 sets - 10 reps  NeuroRe-ed:  Pt performed standing balance exercises for HEP - see above - UE support needed for  safety   PATIENT EDUCATION: Education details: HEP - Medbridge [id number] Person educated: Patient Education method: Explanation Education comprehension: verbalized understanding  HOME EXERCISE PROGRAM: To be established  GOALS: Goals reviewed with patient? Yes  SHORT TERM GOALS: same as LTG's as ELOS is 4 weeks   LONG TERM GOALS: Target date: 06-04-23 (pushed out 1 week due to holiday week)  Independent in HEP for balance and LE strengthening exercises. Baseline:  Goal status: INITIAL  2.  Improve TUG score to </= 17 secs without use of SPC to demo reduced fall risk. Baseline: 21.10 secs without use of SPC Goal status: INITIAL  3.  Increase gait velocity to >/= 2.0 ft/sec with use of SPC for increased gait efficiency. Baseline: 1.69 ft/sec with use of SPC Goal status: INITIAL  4.  Improve Berg balance test score to >/= 48/56 to reduce fall risk. Baseline: 44/56 Goal status: INITIAL  5.  Improve 5x sit to stand score to </= 17 secs without UE support from chair. Baseline: 21.03 secs without UE support Goal status: INITIAL   ASSESSMENT:  CLINICAL IMPRESSION: PT session focused on bil. LE strengthening and stretching.  Pt's gait is impacted by Lt knee OA with c/o discomfort and decreased AROM, with decreased flexion in swing phase of gait.  Pt continues to use [facility] for assistance with ambulation due to Lt knee OA.  Cont with POC.  OBJECTIVE  IMPAIRMENTS: decreased balance, difficulty walking, and decreased strength.   ACTIVITY LIMITATIONS: bending, standing, stairs, and locomotion level  PARTICIPATION LIMITATIONS: driving, shopping, and community activity  PERSONAL FACTORS: Age, Fitness, and 1 comorbidity: Lt knee OA with need for Lt TKA  are also affecting patient's functional outcome.   REHAB POTENTIAL: Good  CLINICAL DECISION MAKING: Stable/uncomplicated  EVALUATION COMPLEXITY: Low  PLAN:  PT FREQUENCY: 1x/week  PT DURATION: 4 weeks + eval  PLANNED INTERVENTIONS: 97110-Therapeutic exercises, 97530- Therapeutic activity, [id number]- Neuromuscular re-education, 97535- Self Care, [id number]- Gait training, and Patient/Family education  PLAN FOR NEXT SESSION: cont. LE strengthening and balance training   Metzli Pollick, Donavan Burnet, PT 06/05/2023, 4:38 PM

## 2023-06-05 ENCOUNTER — Encounter: Payer: Self-pay | Admitting: Physical Therapy

## 2023-06-08 ENCOUNTER — Encounter: Payer: Medicare Other | Admitting: Occupational Therapy

## 2023-06-08 ENCOUNTER — Encounter: Payer: Self-pay | Admitting: Physical Therapy

## 2023-06-08 ENCOUNTER — Ambulatory Visit: Payer: Medicare Other | Admitting: Physical Therapy

## 2023-06-08 DIAGNOSIS — R2681 Unsteadiness on feet: Secondary | ICD-10-CM

## 2023-06-08 DIAGNOSIS — M6281 Muscle weakness (generalized): Secondary | ICD-10-CM | POA: Diagnosis not present

## 2023-06-08 DIAGNOSIS — R262 Difficulty in walking, not elsewhere classified: Secondary | ICD-10-CM | POA: Diagnosis not present

## 2023-06-08 NOTE — Therapy (Addendum)
 OUTPATIENT PHYSICAL THERAPY NEURO TREATMENT NOTE   Patient Name: Alexis Kramer MRN: [id number] DOB:[birth year], 82 y.o., female Today's Date: 06/09/2023   PCP: Teresa Channel, MD REFERRING PROVIDER: Lanis Pupa, MD  END OF SESSION:  PT End of Session - 06/08/23 1856     Visit Number 6    Number of Visits 7    Date for PT Re-Evaluation 06/04/23    Authorization Type UHC Medicare    Authorization Time Period 04-27-23 - 06-08-23    PT Start Time 1450    PT Stop Time 1532    PT Time Calculation (min) 42 min    Activity Tolerance Patient tolerated treatment well    Behavior During Therapy [facility] for tasks assessed/performed                  Past Medical History:  Diagnosis Date   Anxiety    Arthritis    both knees   Breast cancer (HCC)    Cancer (HCC) 1994   breast cancer ; left side   Depression    situational   Dyspnea    GERD (gastroesophageal reflux disease)    Past Surgical History:  Procedure Laterality Date   ABDOMINAL HYSTERECTOMY     APPENDECTOMY     CRANIOTOMY N/A 04/14/2023   Procedure: Endoscopic endonasal resection of pituitary tumor;  Surgeon: Cheryle Debby LABOR, MD;  Location: [facility] OR;  Service: Neurosurgery;  Laterality: N/A;   EYE SURGERY Bilateral 2021   MASTECTOMY Left 1997   TONSILLECTOMY     TOTAL KNEE ARTHROPLASTY Right 09/12/2019   Procedure: RIGHT TOTAL KNEE ARTHROPLASTY;  Surgeon: Sheril Coy, MD;  Location: [facility] ORS;  Service: Orthopedics;  Laterality: Right;   TRANSNASAL APPROACH N/A 04/14/2023   Procedure: TRANSNASAL APPROACH;  Surgeon: Llewellyn Gerard LABOR, DO;  Location: [facility] OR;  Service: ENT;  Laterality: N/A;   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   Pituitary adenoma (HCC) 04/14/2023   Primary osteoarthritis of right knee 09/12/2019    ONSET DATE: 04-14-23  REFERRING DIAG:  Diagnosis  E89.3 (ICD-10-CM) - Status post transsphenoidal  pituitary resection (HCC)    THERAPY DIAG:  Muscle weakness (generalized)  Unsteadiness on feet  Rationale for Evaluation and Treatment: Rehabilitation  SUBJECTIVE:                                                                                                                                                                                             SUBJECTIVE STATEMENT: Pt reports she is doing OK - no changes or issues Pt accompanied by:  self  PERTINENT HISTORY: Status post transsphenoidal pituitary resection [facility]) Active Problems:   Pituitary adenoma (HCC), primary OA of Lt knee (with need for TKA per pt report)  PAIN:  Are you having pain? Yes - Left knee - 3/10 - chronic pain due to OA (need for TKA);  pt reports cold weather and weight bearing aggravate the pain;  non-weight bearing positions alleviate the pain  PRECAUTIONS: Fall  RED FLAGS: None   WEIGHT BEARING RESTRICTIONS: No  FALLS: Has patient fallen in last 6 months? No  LIVING ENVIRONMENT: Lives with: lives alone Lives in: House/apartment Stairs: Yes: External: 5 steps; on right going up Has following equipment at home: Single point cane and Walker - 2 wheeled  PLOF: Independent with basic ADLs, Independent with household mobility without device, Independent with community mobility with device, Independent with homemaking with ambulation, and Independent with transfers  PATIENT GOALS: improve strength and balance  OBJECTIVE:  Note: Objective measures were completed at Evaluation unless otherwise noted.  DIAGNOSTIC FINDINGS: N/A  COGNITION: Overall cognitive status: Within functional limits for tasks assessed   SENSATION: WFL  COORDINATION: WFL's bil. LE's   POSTURE: No Significant postural limitations  LOWER EXTREMITY ROM:   WFL's bil. LE's   LOWER EXTREMITY MMT:    MMT Right Eval Left Eval  Hip flexion 5 4  Hip extension    Hip abduction    Hip adduction    Hip internal rotation     Hip external rotation    Knee flexion 5 4  Knee extension 5 4  Ankle dorsiflexion 5 5  Ankle plantarflexion    Ankle inversion    Ankle eversion    (Blank rows = not tested)  BED MOBILITY:  Independent  TRANSFERS: Assistive device utilized: None  Sit to stand: Modified independence  STAIRS:  TBA  GAIT: Gait pattern: antalgic Distance walked: approx. 39' Assistive device utilized: Single point cane and without use of SPC for TUG assessment Level of assistance: Modified independence Comments: increased antalgic gait pattern without use of SPC  FUNCTIONAL TESTS:  5 times sit to stand: 21.03 secs from chair - without UE support Timed up and go (TUG): 21.10 secs without use of SPC 10 meter walk test: 19.37 secs with use of SPC = 1.69 ft/sec   Berg score 44/56   04/27/23 0001  Berg Balance Test  Sit to Stand 4  Standing Unsupported 4  Sitting with Back Unsupported but Feet Supported on Floor or Stool 4  Stand to Sit 3  Transfers 4  Standing Unsupported with Eyes Closed 3  Standing Unsupported with Feet Together 3  From Standing, Reach Forward with Outstretched Arm 4  From Standing Position, Pick up Object from Floor 4  From Standing Position, Turn to Look Behind Over each Shoulder 4  Turn 360 Degrees 2 (Rt= 7.41   Lt= 5.22)  Standing Unsupported, Alternately Place Feet on Step/Stool 1  Standing Unsupported, One Foot in Front 3  Standing on One Leg 1  Total Score 44     PATIENT SURVEYS:  N/A - dx is s/p tumor removal  TODAY'S TREATMENT:  06-08-23  TherEx:  Exercises for LE strengthening in seated and supine positions  Lt hamstring stretch in seated position - Lt foot placed on stool for hamstring stretch - 20 sec hold x 1 rep LLE LAQ  10 reps - 3 sec hold - no weight Supine exs:  bridging x 10 reps LLE SLR  10 reps;   Lt  hip flexion in  hooklying 10 reps RLE  SLR 10 reps:  hip flexion in hooklying position 10 reps Bridging with LE extension 5 reps each leg Hip flexion RLE and LLE  - 10 reps each leg Hip abduction - RLE and LLE in sidelying positions - 10 reps each - no weight used Clam shell exercise in sidelying position - RLE and LLE  10 reps each leg - no weight used  Step up exercise 10 reps RLE and LLE onto 6 step with bil. Hand rails for assist and for balance  SciFit level 2.5 x 5 with bil. Ue's and LE's for strengthening  Neuro Re-ed: Standing balance exercises to improve SLS on RLE - pt reported increased pain with attempts to perform LLE SLS due to knee pain so no activities performed on LLE only  Tap ups to 6 step with LLE with 2 finger support on hand rails  Rockerboard with bil. UE support on // bars 10 reps 2 sets  Touching 3 balance bubbles in 1/2 circle pattern with UE support prn for assist with balance  Updated/revised HEP due to c/o Lt knee pain with weight bearing:   Access Code: [email or web address] URL: [email or web address] Date: 05/20/2023 Prepared by: Rock Kussmaul  Exercises - Seated Knee Extension with Resistance  - 1 x daily - 7 x weekly - 1 sets - 10 reps - 4-5 secs  hold - Beginner Bridge  - 1 x daily - 7 x weekly - 1 sets - 10 reps - 3 sec hold - Clam with Resistance  - 1 x daily - 7 x weekly - 1 sets - 10 reps - Sidelying Hip Abduction  - 1 x daily - 7 x weekly - 1 sets - 10 reps - Active Straight Leg Raise with Quad Set  - 1 x daily - 7 x weekly - 1 sets - 10 reps - Supine Hip Flexion  - 1 x daily - 7 x weekly - 1 sets - 10 reps   Pt performed following exercises for HEP  Access Code: [email or web address] URL: [email or web address] Date: 05/14/2023 Prepared by: Rock Kussmaul  Exercises - Seated Hamstring Stretch with Chair  - 1 x daily - 7 x weekly - 1 sets - 1-2 reps - 15-20 sec hold - Standing Gastroc Stretch  - 1 x daily - 7 x weekly - 1 sets - 1-2 reps - 15-20 sec  hold - Standing March with Counter Support  - 1 x daily - 7 x weekly - 1 sets - 10 reps - Standing Hip Flexion with Counter Support  - 1 x daily - 7 x weekly - 1 sets - 10 reps - Standing Hip Abduction with Counter Support  - 1 x daily - 7 x weekly - 3 sets - 10 reps - Standing Hip Extension with Counter Support  - 1 x daily - 7 x weekly - 1 sets - 10 reps - Sit to Stand  - 1 x daily - 7 x weekly - 1 sets - 10 reps - Single Leg Stance with Support  - 1 x daily - 7 x weekly - 1 sets -  2 reps - 10 sec hold - Seated Hamstring Curl with Anchored Resistance  - 1 x daily - 7 x weekly - 1 sets - 10 reps  NeuroRe-ed:  Pt performed standing balance exercises for HEP - see above - UE support needed for safety   PATIENT EDUCATION: Education details: HEP - Medbridge [id number] Person educated: Patient Education method: Explanation Education comprehension: verbalized understanding  HOME EXERCISE PROGRAM: To be established  GOALS: Goals reviewed with patient? Yes  SHORT TERM GOALS: same as LTG's as ELOS is 4 weeks   LONG TERM GOALS: Target date: 06-04-23 (pushed out 1 week due to holiday week)  Independent in HEP for balance and LE strengthening exercises. Baseline:  Goal status: IN PROGRESS  2.  Improve TUG score to </= 17 secs without use of SPC to demo reduced fall risk. Baseline: 21.10 secs without use of SPC Goal status: IN PROGRESS  3.  Increase gait velocity to >/= 2.0 ft/sec with use of SPC for increased gait efficiency. Baseline: 1.69 ft/sec with use of SPC Goal status: IN PROGRESS  4.  Improve Berg balance test score to >/= 48/56 to reduce fall risk. Baseline: 44/56 Goal status: IN PROGRESS  5.  Improve 5x sit to stand score to </= 17 secs without UE support from chair. Baseline: 21.03 secs without UE support Goal status: IN PROGRESS   ASSESSMENT:  CLINICAL IMPRESSION: PT session focused on bil. LE strengthening and stretching.  Pt's gait and balance are impacted by Lt  knee OA with c/o discomfort and decreased AROM.  Pt unable to perform SLS activities on LLE due to c/o Lt knee pain with SLS.  Cont with POC.  OBJECTIVE IMPAIRMENTS: decreased balance, difficulty walking, and decreased strength.   ACTIVITY LIMITATIONS: bending, standing, stairs, and locomotion level  PARTICIPATION LIMITATIONS: driving, shopping, and community activity  PERSONAL FACTORS: Age, Fitness, and 1 comorbidity: Lt knee OA with need for Lt TKA  are also affecting patient's functional outcome.   REHAB POTENTIAL: Good  CLINICAL DECISION MAKING: Stable/uncomplicated  EVALUATION COMPLEXITY: Low  PLAN:  PT FREQUENCY: 1x/week  PT DURATION: 4 weeks + eval  PLANNED INTERVENTIONS: 97110-Therapeutic exercises, 97530- Therapeutic activity, [id number]- Neuromuscular re-education, 97535- Self Care, [id number]- Gait training, and Patient/Family education  PLAN FOR NEXT SESSION:   Check LTG's and D/C next session;  cont. LE strengthening and balance training   Katasha Riga, [place], PT 06/09/2023, 7:01 PM

## 2023-06-15 ENCOUNTER — Encounter: Payer: Medicare Other | Admitting: Occupational Therapy

## 2023-06-15 ENCOUNTER — Ambulatory Visit: Payer: Medicare Other | Attending: Neurosurgery | Admitting: Physical Therapy

## 2023-06-15 DIAGNOSIS — R262 Difficulty in walking, not elsewhere classified: Secondary | ICD-10-CM

## 2023-06-15 DIAGNOSIS — R2681 Unsteadiness on feet: Secondary | ICD-10-CM | POA: Diagnosis not present

## 2023-06-15 DIAGNOSIS — M6281 Muscle weakness (generalized): Secondary | ICD-10-CM

## 2023-06-15 NOTE — Therapy (Signed)
 OUTPATIENT PHYSICAL THERAPY NEURO TREATMENT NOTE/DISCHARGE SUMMARY   Patient Name: Alexis Kramer MRN: [id number] DOB:1941-04-28, 83 y.o., female Today's Date: 06/16/2023   PCP: Teresa Channel, MD REFERRING PROVIDER: Lanis Pupa, MD  END OF SESSION:  PT End of Session - 06/16/23 1830     Visit Number 7    Number of Visits 7    Date for PT Re-Evaluation 06/04/23    Authorization Type [employer] Medicare    Authorization Time Period 04-27-23 - 06-08-23    PT Start Time 1450    PT Stop Time 1534    PT Time Calculation (min) 44 min    Activity Tolerance Patient tolerated treatment well    Behavior During Therapy [facility] for tasks assessed/performed                   Past Medical History:  Diagnosis Date   Anxiety    Arthritis    both knees   Breast cancer (HCC)    Cancer (HCC) 1994   breast cancer ; left side   Depression    situational   Dyspnea    GERD (gastroesophageal reflux disease)    Past Surgical History:  Procedure Laterality Date   ABDOMINAL HYSTERECTOMY     APPENDECTOMY     CRANIOTOMY N/A 04/14/2023   Procedure: Endoscopic endonasal resection of pituitary tumor;  Surgeon: Cheryle Debby LABOR, MD;  Location: [facility] OR;  Service: Neurosurgery;  Laterality: N/A;   EYE SURGERY Bilateral 2021   MASTECTOMY Left 1997   TONSILLECTOMY     TOTAL KNEE ARTHROPLASTY Right 09/12/2019   Procedure: RIGHT TOTAL KNEE ARTHROPLASTY;  Surgeon: Sheril Coy, MD;  Location: [facility] ORS;  Service: Orthopedics;  Laterality: Right;   TRANSNASAL APPROACH N/A 04/14/2023   Procedure: TRANSNASAL APPROACH;  Surgeon: Llewellyn Gerard LABOR, DO;  Location: [facility] OR;  Service: ENT;  Laterality: N/A;   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   Pituitary adenoma (HCC) 04/14/2023   Primary osteoarthritis of right knee 09/12/2019    ONSET DATE: 04-14-23  REFERRING DIAG:  Diagnosis  E89.3 (ICD-10-CM) - Status post  transsphenoidal pituitary resection (HCC)    THERAPY DIAG:  Muscle weakness (generalized)  Unsteadiness on feet  Difficulty in walking, not elsewhere classified  Rationale for Evaluation and Treatment: Rehabilitation  SUBJECTIVE:                                                                                                                                                                                             SUBJECTIVE STATEMENT: Pt reports she is pleased  with progress made - feels balance is better and feels stronger Pt accompanied by: self  PERTINENT HISTORY: Status post transsphenoidal pituitary resection [facility]) Active Problems:   Pituitary adenoma (HCC), primary OA of Lt knee (with need for TKA per pt report)  PAIN:  Are you having pain? Yes - Left knee - 3/10 - chronic pain due to OA (need for TKA);  pt reports cold weather and weight bearing aggravate the pain;  non-weight bearing positions alleviate the pain  PRECAUTIONS: Fall  RED FLAGS: None   WEIGHT BEARING RESTRICTIONS: No  FALLS: Has patient fallen in last 6 months? No  LIVING ENVIRONMENT: Lives with: lives alone Lives in: House/apartment Stairs: Yes: External: 5 steps; on right going up Has following equipment at home: Single point cane and Walker - 2 wheeled  PLOF: Independent with basic ADLs, Independent with household mobility without device, Independent with community mobility with device, Independent with homemaking with ambulation, and Independent with transfers  PATIENT GOALS: improve strength and balance  OBJECTIVE:  Note: Objective measures were completed at Evaluation unless otherwise noted.  DIAGNOSTIC FINDINGS: N/A  COGNITION: Overall cognitive status: Within functional limits for tasks assessed   SENSATION: WFL  COORDINATION: WFL's bil. LE's   POSTURE: No Significant postural limitations  LOWER EXTREMITY ROM:   WFL's bil. LE's   LOWER EXTREMITY MMT:    MMT Right Eval  Left Eval  Hip flexion 5 4  Hip extension    Hip abduction    Hip adduction    Hip internal rotation    Hip external rotation    Knee flexion 5 4  Knee extension 5 4  Ankle dorsiflexion 5 5  Ankle plantarflexion    Ankle inversion    Ankle eversion    (Blank rows = not tested)  BED MOBILITY:  Independent  TRANSFERS: Assistive device utilized: None  Sit to stand: Modified independence  STAIRS:  TBA  GAIT: Gait pattern: antalgic Distance walked: approx. 34' Assistive device utilized: Single point cane and without use of SPC for TUG assessment Level of assistance: Modified independence Comments: increased antalgic gait pattern without use of SPC  FUNCTIONAL TESTS:  5 times sit to stand: 21.03 secs from chair - without UE support Timed up and go (TUG): 21.10 secs without use of SPC 10 meter walk test: 19.37 secs with use of SPC = 1.69 ft/sec   Berg score 44/56   04/27/23 0001  Berg Balance Test  Sit to Stand 4  Standing Unsupported 4  Sitting with Back Unsupported but Feet Supported on Floor or Stool 4  Stand to Sit 3  Transfers 4  Standing Unsupported with Eyes Closed 3  Standing Unsupported with Feet Together 3  From Standing, Reach Forward with Outstretched Arm 4  From Standing Position, Pick up Object from Floor 4  From Standing Position, Turn to Look Behind Over each Shoulder 4  Turn 360 Degrees 2 (Rt= 7.41   Lt= 5.22)  Standing Unsupported, Alternately Place Feet on Step/Stool 1  Standing Unsupported, One Foot in Front 3  Standing on One Leg 1  Total Score 44     PATIENT SURVEYS:  N/A - dx is s/p tumor removal  TODAY'S TREATMENT:  06-15-23  TherEx: 5x sit to stand score = 18.85 secs without UE support from chair  Gait: Gait velocity:  19.56, 17.91 = 1.83 ft/sec with SPC  Neuro Re-ed: Berg score 48/56  TUG score 17.87 secs without  use of SPC  Standing balance exercises to improve SLS on RLE - pt reported increased pain with attempts to perform LLE SLS due to knee pain so no activities performed on LLE only  Tap ups to 6 step with LLE with 2 finger support on hand rails  Rockerboard with bil. UE support on // bars 10 reps 2 sets  Touching 3 balance bubbles in 1/2 circle pattern with UE support prn for assist with balance  Updated/revised HEP due to c/o Lt knee pain with weight bearing:   Access Code: [email or web address] URL: [email or web address] Date: 05/20/2023 Prepared by: Rock Kussmaul  Exercises - Seated Knee Extension with Resistance  - 1 x daily - 7 x weekly - 1 sets - 10 reps - 4-5 secs  hold - Beginner Bridge  - 1 x daily - 7 x weekly - 1 sets - 10 reps - 3 sec hold - Clam with Resistance  - 1 x daily - 7 x weekly - 1 sets - 10 reps - Sidelying Hip Abduction  - 1 x daily - 7 x weekly - 1 sets - 10 reps - Active Straight Leg Raise with Quad Set  - 1 x daily - 7 x weekly - 1 sets - 10 reps - Supine Hip Flexion  - 1 x daily - 7 x weekly - 1 sets - 10 reps   Pt performed following exercises for HEP  Access Code: [email or web address] URL: [email or web address] Date: 05/14/2023 Prepared by: Rock Kussmaul  Exercises - Seated Hamstring Stretch with Chair  - 1 x daily - 7 x weekly - 1 sets - 1-2 reps - 15-20 sec hold - Standing Gastroc Stretch  - 1 x daily - 7 x weekly - 1 sets - 1-2 reps - 15-20 sec hold - Standing March with Counter Support  - 1 x daily - 7 x weekly - 1 sets - 10 reps - Standing Hip Flexion with Counter Support  - 1 x daily - 7 x weekly - 1 sets - 10 reps - Standing Hip Abduction with Counter Support  - 1 x daily - 7 x weekly - 3 sets - 10 reps - Standing Hip Extension with Counter Support  - 1 x daily - 7 x weekly - 1 sets - 10 reps - Sit to Stand  - 1 x daily - 7 x weekly - 1 sets - 10 reps - Single Leg Stance with Support  - 1 x daily - 7 x weekly - 1 sets - 2 reps - 10 sec  hold - Seated Hamstring Curl with Anchored Resistance  - 1 x daily - 7 x weekly - 1 sets - 10 reps  NeuroRe-ed:  Pt performed standing balance exercises for HEP - see above - UE support needed for safety   PATIENT EDUCATION: Education details: HEP - Medbridge [id number] Person educated: Patient Education method: Explanation Education comprehension: verbalized understanding  HOME EXERCISE PROGRAM: To be established  GOALS: Goals reviewed with patient? Yes  SHORT TERM GOALS: same as LTG's as ELOS is 4 weeks   LONG TERM GOALS: Target date: 06-04-23 (pushed out 1 week due to holiday week)  Independent in HEP for balance and LE strengthening exercises. Baseline:  Goal status:  Goal met 06-15-23  2.  Improve  TUG score to </= 17 secs without use of SPC to demo reduced fall risk. Baseline: 21.10 secs without use of SPC;  TUG score 17.87 secs without use of SPC Goal status: Partially met 06-15-23  3.  Increase gait velocity to >/= 2.0 ft/sec with use of SPC for increased gait efficiency. Baseline: 1.69 ft/sec with use of SPC;  06-15-23 =   19.56, 17.91 = 1.83 ft/sec with SPC Goal status: Partially met 06-15-23  4.  Improve Berg balance test score to >/= 48/56 to reduce fall risk. Baseline: 44/56; 48/56 on 06-15-23 Goal status: MET  5.  Improve 5x sit to stand score to </= 17 secs without UE support from chair. Baseline: 21.03 secs without UE support;  18.85 secs without UE support from chair - 06-15-23 Goal status: Partially met    ASSESSMENT:  CLINICAL IMPRESSION: PT session focused on LTG assessment for discharge and balance training to improve SLS on RLE.  Pt continues to have Lt knee pain due to OA, with need for TKA, and is unable to tolerate SLS on LLE without UE support.  Pt has met LTG #1 and #4, with Berg score improving from 44/56 to 48/56.  LTG's #2, 3 and 5 are partially met as scores have improved since initial eval scores but not to stated goal levels.  Pt is discharged due to  completion of program and no further needs identified at this time.    OBJECTIVE IMPAIRMENTS: decreased balance, difficulty walking, and decreased strength.   ACTIVITY LIMITATIONS: bending, standing, stairs, and locomotion level  PARTICIPATION LIMITATIONS: driving, shopping, and community activity  PERSONAL FACTORS: Age, Fitness, and 1 comorbidity: Lt knee OA with need for Lt TKA  are also affecting patient's functional outcome.   REHAB POTENTIAL: Good  CLINICAL DECISION MAKING: Stable/uncomplicated  EVALUATION COMPLEXITY: Low  PLAN:  PT FREQUENCY: 1x/week  PT DURATION: 4 weeks + eval  PLANNED INTERVENTIONS: 97110-Therapeutic exercises, 97530- Therapeutic activity, 97112- Neuromuscular re-education, [phone]- Self Care, [id number]- Gait training, and Patient/Family education  PLAN FOR NEXT SESSION: D/C on 06-15-23   PHYSICAL THERAPY DISCHARGE SUMMARY  Visits from Start of Care: 7  Current functional level related to goals / functional outcomes: See above for progress towards goals   Remaining deficits: Continued decreased high level balance skills and decreased SLS on LLE due to Lt knee pain due to OA;  continued decreased independence with gait due to Lt knee pain   Education / Equipment: Pt has been instructed in HEP for balance and strengthening LE's.    Patient agrees to discharge. Patient goals were partially met. Patient is being discharged due to meeting the stated rehab goals. And completion of certification period.  No further needs identified at this time.   Roxanna [place], PT 06/16/2023, 6:33 PM

## 2023-06-16 ENCOUNTER — Encounter: Payer: Self-pay | Admitting: Physical Therapy

## 2023-06-16 DIAGNOSIS — Z9889 Other specified postprocedural states: Secondary | ICD-10-CM | POA: Diagnosis not present

## 2023-06-16 DIAGNOSIS — J3489 Other specified disorders of nose and nasal sinuses: Secondary | ICD-10-CM | POA: Diagnosis not present

## 2023-06-16 NOTE — Progress Notes (Signed)
   06/16/23 0001  Berg Balance Test  Sit to Stand 4  Standing Unsupported 4  Sitting with Back Unsupported but Feet Supported on Floor or Stool 4  Stand to Sit 4  Transfers 4  Standing Unsupported with Eyes Closed 4  Standing Unsupported with Feet Together 4  From Standing, Reach Forward with Outstretched Arm 4  From Standing Position, Pick up Object from Floor 4  From Standing Position, Turn to Look Behind Over each Shoulder 4  Turn 360 Degrees 2  Standing Unsupported, Alternately Place Feet on Step/Stool 2  Standing Unsupported, One Foot in Front 3  Standing on One Leg 1  Total Score 48

## 2023-07-09 DIAGNOSIS — M1712 Unilateral primary osteoarthritis, left knee: Secondary | ICD-10-CM | POA: Diagnosis not present

## 2023-07-16 DIAGNOSIS — L280 Lichen simplex chronicus: Secondary | ICD-10-CM | POA: Diagnosis not present

## 2023-08-13 DIAGNOSIS — R7303 Prediabetes: Secondary | ICD-10-CM | POA: Diagnosis not present

## 2023-08-13 DIAGNOSIS — E785 Hyperlipidemia, unspecified: Secondary | ICD-10-CM | POA: Diagnosis not present

## 2023-08-13 DIAGNOSIS — E559 Vitamin D deficiency, unspecified: Secondary | ICD-10-CM | POA: Diagnosis not present

## 2023-08-13 DIAGNOSIS — R350 Frequency of micturition: Secondary | ICD-10-CM | POA: Diagnosis not present

## 2023-08-13 DIAGNOSIS — Z01818 Encounter for other preprocedural examination: Secondary | ICD-10-CM | POA: Diagnosis not present

## 2023-08-13 DIAGNOSIS — F5101 Primary insomnia: Secondary | ICD-10-CM | POA: Diagnosis not present

## 2023-08-13 DIAGNOSIS — J309 Allergic rhinitis, unspecified: Secondary | ICD-10-CM | POA: Diagnosis not present

## 2023-08-13 DIAGNOSIS — M1712 Unilateral primary osteoarthritis, left knee: Secondary | ICD-10-CM | POA: Diagnosis not present

## 2023-09-13 DIAGNOSIS — M1732 Unilateral post-traumatic osteoarthritis, left knee: Secondary | ICD-10-CM | POA: Diagnosis not present

## 2023-09-13 DIAGNOSIS — M25662 Stiffness of left knee, not elsewhere classified: Secondary | ICD-10-CM | POA: Diagnosis not present

## 2023-09-13 DIAGNOSIS — R262 Difficulty in walking, not elsewhere classified: Secondary | ICD-10-CM | POA: Diagnosis not present

## 2023-09-14 DIAGNOSIS — D497 Neoplasm of unspecified behavior of endocrine glands and other parts of nervous system: Secondary | ICD-10-CM | POA: Diagnosis not present

## 2023-09-15 ENCOUNTER — Other Ambulatory Visit: Payer: Self-pay | Admitting: Orthopaedic Surgery

## 2023-09-22 NOTE — Progress Notes (Addendum)
 Anesthesia Review:  PCP: Victorio Grave  Cardiologist : none   PPM/ ICD: Device Orders: Rep Notified:  Chest x-ray : 03/23/23- 2 view  EKG : 02/26/23  Echo : Stress test: Cardiac Cath :   Activity level: can do a flight of stairs without difficutly  Sleep Study/ CPAP : none  Fasting Blood Sugar :      / Checks Blood Sugar -- times a day:    Blood Thinner/ Instructions /Last Dose: ASA / Instructions/ Last Dose :    04/14/23 PItuitary tumor surgery

## 2023-09-22 NOTE — Patient Instructions (Addendum)
 SURGICAL WAITING ROOM VISITATION  Patients having surgery or a procedure may have no more than 2 support people in the waiting area - these visitors may rotate.    Children under the age of 65 must have an adult with them who is not the patient.  Due to an increase in RSV and influenza rates and associated hospitalizations, children ages 34 and under may not visit patients in [facility] hospitals.  Visitors with respiratory illnesses are discouraged from visiting and should remain at home.  If the patient needs to stay at the hospital during part of their recovery, the visitor guidelines for inpatient rooms apply. Pre-op nurse will coordinate an appropriate time for 1 support person to accompany patient in pre-op.  This support person may not rotate.    Please refer to the [facility] website for the visitor guidelines for Inpatients (after your surgery is over and you are in a regular room).       Your procedure is scheduled on:  09/30/2023    Report to [facility] Main Entrance    Report to admitting at  0515 AM   Call this number if you have problems the morning of surgery [phone]   Do not eat food :After Midnight.   After Midnight you may have the following liquids until _ 0430_____ Am  DAY OF SURGERY  Water  Non-Citrus Juices (without pulp, NO RED-Apple, White grape, White cranberry) Black Coffee (NO MILK/CREAM OR CREAMERS, sugar ok)  Clear Tea (NO MILK/CREAM OR CREAMERS, sugar ok) regular and decaf                             Plain Jell-O (NO RED)                                           Fruit ices (not with fruit pulp, NO RED)                                     Popsicles (NO RED)                                                               Sports drinks like Gatorade (NO RED)                    The day of surgery:  Drink ONE (1) Pre-Surgery Clear Ensure or G2 at  0430AM ( have completed by )  the morning of surgery. Drink in one sitting. Do not sip.   This drink was given to you during your hospital  pre-op appointment visit. Nothing else to drink after completing the  Pre-Surgery Clear Ensure or G2.          If you have questions, please contact your surgeon's office.       Oral Hygiene is also important to reduce your risk of infection.  Remember - BRUSH YOUR TEETH THE MORNING OF SURGERY WITH YOUR REGULAR TOOTHPASTE  DENTURES WILL BE REMOVED PRIOR TO SURGERY PLEASE DO NOT APPLY "Poly grip" OR ADHESIVES!!!   Do NOT smoke after Midnight   Stop all vitamins and herbal supplements 7 days before surgery.   Take these medicines the morning of surgery with A SIP OF WATER :  none   DO NOT TAKE ANY ORAL DIABETIC MEDICATIONS DAY OF YOUR SURGERY  Bring CPAP mask and tubing day of surgery.                              You may not have any metal on your body including hair pins, jewelry, and body piercing             Do not wear make-up, lotions, powders, perfumes/cologne, or deodorant  Do not wear nail polish including gel and S&S, artificial/acrylic nails, or any other type of covering on natural nails including finger and toenails. If you have artificial nails, gel coating, etc. that needs to be removed by a nail salon please have this removed prior to surgery or surgery may need to be canceled/ delayed if the surgeon/ anesthesia feels like they are unable to be safely monitored.   Do not shave  48 hours prior to surgery.               Men may shave face and neck.   Do not bring valuables to the hospital. [place] [facility] IS NOT             RESPONSIBLE   FOR VALUABLES.   Contacts, glasses, dentures or bridgework may not be worn into surgery.   Bring small overnight bag day of surgery.   DO NOT BRING YOUR HOME MEDICATIONS TO THE HOSPITAL. PHARMACY WILL DISPENSE MEDICATIONS LISTED ON YOUR MEDICATION LIST TO YOU DURING YOUR ADMISSION IN THE HOSPITAL!    Patients discharged on the day of surgery will  not be allowed to drive home.  Someone NEEDS to stay with you for the first 24 hours after anesthesia.   Special Instructions: Bring a copy of your healthcare power of attorney and living will documents the day of surgery if you haven't scanned them before.              Please read over the following fact sheets you were given: IF YOU HAVE QUESTIONS ABOUT YOUR PRE-OP INSTRUCTIONS PLEASE CALL [phone]   If you received a COVID test during your pre-op visit  it is requested that you wear a mask when out in public, stay away from anyone that may not be feeling well and notify your surgeon if you develop symptoms. If you test positive for Covid or have been in contact with anyone that has tested positive in the last 10 days please notify you surgeon.      Pre-operative 5 CHG Bath Instructions   You can play a key role in reducing the risk of infection after surgery. Your skin needs to be as free of germs as possible. You can reduce the number of germs on your skin by washing with CHG (chlorhexidine  gluconate) soap before surgery. CHG is an antiseptic soap that kills germs and continues to kill germs even after washing.   DO NOT use if you have an allergy to chlorhexidine /CHG or antibacterial soaps. If your skin becomes reddened or irritated, stop using the CHG and notify one of our RNs at  [phone].   Please shower with the CHG soap starting 4 days before surgery using the following schedule:     Please keep in mind the following:  DO NOT shave, including legs and underarms, starting the day of your first shower.   You may shave your face at any point before/day of surgery.  Place clean sheets on your bed the day you start using CHG soap. Use a clean washcloth (not used since being washed) for each shower. DO NOT sleep with pets once you start using the CHG.   CHG Shower Instructions:  If you choose to wash your hair and private area, wash first with your normal shampoo/soap.  After  you use shampoo/soap, rinse your hair and body thoroughly to remove shampoo/soap residue.  Turn the water  OFF and apply about 3 tablespoons (45 ml) of CHG soap to a CLEAN washcloth.  Apply CHG soap ONLY FROM YOUR NECK DOWN TO YOUR TOES (washing for 3-5 minutes)  DO NOT use CHG soap on face, private areas, open wounds, or sores.  Pay special attention to the area where your surgery is being performed.  If you are having back surgery, having someone wash your back for you may be helpful. Wait 2 minutes after CHG soap is applied, then you may rinse off the CHG soap.  Pat dry with a clean towel  Put on clean clothes/pajamas   If you choose to wear lotion, please use ONLY the CHG-compatible lotions on the back of this paper.     Additional instructions for the day of surgery: DO NOT APPLY any lotions, deodorants, cologne, or perfumes.   Put on clean/comfortable clothes.  Brush your teeth.  Ask your nurse before applying any prescription medications to the skin.      CHG Compatible Lotions   Aveeno Moisturizing lotion  Cetaphil Moisturizing Cream  Cetaphil Moisturizing Lotion  Clairol Herbal Essence Moisturizing Lotion, Dry Skin  Clairol Herbal Essence Moisturizing Lotion, Extra Dry Skin  Clairol Herbal Essence Moisturizing Lotion, Normal Skin  Curel Age Defying Therapeutic Moisturizing Lotion with Alpha Hydroxy  Curel Extreme Care Body Lotion  Curel Soothing Hands Moisturizing Hand Lotion  Curel Therapeutic Moisturizing Cream, Fragrance-Free  Curel Therapeutic Moisturizing Lotion, Fragrance-Free  Curel Therapeutic Moisturizing Lotion, Original Formula  Eucerin Daily Replenishing Lotion  Eucerin Dry Skin Therapy Plus Alpha Hydroxy Crme  Eucerin Dry Skin Therapy Plus Alpha Hydroxy Lotion  Eucerin Original Crme  Eucerin Original Lotion  Eucerin Plus Crme Eucerin Plus Lotion  Eucerin TriLipid Replenishing Lotion  Keri Anti-Bacterial Hand Lotion  Keri Deep Conditioning Original  Lotion Dry Skin Formula Softly Scented  Keri Deep Conditioning Original Lotion, Fragrance Free Sensitive Skin Formula  Keri Lotion Fast Absorbing Fragrance Free Sensitive Skin Formula  Keri Lotion Fast Absorbing Softly Scented Dry Skin Formula  Keri Original Lotion  Keri Skin Renewal Lotion Keri Silky Smooth Lotion  Keri Silky Smooth Sensitive Skin Lotion  Nivea Body Creamy Conditioning Oil  Nivea Body Extra Enriched Teacher, adult education Moisturizing Lotion Nivea Crme  Nivea Skin Firming Lotion  NutraDerm 30 Skin Lotion  NutraDerm Skin Lotion  NutraDerm Therapeutic Skin Cream  NutraDerm Therapeutic Skin Lotion  ProShield Protective Hand Cream  Provon moisturizing lotion

## 2023-09-28 NOTE — Care Plan (Signed)
 Ortho Bundle Case Management Note  Patient Details  Name: Alexis Kramer MRN: [id number] Date of Birth: 1940/08/19  spoke with patient. states that she will discharge to home with family to assist. rolling walker orderd for home use. HHPT referral to [facility] and OPPT set up with [facility] OPPT- [address] discharge instructions mailed to patient and discussed. states no questions today. Patient and MD in agreement with plan. Choice offered.                     DME Arranged:  Otho Blitz rolling DME Agency:  [employer]  HH Arranged:  PT HH Agency:  [facility]  Additional Comments: Please contact me with any questions of if this plan should need to change.  Cornelia Dieter,  RN,BSN,MHA,CCM  [facility] Orthopaedic Specialist  [phone] 09/28/2023, 4:46 PM

## 2023-09-29 ENCOUNTER — Other Ambulatory Visit: Payer: Self-pay

## 2023-09-29 ENCOUNTER — Encounter (HOSPITAL_COMMUNITY)
Admission: RE | Admit: 2023-09-29 | Discharge: 2023-09-29 | Disposition: A | Discharge status: Home / Self Care | Source: Ambulatory Visit | Attending: Orthopaedic Surgery | Admitting: Orthopaedic Surgery

## 2023-09-29 ENCOUNTER — Encounter (HOSPITAL_COMMUNITY): Payer: Self-pay

## 2023-09-29 VITALS — BP 132/73 | HR 62 | Temp 98.1°F | Resp 16 | Ht 60.0 in | Wt 121.0 lb

## 2023-09-29 DIAGNOSIS — Z01812 Encounter for preprocedural laboratory examination: Secondary | ICD-10-CM | POA: Diagnosis not present

## 2023-09-29 DIAGNOSIS — Z01818 Encounter for other preprocedural examination: Secondary | ICD-10-CM

## 2023-09-29 HISTORY — DX: Prediabetes: R73.03

## 2023-09-29 LAB — CBC
HCT: 46 % (ref 36.0–46.0)
Hemoglobin: 14.3 g/dL (ref 12.0–15.0)
MCH: 29.7 pg (ref 26.0–34.0)
MCHC: 31.1 g/dL (ref 30.0–36.0)
MCV: 95.4 fL (ref 80.0–100.0)
Platelets: 192 10*3/uL (ref 150–400)
RBC: 4.82 MIL/uL (ref 3.87–5.11)
RDW: 16 % — ABNORMAL HIGH (ref 11.5–15.5)
WBC: 8.4 10*3/uL (ref 4.0–10.5)
nRBC: 0 % (ref 0.0–0.2)

## 2023-09-29 LAB — SURGICAL PCR SCREEN
MRSA, PCR: NEGATIVE
Staphylococcus aureus: NEGATIVE

## 2023-09-29 NOTE — H&P (Signed)
 TOTAL KNEE ADMISSION H&P  Patient is being admitted for left total knee arthroplasty.  Subjective:  Chief Complaint:left knee pain.  HPI: Alexis Kramer, 83 y.o. female, has a history of pain and functional disability in the left knee due to arthritis and has failed non-surgical conservative treatments for greater than 12 weeks to includeNSAID's and/or analgesics, corticosteriod injections, viscosupplementation injections, flexibility and strengthening excercises, supervised PT with diminished ADL's post treatment, use of assistive devices, weight reduction as appropriate, and activity modification.  Onset of symptoms was gradual, starting 5 years ago with gradually worsening course since that time. The patient noted no past surgery on the left knee(s).  Patient currently rates pain in the left knee(s) at 10 out of 10 with activity. Patient has night pain, worsening of pain with activity and weight bearing, pain that interferes with activities of daily living, crepitus, and joint swelling.  Patient has evidence of subchondral cysts, subchondral sclerosis, periarticular osteophytes, and joint space narrowing by imaging studies. There is no active infection.  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   Pituitary adenoma (HCC) 04/14/2023   Primary osteoarthritis of right knee 09/12/2019   Past Medical History:  Diagnosis Date   Anxiety    Arthritis    both knees   Breast cancer (HCC)    Cancer (HCC) 1994   breast cancer ; left side   Depression    situational   Dyspnea    GERD (gastroesophageal reflux disease)     Past Surgical History:  Procedure Laterality Date   ABDOMINAL HYSTERECTOMY     APPENDECTOMY     CRANIOTOMY N/A 04/14/2023   Procedure: Endoscopic endonasal resection of pituitary tumor;  Surgeon: Cannon Champion, MD;  Location: [facility] OR;  Service: Neurosurgery;  Laterality: N/A;   EYE SURGERY  Bilateral 2021   MASTECTOMY Left 1997   TONSILLECTOMY     TOTAL KNEE ARTHROPLASTY Right 09/12/2019   Procedure: RIGHT TOTAL KNEE ARTHROPLASTY;  Surgeon: Dayne Even, MD;  Location: [facility];  Service: Orthopedics;  Laterality: Right;   TRANSNASAL APPROACH N/A 04/14/2023   Procedure: TRANSNASAL APPROACH;  Surgeon: Daleen Dubs, DO;  Location: [facility] OR;  Service: ENT;  Laterality: N/A;    No current facility-administered medications for this encounter.   Current Outpatient Medications  Medication Sig Dispense Refill Last Dose/Taking   acetaminophen  (TYLENOL ) 325 MG tablet Take 2 tablets (650 mg total) by mouth every 4 (four) hours as needed for mild pain (pain score 1-3) (temp > 100.5).   Taking As Needed   carboxymethylcellulose (REFRESH PLUS) 0.5 % SOLN Place 1 drop into both eyes 3 (three) times daily as needed (dry eyes).   Taking As Needed   Cholecalciferol (VITAMIN D3) 50 MCG (2000 UT) TABS Take 2,000 Units by mouth daily.   Taking   oxymetazoline  (AFRIN) 0.05 % nasal spray Place 1 spray into both nostrils 2 (two) times daily as needed (epistaxis). 30 mL 0 Taking As Needed   pravastatin  (PRAVACHOL ) 80 MG tablet Take 80 mg by mouth daily.   Taking   traZODone  (DESYREL ) 100 MG tablet Take 100 mg by mouth at bedtime as needed for sleep.   Taking As Needed   No Known Allergies  Social History   Tobacco Use   Smoking status: Never   Smokeless tobacco: Never  Substance Use Topics   Alcohol  use: Never    Family History  Problem Relation Age of Onset  Breast cancer Sister 40   Breast cancer Sister      Review of Systems  Musculoskeletal:  Positive for arthralgias.       Left Knee  All other systems reviewed and are negative.   Objective:  Physical Exam Constitutional:      Appearance: Normal appearance.  HENT:     Head: Normocephalic and atraumatic.     Nose: Nose normal.     Mouth/Throat:     Pharynx: Oropharynx is clear.  Eyes:     Extraocular Movements:  Extraocular movements intact.  Pulmonary:     Effort: Pulmonary effort is normal.  Abdominal:     Palpations: Abdomen is soft.  Musculoskeletal:     Cervical back: Normal range of motion.     Comments: Left knee motion is about 10-90.  She has medial joint line pain and crepitation.  Opposite knee moves better.  Hip motion is full on both sides and straight leg raise is negative.    Skin:    General: Skin is warm and dry.  Neurological:     General: No focal deficit present.     Mental Status: She is alert and oriented to person, place, and time. Mental status is at baseline.  Psychiatric:        Mood and Affect: Mood normal.        Behavior: Behavior normal.        Thought Content: Thought content normal.        Judgment: Judgment normal.     Vital signs in last 24 hours:    Labs:   Estimated body mass index is 25.39 kg/m as calculated from the following:   Height as of 04/14/23: 5' (1.524 m).   Weight as of 04/14/23: 59 kg.   Imaging Review Plain radiographs demonstrate severe degenerative joint disease of the left knee(s). The overall alignment isneutral. The bone quality appears to be good for age and reported activity level.      Assessment/Plan:  End stage primary arthritis, left knee   The patient history, physical examination, clinical judgment of the provider and imaging studies are consistent with end stage degenerative joint disease of the left knee(s) and total knee arthroplasty is deemed medically necessary. The treatment options including medical management, injection therapy arthroscopy and arthroplasty were discussed at length. The risks and benefits of total knee arthroplasty were presented and reviewed. The risks due to aseptic loosening, infection, stiffness, patella tracking problems, thromboembolic complications and other imponderables were discussed. The patient acknowledged the explanation, agreed to proceed with the plan and consent was signed.  Patient is being admitted for inpatient treatment for surgery, pain control, PT, OT, prophylactic antibiotics, VTE prophylaxis, progressive ambulation and ADL's and discharge planning. The patient is planning to be discharged home with home health services     Patient's anticipated LOS is less than 2 midnights, meeting these requirements: - Younger than 31 - Lives within 1 hour of care - Has a competent adult at home to recover with post-op recover - NO history of  - Chronic pain requiring opiods  - Diabetes  - Coronary Artery Disease  - Heart failure  - Heart attack  - Stroke  - DVT/VTE  - Cardiac arrhythmia  - Respiratory Failure/COPD  - Renal failure  - Anemia  - Advanced Liver disease

## 2023-10-04 MED ORDER — TRANEXAMIC ACID 1000 MG/10ML IV SOLN
2000.0000 mg | INTRAVENOUS | Status: DC
  Filled 2023-10-04: qty 20

## 2023-10-04 NOTE — Anesthesia Preprocedure Evaluation (Signed)
 Anesthesia Evaluation  Patient identified by MRN, date of birth, ID band Patient awake    Reviewed: Allergy & Precautions, NPO status , Patient's Chart, lab work & pertinent test results  Airway Mallampati: II  TM Distance: >3 FB Neck ROM: Full    Dental no notable dental hx.    Pulmonary former smoker   Pulmonary exam normal        Cardiovascular negative cardio ROS  Rhythm:Regular Rate:Normal     Neuro/Psych   Anxiety Depression    negative neurological ROS     GI/Hepatic Neg liver ROS,GERD  ,,  Endo/Other  negative endocrine ROS    Renal/GU negative Renal ROS  negative genitourinary   Musculoskeletal  (+) Arthritis , Osteoarthritis,    Abdominal Normal abdominal exam  (+)   Peds  Hematology Lab Results      Component                Value               Date                      WBC                      8.4                 09/29/2023                HGB                      14.3                09/29/2023                HCT                      46.0                09/29/2023                MCV                      95.4                09/29/2023                PLT                      192                 09/29/2023              Anesthesia Other Findings   Reproductive/Obstetrics                             Anesthesia Physical Anesthesia Plan  ASA: 2  Anesthesia Plan: MAC, Spinal and Regional   Post-op Pain Management: Regional block*   Induction: Intravenous  PONV Risk Score and Plan: 2 and Ondansetron , Dexamethasone , Propofol  infusion and Treatment may vary due to age or medical condition  Airway Management Planned: Simple Face Mask and Nasal Cannula  Additional Equipment: None  Intra-op Plan:   Post-operative Plan:   Informed Consent: I have reviewed the patients History and Physical, chart, labs and discussed the procedure including the risks, benefits and alternatives for  the proposed  anesthesia with the patient or authorized representative who has indicated his/her understanding and acceptance.     Dental advisory given  Plan Discussed with: CRNA  Anesthesia Plan Comments:        Anesthesia Quick Evaluation

## 2023-10-05 ENCOUNTER — Other Ambulatory Visit: Payer: Self-pay

## 2023-10-05 ENCOUNTER — Observation Stay (HOSPITAL_COMMUNITY)
Admission: RE | Admit: 2023-10-05 | Discharge: 2023-10-07 | Disposition: A | Discharge status: Home / Self Care | Attending: Orthopaedic Surgery | Admitting: Orthopaedic Surgery

## 2023-10-05 ENCOUNTER — Ambulatory Visit (HOSPITAL_BASED_OUTPATIENT_CLINIC_OR_DEPARTMENT_OTHER): Admitting: Anesthesiology

## 2023-10-05 ENCOUNTER — Encounter (HOSPITAL_COMMUNITY)
Admission: RE | Disposition: A | Discharge status: Home / Self Care | Payer: Self-pay | Source: Home / Self Care | Attending: Orthopaedic Surgery

## 2023-10-05 ENCOUNTER — Ambulatory Visit (HOSPITAL_COMMUNITY): Payer: Self-pay | Admitting: Physician Assistant

## 2023-10-05 ENCOUNTER — Encounter (HOSPITAL_COMMUNITY): Payer: Self-pay | Admitting: Orthopaedic Surgery

## 2023-10-05 DIAGNOSIS — Z96651 Presence of right artificial knee joint: Secondary | ICD-10-CM | POA: Insufficient documentation

## 2023-10-05 DIAGNOSIS — Z853 Personal history of malignant neoplasm of breast: Secondary | ICD-10-CM | POA: Diagnosis not present

## 2023-10-05 DIAGNOSIS — Z01818 Encounter for other preprocedural examination: Secondary | ICD-10-CM

## 2023-10-05 DIAGNOSIS — M1712 Unilateral primary osteoarthritis, left knee: Secondary | ICD-10-CM

## 2023-10-05 DIAGNOSIS — Z79899 Other long term (current) drug therapy: Secondary | ICD-10-CM | POA: Insufficient documentation

## 2023-10-05 DIAGNOSIS — G8918 Other acute postprocedural pain: Secondary | ICD-10-CM | POA: Diagnosis not present

## 2023-10-05 DIAGNOSIS — Z96652 Presence of left artificial knee joint: Secondary | ICD-10-CM | POA: Diagnosis not present

## 2023-10-05 HISTORY — PX: TOTAL KNEE ARTHROPLASTY: SHX125

## 2023-10-05 SURGERY — ARTHROPLASTY, KNEE, TOTAL
Anesthesia: Monitor Anesthesia Care | Site: Knee | Laterality: Left

## 2023-10-05 MED ORDER — TRANEXAMIC ACID 1000 MG/10ML IV SOLN
INTRAVENOUS | Status: DC | PRN
Start: 2023-10-05 — End: 2023-10-05
  Administered 2023-10-05: 2000 mg via TOPICAL

## 2023-10-05 MED ORDER — CHLORHEXIDINE GLUCONATE 0.12 % MT SOLN
15.0000 mL | Freq: Once | OROMUCOSAL | Status: AC
  Administered 2023-10-05: 15 mL via OROMUCOSAL

## 2023-10-05 MED ORDER — CEFAZOLIN SODIUM-DEXTROSE 2-4 GM/100ML-% IV SOLN
2.0000 g | INTRAVENOUS | Status: AC
  Administered 2023-10-05: 2 g via INTRAVENOUS
  Filled 2023-10-05: qty 100

## 2023-10-05 MED ORDER — ACETAMINOPHEN 500 MG PO TABS
500.0000 mg | ORAL_TABLET | Freq: Four times a day (QID) | ORAL | Status: AC
  Administered 2023-10-05 – 2023-10-06 (×4): 500 mg via ORAL
  Filled 2023-10-05 (×4): qty 1

## 2023-10-05 MED ORDER — ACETAMINOPHEN 10 MG/ML IV SOLN: 1000.0000 mg | Freq: Once | INTRAVENOUS | Status: DC | PRN

## 2023-10-05 MED ORDER — ROPIVACAINE HCL 7.5 MG/ML IJ SOLN
INTRAMUSCULAR | Status: DC | PRN
  Administered 2023-10-05: 20 mL via PERINEURAL

## 2023-10-05 MED ORDER — TIZANIDINE HCL 2 MG PO TABS: 2.0000 mg | ORAL_TABLET | Freq: Four times a day (QID) | ORAL | 1 refills | Status: AC | PRN

## 2023-10-05 MED ORDER — ASPIRIN 81 MG PO CHEW
81.0000 mg | CHEWABLE_TABLET | Freq: Two times a day (BID) | ORAL | Status: DC
  Administered 2023-10-06 – 2023-10-07 (×3): 81 mg via ORAL
  Filled 2023-10-05 (×3): qty 1

## 2023-10-05 MED ORDER — HYDROCODONE-ACETAMINOPHEN 5-325 MG PO TABS: 1.0000 | ORAL_TABLET | Freq: Four times a day (QID) | ORAL | 0 refills | Status: AC | PRN

## 2023-10-05 MED ORDER — LIDOCAINE HCL (PF) 2 % IJ SOLN
INTRAMUSCULAR | Status: AC
  Filled 2023-10-05: qty 5

## 2023-10-05 MED ORDER — FENTANYL CITRATE PF 50 MCG/ML IJ SOSY: 25.0000 ug | PREFILLED_SYRINGE | INTRAMUSCULAR | Status: DC | PRN

## 2023-10-05 MED ORDER — DIPHENHYDRAMINE HCL 12.5 MG/5ML PO ELIX: 12.5000 mg | ORAL_SOLUTION | ORAL | Status: DC | PRN

## 2023-10-05 MED ORDER — TRAZODONE HCL 100 MG PO TABS: 100.0000 mg | ORAL_TABLET | Freq: Every evening | ORAL | Status: DC | PRN

## 2023-10-05 MED ORDER — METHOCARBAMOL 1000 MG/10ML IJ SOLN: 500.0000 mg | Freq: Four times a day (QID) | INTRAMUSCULAR | Status: DC | PRN

## 2023-10-05 MED ORDER — ONDANSETRON HCL 4 MG/2ML IJ SOLN
4.0000 mg | Freq: Four times a day (QID) | INTRAMUSCULAR | Status: DC | PRN
  Administered 2023-10-05: 4 mg via INTRAVENOUS
  Filled 2023-10-05: qty 2

## 2023-10-05 MED ORDER — FENTANYL CITRATE (PF) 100 MCG/2ML IJ SOLN
INTRAMUSCULAR | Status: AC
  Filled 2023-10-05: qty 2

## 2023-10-05 MED ORDER — PHENOL 1.4 % MT LIQD: 1.0000 | OROMUCOSAL | Status: DC | PRN

## 2023-10-05 MED ORDER — DEXAMETHASONE SODIUM PHOSPHATE 10 MG/ML IJ SOLN
INTRAMUSCULAR | Status: DC | PRN
  Administered 2023-10-05: 10 mg

## 2023-10-05 MED ORDER — ORAL CARE MOUTH RINSE: 15.0000 mL | Freq: Once | OROMUCOSAL | Status: AC

## 2023-10-05 MED ORDER — BUPIVACAINE LIPOSOME 1.3 % IJ SUSP: 20.0000 mL | Freq: Once | INTRAMUSCULAR | Status: DC

## 2023-10-05 MED ORDER — PHENYLEPHRINE HCL-NACL 20-0.9 MG/250ML-% IV SOLN
INTRAVENOUS | Status: DC | PRN
  Administered 2023-10-05: 25 ug/min via INTRAVENOUS

## 2023-10-05 MED ORDER — PROPOFOL 500 MG/50ML IV EMUL
INTRAVENOUS | Status: DC | PRN
  Administered 2023-10-05: 75 ug/kg/min via INTRAVENOUS

## 2023-10-05 MED ORDER — PROPOFOL 10 MG/ML IV BOLUS
INTRAVENOUS | Status: DC | PRN
  Administered 2023-10-05: 20 mg via INTRAVENOUS

## 2023-10-05 MED ORDER — FENTANYL CITRATE (PF) 100 MCG/2ML IJ SOLN
INTRAMUSCULAR | Status: DC | PRN
  Administered 2023-10-05 (×2): 50 ug via INTRAVENOUS

## 2023-10-05 MED ORDER — LACTATED RINGERS IV SOLN: INTRAVENOUS | Status: DC

## 2023-10-05 MED ORDER — BUPIVACAINE-EPINEPHRINE (PF) 0.5% -1:200000 IJ SOLN
INTRAMUSCULAR | Status: AC
  Filled 2023-10-05: qty 30

## 2023-10-05 MED ORDER — 0.9 % SODIUM CHLORIDE (POUR BTL) OPTIME
TOPICAL | Status: DC | PRN
  Administered 2023-10-05: 1000 mL

## 2023-10-05 MED ORDER — PROPOFOL 1000 MG/100ML IV EMUL
INTRAVENOUS | Status: AC
  Filled 2023-10-05: qty 100

## 2023-10-05 MED ORDER — MORPHINE SULFATE (PF) 2 MG/ML IV SOLN
0.5000 mg | INTRAVENOUS | Status: DC | PRN
  Administered 2023-10-05: 0.5 mg via INTRAVENOUS
  Filled 2023-10-05: qty 1

## 2023-10-05 MED ORDER — MENTHOL 3 MG MT LOZG: 1.0000 | LOZENGE | OROMUCOSAL | Status: DC | PRN

## 2023-10-05 MED ORDER — CEFAZOLIN SODIUM-DEXTROSE 2-4 GM/100ML-% IV SOLN
2.0000 g | Freq: Four times a day (QID) | INTRAVENOUS | Status: AC
  Administered 2023-10-05 (×2): 2 g via INTRAVENOUS
  Filled 2023-10-05 (×3): qty 100

## 2023-10-05 MED ORDER — METHOCARBAMOL 500 MG PO TABS
500.0000 mg | ORAL_TABLET | Freq: Four times a day (QID) | ORAL | Status: DC | PRN
  Administered 2023-10-05: 500 mg via ORAL
  Filled 2023-10-05: qty 1

## 2023-10-05 MED ORDER — HYDROCODONE-ACETAMINOPHEN 5-325 MG PO TABS: 1.0000 | ORAL_TABLET | ORAL | Status: DC | PRN

## 2023-10-05 MED ORDER — KETOROLAC TROMETHAMINE 15 MG/ML IJ SOLN
7.5000 mg | Freq: Four times a day (QID) | INTRAMUSCULAR | Status: AC
  Administered 2023-10-05 – 2023-10-06 (×4): 7.5 mg via INTRAVENOUS
  Filled 2023-10-05 (×4): qty 1

## 2023-10-05 MED ORDER — ASPIRIN 81 MG PO TBEC: 81.0000 mg | DELAYED_RELEASE_TABLET | Freq: Two times a day (BID) | ORAL | 0 refills | Status: AC

## 2023-10-05 MED ORDER — SODIUM CHLORIDE 0.9 % IV SOLN
INTRAVENOUS | Status: DC | PRN
  Administered 2023-10-05: 80 mL

## 2023-10-05 MED ORDER — HYDROCODONE-ACETAMINOPHEN 7.5-325 MG PO TABS
1.0000 | ORAL_TABLET | ORAL | Status: DC | PRN
  Administered 2023-10-05: 1 via ORAL
  Administered 2023-10-05: 2 via ORAL
  Administered 2023-10-06 – 2023-10-07 (×5): 1 via ORAL
  Filled 2023-10-05 (×3): qty 1
  Filled 2023-10-05: qty 2
  Filled 2023-10-05 (×2): qty 1
  Filled 2023-10-05: qty 2
  Filled 2023-10-05: qty 1
  Filled 2023-10-05: qty 2

## 2023-10-05 MED ORDER — ONDANSETRON HCL 4 MG/2ML IJ SOLN
INTRAMUSCULAR | Status: AC
  Filled 2023-10-05: qty 2

## 2023-10-05 MED ORDER — ACETAMINOPHEN 325 MG PO TABS: 325.0000 mg | ORAL_TABLET | Freq: Four times a day (QID) | ORAL | Status: DC | PRN

## 2023-10-05 MED ORDER — BUPIVACAINE LIPOSOME 1.3 % IJ SUSP
INTRAMUSCULAR | Status: AC
  Filled 2023-10-05: qty 20

## 2023-10-05 MED ORDER — BUPIVACAINE IN DEXTROSE 0.75-8.25 % IT SOLN
INTRATHECAL | Status: DC | PRN
  Administered 2023-10-05: 1.6 mL via INTRATHECAL

## 2023-10-05 MED ORDER — TRANEXAMIC ACID-NACL 1000-0.7 MG/100ML-% IV SOLN
1000.0000 mg | INTRAVENOUS | Status: AC
  Administered 2023-10-05: 1000 mg via INTRAVENOUS
  Filled 2023-10-05: qty 100

## 2023-10-05 MED ORDER — PRAVASTATIN SODIUM 20 MG PO TABS
80.0000 mg | ORAL_TABLET | Freq: Every day | ORAL | Status: DC
  Administered 2023-10-05 – 2023-10-07 (×3): 80 mg via ORAL
  Filled 2023-10-05 (×3): qty 4

## 2023-10-05 MED ORDER — SODIUM CHLORIDE 0.9 % IR SOLN
Status: DC | PRN
  Administered 2023-10-05: 3000 mL

## 2023-10-05 MED ORDER — METOCLOPRAMIDE HCL 5 MG PO TABS: 5.0000 mg | ORAL_TABLET | Freq: Three times a day (TID) | ORAL | Status: DC | PRN

## 2023-10-05 MED ORDER — METOCLOPRAMIDE HCL 5 MG/ML IJ SOLN: 5.0000 mg | Freq: Three times a day (TID) | INTRAMUSCULAR | Status: DC | PRN

## 2023-10-05 MED ORDER — SODIUM CHLORIDE (PF) 0.9 % IJ SOLN
INTRAMUSCULAR | Status: AC
  Filled 2023-10-05: qty 50

## 2023-10-05 MED ORDER — BISACODYL 5 MG PO TBEC: 5.0000 mg | DELAYED_RELEASE_TABLET | Freq: Every day | ORAL | Status: DC | PRN

## 2023-10-05 MED ORDER — POVIDONE-IODINE 10 % EX SWAB: 2.0000 | Freq: Once | CUTANEOUS | Status: DC

## 2023-10-05 MED ORDER — TRANEXAMIC ACID-NACL 1000-0.7 MG/100ML-% IV SOLN
1000.0000 mg | Freq: Once | INTRAVENOUS | Status: AC
  Administered 2023-10-05: 1000 mg via INTRAVENOUS
  Filled 2023-10-05: qty 100

## 2023-10-05 MED ORDER — ONDANSETRON HCL 4 MG/2ML IJ SOLN
INTRAMUSCULAR | Status: DC | PRN
  Administered 2023-10-05: 4 mg via INTRAVENOUS

## 2023-10-05 MED ORDER — ALUM & MAG HYDROXIDE-SIMETH 200-200-20 MG/5ML PO SUSP: 30.0000 mL | ORAL | Status: DC | PRN

## 2023-10-05 MED ORDER — ONDANSETRON HCL 4 MG PO TABS
4.0000 mg | ORAL_TABLET | Freq: Four times a day (QID) | ORAL | Status: DC | PRN
  Administered 2023-10-06: 4 mg via ORAL
  Filled 2023-10-05: qty 1

## 2023-10-05 MED ORDER — DOCUSATE SODIUM 100 MG PO CAPS
100.0000 mg | ORAL_CAPSULE | Freq: Two times a day (BID) | ORAL | Status: DC
  Administered 2023-10-05 – 2023-10-07 (×5): 100 mg via ORAL
  Filled 2023-10-05 (×5): qty 1

## 2023-10-05 MED ORDER — LIDOCAINE HCL (PF) 2 % IJ SOLN
INTRAMUSCULAR | Status: DC | PRN
Start: 2023-10-05 — End: 2023-10-05
  Administered 2023-10-05: 40 mg via INTRADERMAL

## 2023-10-05 SURGICAL SUPPLY — 48 items
ATTUNE MED DOME PAT 32 KNEE (Knees) IMPLANT
ATTUNE PSFEM LTSZ3 NARCEM KNEE (Femur) IMPLANT
ATTUNE PSRP INSR SZ3 6 KNEE (Insert) IMPLANT
BAG COUNTER SPONGE SURGICOUNT (BAG) ×1 IMPLANT
BAG DECANTER FOR FLEXI CONT (MISCELLANEOUS) ×1 IMPLANT
BAG ZIPLOCK 12X15 (MISCELLANEOUS) ×1 IMPLANT
BASE TIBIAL ROT PLAT SZ 3 KNEE (Knees) IMPLANT
BLADE SAGITTAL 25.0X1.19X90 (BLADE) ×1 IMPLANT
BLADE SAW SGTL 11.0X1.19X90.0M (BLADE) ×1 IMPLANT
BLADE SURG SZ10 CARB STEEL (BLADE) ×1 IMPLANT
BNDG ELASTIC 6INX 5YD STR LF (GAUZE/BANDAGES/DRESSINGS) ×1 IMPLANT
BOOTIES KNEE HIGH SLOAN (MISCELLANEOUS) ×1 IMPLANT
BOWL SMART MIX CTS (DISPOSABLE) ×1 IMPLANT
CEMENT HV SMART SET (Cement) ×2 IMPLANT
COVER SURGICAL LIGHT HANDLE (MISCELLANEOUS) ×1 IMPLANT
CUFF TRNQT CYL 34X4.125X (TOURNIQUET CUFF) ×1 IMPLANT
DRAPE TOP 10253 STERILE (DRAPES) ×1 IMPLANT
DRAPE U-SHAPE 47X51 STRL (DRAPES) ×1 IMPLANT
DRSG AQUACEL AG ADV 3.5X10 (GAUZE/BANDAGES/DRESSINGS) ×1 IMPLANT
DURAPREP 26ML APPLICATOR (WOUND CARE) ×2 IMPLANT
ELECT PENCIL ROCKER SW 15FT (MISCELLANEOUS) ×1 IMPLANT
ELECT REM PT RETURN 15FT ADLT (MISCELLANEOUS) ×1 IMPLANT
GLOVE BIO SURGEON STRL SZ8 (GLOVE) ×2 IMPLANT
GLOVE BIOGEL PI IND STRL 7.0 (GLOVE) ×1 IMPLANT
GLOVE BIOGEL PI IND STRL 8 (GLOVE) ×2 IMPLANT
GLOVE SURG SYN 7.0 (GLOVE) ×1 IMPLANT
GLOVE SURG SYN 7.0 PF PI (GLOVE) ×1 IMPLANT
GOWN SRG XL LVL 4 BRTHBL STRL (GOWNS) ×1 IMPLANT
GOWN STRL REUS W/ TWL XL LVL3 (GOWN DISPOSABLE) ×2 IMPLANT
HOLDER FOLEY CATH W/STRAP (MISCELLANEOUS) IMPLANT
HOOD PEEL AWAY T7 (MISCELLANEOUS) ×3 IMPLANT
KIT TURNOVER KIT A (KITS) IMPLANT
MANIFOLD NEPTUNE II (INSTRUMENTS) ×1 IMPLANT
NS IRRIG 1000ML POUR BTL (IV SOLUTION) ×1 IMPLANT
PACK TOTAL KNEE CUSTOM (KITS) ×1 IMPLANT
PAD ARMBOARD POSITIONER FOAM (MISCELLANEOUS) ×1 IMPLANT
PIN STEINMAN FIXATION KNEE (PIN) IMPLANT
PROTECTOR NERVE ULNAR (MISCELLANEOUS) ×1 IMPLANT
SET HNDPC FAN SPRY TIP SCT (DISPOSABLE) ×1 IMPLANT
SUT ETHIBOND NAB CT1 #1 30IN (SUTURE) ×1 IMPLANT
SUT VIC AB 0 CT1 36 (SUTURE) ×2 IMPLANT
SUT VIC AB 2-0 CT1 TAPERPNT 27 (SUTURE) ×1 IMPLANT
SUT VICRYL AB 3-0 FS1 BRD 27IN (SUTURE) ×1 IMPLANT
SUTURE STRATFX 0 PDS 27 VIOLET (SUTURE) ×1 IMPLANT
TRAY FOLEY MTR SLVR 14FR STAT (SET/KITS/TRAYS/PACK) IMPLANT
WATER STERILE IRR 1000ML POUR (IV SOLUTION) ×2 IMPLANT
WRAP KNEE MAXI GEL POST OP (GAUZE/BANDAGES/DRESSINGS) ×1 IMPLANT
YANKAUER SUCT BULB TIP NO VENT (SUCTIONS) ×1 IMPLANT

## 2023-10-05 NOTE — Interval H&P Note (Signed)
 History and Physical Interval Note:  10/05/2023 7:30 AM  Alexis Kramer  has presented today for surgery, with the diagnosis of LEFT KNEE DEGENERATIVE JOINT DISEASE.  The various methods of treatment have been discussed with the patient and family. After consideration of risks, benefits and other options for treatment, the patient has consented to  Procedure(s) with comments: ARTHROPLASTY, KNEE, TOTAL (Left) - LEFT TOTAL KNEE ARTHROPLASTY as a surgical intervention.  The patient's history has been reviewed, patient examined, no change in status, stable for surgery.  I have reviewed the patient's chart and labs.  Questions were answered to the patient's satisfaction.     Mikyla Schachter G Maryfrances Portugal

## 2023-10-05 NOTE — Anesthesia Procedure Notes (Signed)
 Procedure Name: MAC Date/Time: 10/05/2023 7:36 AM  Performed by: Jenene Miser, CRNAPre-anesthesia Checklist: Patient identified, Emergency Drugs available, Suction available and Patient being monitored Oxygen Delivery Method: Simple face mask

## 2023-10-05 NOTE — Op Note (Signed)
 PREOP DIAGNOSIS: DJD LEFT KNEE POSTOP DIAGNOSIS:  same PROCEDURE: LEFT TKR ANESTHESIA: Spinal and MAC ATTENDING SURGEON: Alphonzo Ask ASSISTANT: Lucrezia Sachs PA  INDICATIONS FOR PROCEDURE: Alexis Kramer is a 83 y.o. female who has struggled for a long time with pain due to degenerative arthritis of the left knee.  The patient has failed many conservative non-operative measures and at this point has pain which limits the ability to sleep and walk.  The patient is offered total knee replacement.  Informed operative consent was obtained after discussion of possible risks of anesthesia, infection, neurovascular injury, DVT, and death.  The importance of the post-operative rehabilitation protocol to optimize result was stressed extensively with the patient.  SUMMARY OF FINDINGS AND PROCEDURE:  Alexis Kramer was taken to the operative suite where under the above anesthesia a left knee replacement was performed.  There were advanced degenerative changes and the bone quality was good.  We used the DePuyAttune system and placed size 3 narrow femur, 3 tibia, 32 mm all polyethylene patella, and a size 6 mm spacer.  Lucrezia Sachs PA-C assisted throughout and was invaluable to the completion of the case in that he helped retract and maintain exposure while I placed the components.  He also helped close thereby minimizing OR time.  The patient was admitted for appropriate post-op care to include perioperative antibiotics and mechanical and pharmacologic measures for DVT prophylaxis.  DESCRIPTION OF PROCEDURE:  Alexis Kramer was taken to the operative suite where the above anesthesia was applied.  The patient was positioned supine and prepped and draped in normal sterile fashion.  An appropriate time out was performed.  After the administration of kefzol  pre-op antibiotic the leg was elevated and exsanguinated and a tourniquet inflated.  A standard longitudinal incision was made on the anterior knee.   Dissection was carried down to the extensor mechanism.  All appropriate anti-infective measures were used including the pre-operative antibiotic, betadine  impregnated drape, and closed hooded exhaust systems for each member of the surgical team.  A medial parapatellar incision was made in the extensor mechanism and the knee cap flipped and the knee flexed.  Some residual meniscal tissues were removed along with any remaining ACL/PCL tissue.  A guide was placed on the tibia and a flat cut was made on it's superior surface.  An intramedullary guide was placed in the femur and was utilized to make anterior and posterior cuts creating an appropriate flexion gap.  A second intramedullary guide was placed in the femur to make a distal cut properly balancing the knee with an extension gap equal to the flexion gap.  The three bones sized to the above mentioned sizes and the appropriate guides were placed and utilized.  A trial reduction was done and the knee easily came to full extension and the patella tracked well on flexion.  The trial components were removed and all bones were cleaned with pulsatile lavage and then dried thoroughly.  Cement was mixed and was pressurized onto the bones followed by placement of the aforementioned components.  Excess cement was trimmed and pressure was held on the components until the cement had hardened.  The tourniquet was deflated and a small amount of bleeding was controlled with cautery and pressure.  The knee was irrigated thoroughly.  The extensor mechanism was re-approximated with #1 ethibond in interrupted fashion.  The knee was flexed and the repair was solid.  The subcutaneous tissues were re-approximated with #0 and #2-0 vicryl and the  skin closed with a subcuticular stitch and steristrips.  A sterile dressing was applied.  Intraoperative fluids, EBL, and tourniquet time can be obtained from anesthesia records.  DISPOSITION:  The patient was taken to recovery room in stable  condition and scheduled to potentially go home same day depending on ability to walk and tolerate liquids.  Alexis Kramer 10/05/2023, 9:09 AM

## 2023-10-05 NOTE — Evaluation (Signed)
 Physical Therapy Evaluation Patient Details Name: PEGGE NIPPLE MRN: [id number] DOB: 1941/05/01 Today's Date: 10/05/2023  History of Present Illness  Ms SUEKO SHAFI is a 83 y.o. female s/p L TKA 10/05/23 PHMx: OA both knees, breast CA (L), anxiety, GERD.  Clinical Impression  Pt is s/p TKA resulting in the deficits listed below (see PT Problem List). Pt able to come to sit EOB with CGA (HHA) for scooting and trunk positioning. Able to maintain balance without physical assist. STS CGA and bed>chair minA with 2WW. Pt reports inc pain at rest and with activity, will continue to monitor and progress mobility. Will follow up. Pt will benefit from acute skilled PT to increase their independence and safety with mobility to allow discharge.          If plan is discharge home, recommend the following: A little help with walking and/or transfers;A little help with bathing/dressing/bathroom;Assistance with cooking/housework;Assist for transportation   Can travel by private vehicle        Equipment Recommendations None recommended by PT  Recommendations for Other Services       Functional Status Assessment Patient has had a recent decline in their functional status and demonstrates the ability to make significant improvements in function in a reasonable and predictable amount of time.     Precautions / Restrictions Precautions Precautions: Fall Recall of Precautions/Restrictions: Intact Required Braces or Orthoses: Knee Immobilizer - Left Knee Immobilizer - Left: On when out of bed or walking Restrictions Weight Bearing Restrictions Per Provider Order: Yes LLE Weight Bearing Per Provider Order: Weight bearing as tolerated      Mobility  Bed Mobility Overal bed mobility: Needs Assistance Bed Mobility: Supine to Sit     Supine to sit: Contact guard     General bed mobility comments: inc time and HR use, HHA to come to sit    Transfers Overall transfer level: Needs  assistance Equipment used: Rolling walker (2 wheels) Transfers: Sit to/from Stand, Bed to chair/wheelchair/BSC Sit to Stand: Contact guard assist   Step pivot transfers: Min assist            Ambulation/Gait                  Stairs            Wheelchair Mobility     Tilt Bed    Modified Rankin (Stroke Patients Only)       Balance Overall balance assessment: Needs assistance Sitting-balance support: Feet supported, No upper extremity supported Sitting balance-Leahy Scale: Fair     Standing balance support: Bilateral upper extremity supported, During functional activity, Reliant on assistive device for balance Standing balance-Leahy Scale: Poor                               Pertinent Vitals/Pain Pain Assessment Pain Assessment: 0-10 Pain Score: 10-Worst pain ever Pain Location: L knee Pain Descriptors / Indicators: Operative site guarding, Constant, Grimacing, Sharp Pain Intervention(s): Limited activity within patient's tolerance, Monitored during session    Home Living Family/patient expects to be discharged to:: Private residence Living Arrangements: Alone Available Help at Discharge: Family Type of Home: House Home Access: Level entry       Home Layout: One level Home Equipment: Cane - single point;Shower seat;Rolling Walker (2 wheels);BSC/3in1;Grab bars - tub/shower      Prior Function Prior Level of Function : Independent/Modified Independent  Extremity/Trunk Assessment             Cervical / Trunk Assessment Cervical / Trunk Assessment: Normal  Communication   Communication Communication: No apparent difficulties    Cognition Arousal: Alert Behavior During Therapy: WFL for tasks assessed/performed   PT - Cognitive impairments: No apparent impairments                         Following commands: Intact       Cueing Cueing Techniques: Verbal cues     General  Comments      Exercises Total Joint Exercises Ankle Circles/Pumps: AROM, 20 reps, Both, Seated   Assessment/Plan    PT Assessment Patient needs continued PT services  PT Problem List Decreased strength;Decreased activity tolerance;Decreased mobility;Decreased range of motion;Decreased balance       PT Treatment Interventions DME instruction;Functional mobility training;Balance training;Patient/family education;Gait training;Therapeutic activities;Therapeutic exercise    PT Goals (Current goals can be found in the Care Plan section)  Acute Rehab PT Goals Patient Stated Goal: decrease pain in L knee PT Goal Formulation: With patient Time For Goal Achievement: 10/19/23 Potential to Achieve Goals: Good    Frequency Min 5X/week     Co-evaluation               AM-PAC PT "6 Clicks" Mobility  Outcome Measure Help needed turning from your back to your side while in a flat bed without using bedrails?: A Little Help needed moving from lying on your back to sitting on the side of a flat bed without using bedrails?: A Little Help needed moving to and from a bed to a chair (including a wheelchair)?: A Little Help needed standing up from a chair using your arms (e.g., wheelchair or bedside chair)?: A Little Help needed to walk in hospital room?: A Lot Help needed climbing 3-5 steps with a railing? : A Lot 6 Click Score: 16    End of Session Equipment Utilized During Treatment: Gait belt;Left knee immobilizer Activity Tolerance: Patient tolerated treatment well;Patient limited by pain Patient left: in chair;with call bell/phone within reach;with chair alarm set;with family/visitor present Nurse Communication: Mobility status PT Visit Diagnosis: Difficulty in walking, not elsewhere classified (R26.2)    Time: 1205-1220 PT Time Calculation (min) (ACUTE ONLY): 15 min   Charges:   PT Evaluation $PT Eval Low Complexity: 1 Low   PT General Charges $$ ACUTE PT VISIT: 1 Visit          Darien Eden PT Acute Rehabilitation Services Office: [phone] 10/05/2023   Serafin Dames 10/05/2023, 12:31 PM

## 2023-10-05 NOTE — Transfer of Care (Signed)
 Immediate Anesthesia Transfer of Care Note  Patient: Alexis Kramer  Procedure(s) Performed: ARTHROPLASTY, KNEE, TOTAL (Left: Knee)  Patient Location: PACU  Anesthesia Type:MAC and Spinal  Level of Consciousness: awake, alert , and oriented  Airway & Oxygen Therapy: Patient Spontanous Breathing and Patient connected to face mask oxygen  Post-op Assessment: Report given to RN and Post -op Vital signs reviewed and stable  Post vital signs: Reviewed and stable  Last Vitals:  Vitals Value Taken Time  BP 119/65 10/05/23 0932  Temp    Pulse 71 10/05/23 0933  Resp 13 10/05/23 0933  SpO2 100 % 10/05/23 0933  Vitals shown include unfiled device data.  Last Pain:  Vitals:   10/05/23 0559  TempSrc: Oral  PainSc:          Complications: No notable events documented.

## 2023-10-05 NOTE — Anesthesia Procedure Notes (Signed)
 Anesthesia Regional Block: Adductor canal block   Pre-Anesthetic Checklist: , timeout performed,  Correct Patient, Correct Site, Correct Laterality,  Correct Procedure, Correct Position, site marked,  Risks and benefits discussed,  Surgical consent,  Pre-op evaluation,  At surgeon's request and post-op pain management  Laterality: Left  Prep: Dura Prep       Needles:  Injection technique: Single-shot  Needle Type: Echogenic Stimulator Needle     Needle Length: 10cm  Needle Gauge: 20     Additional Needles:   Procedures:,,,, ultrasound used (permanent image in chart),,    Narrative:  Start time: 10/05/2023 6:55 AM End time: 10/05/2023 7:00 AM Injection made incrementally with aspirations every 5 mL.  Performed by: Personally  Anesthesiologist: Micheal Agent, DO  Additional Notes: Patient identified. Risks/Benefits/Options discussed with patient including but not limited to bleeding, infection, nerve damage, failed block, incomplete pain control. Patient expressed understanding and wished to proceed. All questions were answered. Sterile technique was used throughout the entire procedure. Please see nursing notes for vital signs. Aspirated in 5cc intervals with injection for negative confirmation. Patient was given instructions on fall risk and not to get out of bed. All questions and concerns addressed with instructions to call with any issues or inadequate analgesia.

## 2023-10-05 NOTE — Anesthesia Procedure Notes (Signed)
 Spinal  Patient location during procedure: OR End time: 10/05/2023 7:44 AM Reason for block: surgical anesthesia Staffing Performed: resident/CRNA  Anesthesiologist: Micheal Agent, DO Resident/CRNA: Jenene Miser, CRNA Performed by: Jenene Miser, CRNA Authorized by: Micheal Agent, DO   Preanesthetic Checklist Completed: patient identified, IV checked, site marked, risks and benefits discussed, surgical consent, monitors and equipment checked, pre-op evaluation and timeout performed Spinal Block Patient position: sitting Prep: DuraPrep Patient monitoring: heart rate, cardiac monitor, continuous pulse ox and blood pressure Approach: midline Location: L3-4 Injection technique: single-shot Needle Needle type: Pencan  Needle gauge: 24 G Needle length: 10 cm Assessment Sensory level: T4 Events: CSF return Additional Notes IV functioning, monitors applied to pt. Expiration date of kit checked and confirmed to be in date. Sterile prep and drape, hand hygiene and sterile gloved used. Pt was positioned and spine was prepped in sterile fashion. Skin was anesthetized with lidocaine . Free flow of clear CSF obtained prior to injecting local anesthetic into CSF x 1 attempt. Spinal needle aspirated freely following injection. Needle was carefully withdrawn, and pt tolerated procedure well. Loss of motor and sensory on exam post injection.

## 2023-10-05 NOTE — Anesthesia Postprocedure Evaluation (Signed)
 Anesthesia Post Note  Patient: Alexis Kramer  Procedure(s) Performed: ARTHROPLASTY, KNEE, TOTAL (Left: Knee)     Patient location during evaluation: PACU Anesthesia Type: Regional, MAC and Spinal Level of consciousness: awake and alert Pain management: pain level controlled Vital Signs Assessment: post-procedure vital signs reviewed and stable Respiratory status: spontaneous breathing, nonlabored ventilation, respiratory function stable and patient connected to nasal cannula oxygen Cardiovascular status: stable and blood pressure returned to baseline Postop Assessment: no apparent nausea or vomiting Anesthetic complications: no   No notable events documented.  Last Vitals:  Vitals:   10/05/23 1015 10/05/23 1041  BP: 127/74 (!) 141/75  Pulse: 64 62  Resp: 14 20  Temp:  (!) 36.3 C  SpO2: 100% 97%    Last Pain:  Vitals:   10/05/23 1107  TempSrc:   PainSc: 8                  Jacolby Risby P Leoni Goodness

## 2023-10-06 ENCOUNTER — Encounter (HOSPITAL_COMMUNITY): Payer: Self-pay | Admitting: Orthopaedic Surgery

## 2023-10-06 DIAGNOSIS — Z853 Personal history of malignant neoplasm of breast: Secondary | ICD-10-CM | POA: Diagnosis not present

## 2023-10-06 DIAGNOSIS — Z96651 Presence of right artificial knee joint: Secondary | ICD-10-CM | POA: Diagnosis not present

## 2023-10-06 DIAGNOSIS — Z79899 Other long term (current) drug therapy: Secondary | ICD-10-CM | POA: Diagnosis not present

## 2023-10-06 DIAGNOSIS — M1712 Unilateral primary osteoarthritis, left knee: Secondary | ICD-10-CM | POA: Diagnosis not present

## 2023-10-06 NOTE — Progress Notes (Signed)
 Subjective: 1 Day Post-Op Procedure(s) (LRB): ARTHROPLASTY, KNEE, TOTAL (Left)  Patient feels great this morning. Less pain that last knee surgery. She is hoping to go home today.  Activity level:  wbat Diet tolerance:  ok Voiding:  ok Patient reports pain as mild.    Objective: Vital signs in last 24 hours: Temp:  [97.4 F (36.3 C)-97.8 F (36.6 C)] 97.8 F (36.6 C) (04/30 0600) Pulse Rate:  [58-71] 60 (04/30 0600) Resp:  [13-20] 18 (04/30 0600) BP: (105-141)/(55-75) 117/58 (04/30 0600) SpO2:  [97 %-100 %] 99 % (04/30 0600)  Labs: No results for input(s): "HGB" in the last 72 hours. No results for input(s): "WBC", "RBC", "HCT", "PLT" in the last 72 hours. No results for input(s): "NA", "K", "CL", "CO2", "BUN", "CREATININE", "GLUCOSE", "CALCIUM" in the last 72 hours. No results for input(s): "LABPT", "INR" in the last 72 hours.  Physical Exam:  Neurologically intact ABD soft Neurovascular intact Sensation intact distally Intact pulses distally Dorsiflexion/Plantar flexion intact Incision: dressing C/D/I and no drainage No cellulitis present Compartment soft  Assessment/Plan:  1 Day Post-Op Procedure(s) (LRB): ARTHROPLASTY, KNEE, TOTAL (Left) Advance diet Up with therapy Discharge home today if cleared by PT and still doing well. Continue on 81mg  asa BID for 2 weeks then once a day for 2 weeks for DVT prevention. Follow up in office 2 weeks post op.    Alexis Kramer 10/06/2023, 8:18 AM

## 2023-10-06 NOTE — Progress Notes (Signed)
 Physical Therapy Treatment Patient Details Name: Alexis Kramer MRN: [id number] DOB: 04/22/1941 Today's Date: 10/06/2023   History of Present Illness [name] is a 83 y.o. female s/p L TKA 10/05/23 PHMx: OA both knees, breast CA (L), anxiety, GERD.    PT Comments  Patient  resting in recliner. Reports 8/10 pain, medication given 1 hour prior. Patient did ambulate a little farther, reports feeling woozy.  Patient  now reports that her caregiver for tonight is not available so patient would be home alone.  Recommend delay DC until tomorrow in hopes pain control improved and patie t will have necessary caregivers for safe DC home. RN aware.      If plan is discharge home, recommend the following: A little help with walking and/or transfers;A little help with bathing/dressing/bathroom;Assistance with cooking/housework;Assist for transportation   Can travel by private vehicle        Equipment Recommendations  None recommended by PT    Recommendations for Other Services       Precautions / Restrictions Precautions Precautions: Fall;Knee Recall of Precautions/Restrictions: Intact Precaution/Restrictions Comments: did not use KI Knee Immobilizer - Left: Discontinue once straight leg raise with < 10 degree lag     Mobility  Bed Mobility               General bed mobility comments: in chair    Transfers Overall transfer level: Needs assistance Equipment used: Rolling walker (2 wheels) Transfers: Sit to/from Stand Sit to Stand: Contact guard assist           General transfer comment: cues for hand placement    Ambulation/Gait Ambulation/Gait assistance: Contact guard assist Gait Distance (Feet): 60 Feet Assistive device: Rolling walker (2 wheels) Gait Pattern/deviations: Step-to pattern, Decreased weight shift to left, Antalgic Gait velocity: decreased     General Gait Details: antalgic,   Stairs             Wheelchair Mobility     Tilt  Bed    Modified Rankin (Stroke Patients Only)       Balance   Sitting-balance support: Feet supported, No upper extremity supported Sitting balance-Leahy Scale: Good     Standing balance support: Bilateral upper extremity supported, Reliant on assistive device for balance Standing balance-Leahy Scale: Fair                              Communication    Cognition Arousal: Alert Behavior During Therapy: WFL for tasks assessed/performed   PT - Cognitive impairments: No apparent impairments                                Cueing    Exercises      General Comments        Pertinent Vitals/Pain Pain Assessment Pain Score: 8  Pain Location: L knee Pain Descriptors / Indicators: Operative site guarding, Constant, Grimacing Pain Intervention(s): Ice applied, Premedicated before session    Home Living                          Prior Function            PT Goals (current goals can now be found in the care plan section) Progress towards PT goals: Progressing toward goals    Frequency    7X/week      PT Plan  Co-evaluation              AM-PAC PT "6 Clicks" Mobility   Outcome Measure  Help needed turning from your back to your side while in a flat bed without using bedrails?: A Little Help needed moving from lying on your back to sitting on the side of a flat bed without using bedrails?: A Little Help needed moving to and from a bed to a chair (including a wheelchair)?: A Little Help needed standing up from a chair using your arms (e.g., wheelchair or bedside chair)?: A Little Help needed to walk in hospital room?: A Little Help needed climbing 3-5 steps with a railing? : A Little 6 Click Score: 18    End of Session Equipment Utilized During Treatment: Gait belt Activity Tolerance: Patient tolerated treatment well;Patient limited by pain Patient left: in chair;with call bell/phone within reach;with family/visitor  present Nurse Communication: Mobility status PT Visit Diagnosis: Difficulty in walking, not elsewhere classified (R26.2);Pain Pain - Right/Left: Left Pain - part of body: Knee     Time: 1610-9604 PT Time Calculation (min) (ACUTE ONLY): 21 min  Charges:    $Gait Training: 8-22 mins $Therapeutic Exercise: 8-22 mins PT General Charges $$ ACUTE PT VISIT: 1 Visit                     Abelina Hoes PT Acute Rehabilitation Services Office [phone] Weekend pager-[phone]    Dareen Ebbing 10/06/2023, 3:31 PM

## 2023-10-06 NOTE — Progress Notes (Signed)
 Physical Therapy Treatment Patient Details Name: Alexis Kramer MRN: [id number] DOB: Feb 17, 1941 Today's Date: 10/06/2023   History of Present Illness [name] is a 83 y.o. female s/p L TKA 10/05/23 PHMx: OA both knees, breast CA (L), anxiety, GERD.    PT Comments  Pt with some improvement today but still limited by pain.  She ambulated 104' with RW , CGA, and antalgic pattern.  She does have good quad activation - KI not used.  Pt would benefit from further therapy prior to return home.    If plan is discharge home, recommend the following: A little help with walking and/or transfers;A little help with bathing/dressing/bathroom;Assistance with cooking/housework;Assist for transportation   Can travel by private vehicle        Equipment Recommendations  None recommended by PT    Recommendations for Other Services       Precautions / Restrictions Precautions Precautions: Fall Required Braces or Orthoses: Knee Immobilizer - Left Knee Immobilizer - Left: Discontinue once straight leg raise with < 10 degree lag Restrictions LLE Weight Bearing Per Provider Order: Weight bearing as tolerated     Mobility  Bed Mobility               General bed mobility comments: in chair    Transfers Overall transfer level: Needs assistance Equipment used: Rolling walker (2 wheels) Transfers: Sit to/from Stand Sit to Stand: Contact guard assist           General transfer comment: cues for hand placement    Ambulation/Gait Ambulation/Gait assistance: Contact guard assist Gait Distance (Feet): 40 Feet Assistive device: Rolling walker (2 wheels) Gait Pattern/deviations: Step-to pattern, Decreased weight shift to left, Antalgic Gait velocity: decreased     General Gait Details: antalgic, limited distance due to pain   Stairs             Wheelchair Mobility     Tilt Bed    Modified Rankin (Stroke Patients Only)       Balance Overall balance  assessment: Needs assistance Sitting-balance support: Feet supported, No upper extremity supported Sitting balance-Leahy Scale: Good     Standing balance support: Bilateral upper extremity supported, Reliant on assistive device for balance Standing balance-Leahy Scale: Poor                              Communication    Cognition Arousal: Alert Behavior During Therapy: WFL for tasks assessed/performed   PT - Cognitive impairments: No apparent impairments                                Cueing    Exercises Total Joint Exercises Ankle Circles/Pumps: AROM, Both, 10 reps, Supine Quad Sets: AROM, Both, 10 reps, Supine Heel Slides: AAROM, Left, 10 reps, Supine Hip ABduction/ADduction: AAROM, Left, 10 reps, Supine Long Arc Quad: AAROM, Left, 10 reps, Seated Knee Flexion: AAROM, Left, 10 reps, Seated Goniometric ROM: L knee 8 to 80    General Comments        Pertinent Vitals/Pain Pain Assessment Pain Assessment: 0-10 Pain Score: 7  Pain Location: L knee Pain Descriptors / Indicators: Operative site guarding, Constant, Grimacing Pain Intervention(s): Limited activity within patient's tolerance, Monitored during session, Premedicated before session, Repositioned, Ice applied    Home Living  Prior Function            PT Goals (current goals can now be found in the care plan section) Progress towards PT goals: Progressing toward goals    Frequency    7X/week      PT Plan      Co-evaluation              AM-PAC PT "6 Clicks" Mobility   Outcome Measure  Help needed turning from your back to your side while in a flat bed without using bedrails?: A Little Help needed moving from lying on your back to sitting on the side of a flat bed without using bedrails?: A Little Help needed moving to and from a bed to a chair (including a wheelchair)?: A Little Help needed standing up from a chair using your arms  (e.g., wheelchair or bedside chair)?: A Little Help needed to walk in hospital room?: A Little Help needed climbing 3-5 steps with a railing? : A Little 6 Click Score: 18    End of Session Equipment Utilized During Treatment: Gait belt Activity Tolerance: Patient tolerated treatment well;Patient limited by pain Patient left: in chair;with call bell/phone within reach;with family/visitor present Nurse Communication: Mobility status PT Visit Diagnosis: Difficulty in walking, not elsewhere classified (R26.2)     Time: 4696-2952 PT Time Calculation (min) (ACUTE ONLY): 24 min  Charges:    $Gait Training: 8-22 mins $Therapeutic Exercise: 8-22 mins PT General Charges $$ ACUTE PT VISIT: 1 Visit                     Cyd Dowse, PT Acute Rehab Services [facility] [phone]    Carolynn Citrin 10/06/2023, 12:04 PM

## 2023-10-06 NOTE — Discharge Summary (Addendum)
 Patient ID: Alexis Kramer MRN: [id number] DOB/AGE: 09/09/1940 83 y.o.  Admit date: 10/05/2023 Discharge date:10/07/2023  Admission Diagnoses:  Principal Problem:   Primary osteoarthritis of left knee   Discharge Diagnoses:  Same  Past Medical History:  Diagnosis Date   Anxiety    Arthritis    both knees   Breast cancer (HCC)    Cancer (HCC) 1994   breast cancer ; left side   Depression    situational   GERD (gastroesophageal reflux disease)    Pre-diabetes     Surgeries: Procedure(s): ARTHROPLASTY, KNEE, TOTAL on 10/05/2023   Consultants:   Discharged Condition: Improved  Hospital Course: Alexis Kramer is an 83 y.o. female who was admitted 10/05/2023 for operative treatment ofPrimary osteoarthritis of left knee. Patient has severe unremitting pain that affects sleep, daily activities, and work/hobbies. After pre-op clearance the patient was taken to the operating room on 10/05/2023 and underwent  Procedure(s): ARTHROPLASTY, KNEE, TOTAL.    Patient was given perioperative antibiotics:  Anti-infectives (From admission, onward)    Start     Dose/Rate Route Frequency Ordered Stop   10/05/23 1330  ceFAZolin  (ANCEF ) IVPB 2g/100 mL premix        2 g 200 mL/hr over 30 Minutes Intravenous Every 6 hours 10/05/23 1038 10/05/23 1820   10/05/23 0600  ceFAZolin  (ANCEF ) IVPB 2g/100 mL premix        2 g 200 mL/hr over 30 Minutes Intravenous On call to O.R. 10/05/23 7829 10/05/23 0746        Patient was given sequential compression devices, early ambulation, and chemoprophylaxis to prevent DVT.  Patient benefited maximally from hospital stay and there were no complications.    Recent vital signs: Patient Vitals for the past 24 hrs:  BP Temp Temp src Pulse Resp SpO2  10/06/23 0600 (!) 117/58 97.8 F (36.6 C) Oral 60 18 99 %  10/06/23 0110 (!) 105/55 97.6 F (36.4 C) Oral (!) 58 18 99 %  10/05/23 2116 121/61 97.8 F (36.6 C) Oral (!) 59 18 97 %  10/05/23 1823 136/70 97.7  F (36.5 C) -- 64 18 100 %  10/05/23 1343 130/68 97.6 F (36.4 C) -- 68 18 100 %  10/05/23 1041 (!) 141/75 (!) 97.4 F (36.3 C) Oral 62 20 97 %  10/05/23 1015 127/74 -- -- 64 14 100 %  10/05/23 1000 111/60 97.6 F (36.4 C) -- 61 16 99 %  10/05/23 0945 118/67 -- -- 60 14 100 %  10/05/23 0933 119/65 (!) 97.4 F (36.3 C) -- 71 13 100 %     Recent laboratory studies: No results for input(s): "WBC", "HGB", "HCT", "PLT", "NA", "K", "CL", "CO2", "BUN", "CREATININE", "GLUCOSE", "INR", "CALCIUM" in the last 72 hours.  Invalid input(s): "PT", "2"   Discharge Medications:   Allergies as of 10/06/2023   No Known Allergies      Medication List     TAKE these medications    acetaminophen  325 MG tablet Commonly known as: TYLENOL  Take 2 tablets (650 mg total) by mouth every 4 (four) hours as needed for mild pain (pain score 1-3) (temp > 100.5).   aspirin  EC 81 MG tablet Take 1 tablet (81 mg total) by mouth 2 (two) times daily after a meal. For 2 weeks then once a day for 2 weeks for blood clot prevention.   carboxymethylcellulose 0.5 % Soln Commonly known as: REFRESH PLUS Place 1 drop into both eyes 3 (three) times daily as needed (dry eyes).  HYDROcodone -acetaminophen  5-325 MG tablet Commonly known as: NORCO/VICODIN Take 1-2 tablets by mouth every 6 (six) hours as needed for moderate pain (pain score 4-6) or severe pain (pain score 7-10) (post op pain).   oxymetazoline  0.05 % nasal spray Commonly known as: AFRIN Place 1 spray into both nostrils 2 (two) times daily as needed (epistaxis).   pravastatin  80 MG tablet Commonly known as: PRAVACHOL  Take 80 mg by mouth daily.   tiZANidine  2 MG tablet Commonly known as: ZANAFLEX  Take 1-2 tablets (2-4 mg total) by mouth every 6 (six) hours as needed for muscle spasms.   traZODone  100 MG tablet Commonly known as: DESYREL  Take 100 mg by mouth at bedtime as needed for sleep.   Vitamin D3 50 MCG (2000 UT) Tabs Take 2,000 Units by  mouth daily.               Durable Medical Equipment  (From admission, onward)           Start     Ordered   10/05/23 1039  DME Walker rolling  Once       Question:  Patient needs a walker to treat with the following condition  Answer:  Primary osteoarthritis of left knee   10/05/23 1038   10/05/23 1039  DME 3 n 1  Once        10/05/23 1038   10/05/23 1039  DME Bedside commode  Once       Question:  Patient needs a bedside commode to treat with the following condition  Answer:  Primary osteoarthritis of left knee   10/05/23 1038            Diagnostic Studies: No results found.  Disposition: Discharge disposition: 01-Home or Self Care       Discharge Instructions     Call MD / Call 911   Complete by: As directed    If you experience chest pain or shortness of breath, CALL 911 and be transported to the hospital emergency room.  If you develope a fever above 101 F, pus (white drainage) or increased drainage or redness at the wound, or calf pain, call your surgeon's office.   Constipation Prevention   Complete by: As directed    Drink plenty of fluids.  Prune juice may be helpful.  You may use a stool softener, such as Colace (over the counter) 100 mg twice a day.  Use MiraLax  (over the counter) for constipation as needed.   Diet - low sodium heart healthy   Complete by: As directed    Discharge instructions   Complete by: As directed    INSTRUCTIONS AFTER JOINT REPLACEMENT   Remove items at home which could result in a fall. This includes throw rugs or furniture in walking pathways ICE to the affected joint every three hours while awake for 30 minutes at a time, for at least the first 3-5 days, and then as needed for pain and swelling.  Continue to use ice for pain and swelling. You may notice swelling that will progress down to the foot and ankle.  This is normal after surgery.  Elevate your leg when you are not up walking on it.   Continue to use the breathing  machine you got in the hospital (incentive spirometer) which will help keep your temperature down.  It is common for your temperature to cycle up and down following surgery, especially at night when you are not up moving around and exerting yourself.  The breathing  machine keeps your lungs expanded and your temperature down.   DIET:  As you were doing prior to hospitalization, we recommend a well-balanced diet.  DRESSING / WOUND CARE / SHOWERING  You may shower 3 days after surgery, but keep the wounds dry during showering.  You may use an occlusive plastic wrap (Press'n Seal for example), NO SOAKING/SUBMERGING IN THE BATHTUB.  If the bandage gets wet, change with a clean dry gauze.  If the incision gets wet, pat the wound dry with a clean towel.  ACTIVITY  Increase activity slowly as tolerated, but follow the weight bearing instructions below.   No driving for 6 weeks or until further direction given by your physician.  You cannot drive while taking narcotics.  No lifting or carrying greater than 10 lbs. until further directed by your surgeon. Avoid periods of inactivity such as sitting longer than an hour when not asleep. This helps prevent blood clots.  You may return to work once you are authorized by your doctor.     WEIGHT BEARING   Weight bearing as tolerated with assist device (walker, cane, etc) as directed, use it as long as suggested by your surgeon or therapist, typically at least 4-6 weeks.   EXERCISES  Results after joint replacement surgery are often greatly improved when you follow the exercise, range of motion and muscle strengthening exercises prescribed by your doctor. Safety measures are also important to protect the joint from further injury. Any time any of these exercises cause you to have increased pain or swelling, decrease what you are doing until you are comfortable again and then slowly increase them. If you have problems or questions, call your caregiver or  physical therapist for advice.   Rehabilitation is important following a joint replacement. After just a few days of immobilization, the muscles of the leg can become weakened and shrink (atrophy).  These exercises are designed to build up the tone and strength of the thigh and leg muscles and to improve motion. Often times heat used for twenty to thirty minutes before working out will loosen up your tissues and help with improving the range of motion but do not use heat for the first two weeks following surgery (sometimes heat can increase post-operative swelling).   These exercises can be done on a training (exercise) mat, on the floor, on a table or on a bed. Use whatever works the best and is most comfortable for you.    Use music or television while you are exercising so that the exercises are a pleasant break in your day. This will make your life better with the exercises acting as a break in your routine that you can look forward to.   Perform all exercises about fifteen times, three times per day or as directed.  You should exercise both the operative leg and the other leg as well.  Exercises include:   Quad Sets - Tighten up the muscle on the front of the thigh (Quad) and hold for 5-10 seconds.   Straight Leg Raises - With your knee straight (if you were given a brace, keep it on), lift the leg to 60 degrees, hold for 3 seconds, and slowly lower the leg.  Perform this exercise against resistance later as your leg gets stronger.  Leg Slides: Lying on your back, slowly slide your foot toward your buttocks, bending your knee up off the floor (only go as far as is comfortable). Then slowly slide your foot back down until your  leg is flat on the floor again.  Angel Wings: Lying on your back spread your legs to the side as far apart as you can without causing discomfort.  Hamstring Strength:  Lying on your back, push your heel against the floor with your leg straight by tightening up the muscles of  your buttocks.  Repeat, but this time bend your knee to a comfortable angle, and push your heel against the floor.  You may put a pillow under the heel to make it more comfortable if necessary.   A rehabilitation program following joint replacement surgery can speed recovery and prevent re-injury in the future due to weakened muscles. Contact your doctor or a physical therapist for more information on knee rehabilitation.    CONSTIPATION  Constipation is defined medically as fewer than three stools per week and severe constipation as less than one stool per week.  Even if you have a regular bowel pattern at home, your normal regimen is likely to be disrupted due to multiple reasons following surgery.  Combination of anesthesia, postoperative narcotics, change in appetite and fluid intake all can affect your bowels.   YOU MUST use at least one of the following options; they are listed in order of increasing strength to get the job done.  They are all available over the counter, and you may need to use some, POSSIBLY even all of these options:    Drink plenty of fluids (prune juice may be helpful) and high fiber foods Colace 100 mg by mouth twice a day  Senokot for constipation as directed and as needed Dulcolax (bisacodyl ), take with full glass of water   Miralax  (polyethylene glycol) once or twice a day as needed.  If you have tried all these things and are unable to have a bowel movement in the first 3-4 days after surgery call either your surgeon or your primary doctor.    If you experience loose stools or diarrhea, hold the medications until you stool forms back up.  If your symptoms do not get better within 1 week or if they get worse, check with your doctor.  If you experience "the worst abdominal pain ever" or develop nausea or vomiting, please contact the office immediately for further recommendations for treatment.   ITCHING:  If you experience itching with your medications, try taking  only a single pain pill, or even half a pain pill at a time.  You can also use Benadryl  over the counter for itching or also to help with sleep.   TED HOSE STOCKINGS:  Use stockings on both legs until for at least 2 weeks or as directed by physician office. They may be removed at night for sleeping.  MEDICATIONS:  See your medication summary on the "After Visit Summary" that nursing will review with you.  You may have some home medications which will be placed on hold until you complete the course of blood thinner medication.  It is important for you to complete the blood thinner medication as prescribed.  PRECAUTIONS:  If you experience chest pain or shortness of breath - call 911 immediately for transfer to the hospital emergency department.   If you develop a fever greater that 101 F, purulent drainage from wound, increased redness or drainage from wound, foul odor from the wound/dressing, or calf pain - CONTACT YOUR SURGEON.  FOLLOW-UP APPOINTMENTS:  If you do not already have a post-op appointment, please call the office for an appointment to be seen by your surgeon.  Guidelines for how Kramer to be seen are listed in your "After Visit Summary", but are typically between 1-4 weeks after surgery.  OTHER INSTRUCTIONS:   Knee Replacement:  Do not place pillow under knee, focus on keeping the knee straight while resting. CPM instructions: 0-90 degrees, 2 hours in the morning, 2 hours in the afternoon, and 2 hours in the evening. Place foam block, curve side up under heel at all times except when in CPM or when walking.  DO NOT modify, tear, cut, or change the foam block in any way.  POST-OPERATIVE OPIOID TAPER INSTRUCTIONS: It is important to wean off of your opioid medication as Kramer as possible. If you do not need pain medication after your surgery it is ok to stop day one. Opioids include: Codeine, Hydrocodone (Norco, Vicodin), Oxycodone (Percocet,  oxycontin ) and hydromorphone  amongst others.  Long term and even short term use of opiods can cause: Increased pain response Dependence Constipation Depression Respiratory depression And more.  Withdrawal symptoms can include Flu like symptoms Nausea, vomiting And more Techniques to manage these symptoms Hydrate well Eat regular healthy meals Stay active Use relaxation techniques(deep breathing, meditating, yoga) Do Not substitute Alcohol  to help with tapering If you have been on opioids for less than two weeks and do not have pain than it is ok to stop all together.  Plan to wean off of opioids This plan should start within one week post op of your joint replacement. Maintain the same interval or time between taking each dose and first decrease the dose.  Cut the total daily intake of opioids by one tablet each day Next start to increase the time between doses. The last dose that should be eliminated is the evening dose.     MAKE SURE YOU:  Understand these instructions.  Get help right away if you are not doing well or get worse.    Thank you for letting us  be a part of your medical care team.  It is a privilege we respect greatly.  We hope these instructions will help you stay on track for a fast and full recovery!   Increase activity slowly as tolerated   Complete by: As directed    Post-operative opioid taper instructions:   Complete by: As directed    POST-OPERATIVE OPIOID TAPER INSTRUCTIONS: It is important to wean off of your opioid medication as Kramer as possible. If you do not need pain medication after your surgery it is ok to stop day one. Opioids include: Codeine, Hydrocodone (Norco, Vicodin), Oxycodone (Percocet, oxycontin ) and hydromorphone  amongst others.  Long term and even short term use of opiods can cause: Increased pain response Dependence Constipation Depression Respiratory depression And more.  Withdrawal symptoms can include Flu like  symptoms Nausea, vomiting And more Techniques to manage these symptoms Hydrate well Eat regular healthy meals Stay active Use relaxation techniques(deep breathing, meditating, yoga) Do Not substitute Alcohol  to help with tapering If you have been on opioids for less than two weeks and do not have pain than it is ok to stop all together.  Plan to wean off of opioids This plan should start within one week post op of your joint replacement. Maintain the same interval or time between taking each dose and first decrease the dose.  Cut the total daily intake of opioids by one tablet each day Next start to  increase the time between doses. The last dose that should be eliminated is the evening dose.           Follow-up Information     Dayne Even, MD. Go on 10/15/2023.   Specialty: Orthopedic Surgery Why: your appointment is scheduled for 11:15. Contact information: [address] [place] [address] [phone]         [facility] Follow up.   Why: HHPT will provide 6 home visits prior to starting outpatient physical therapy        [facility]- [address] Go on 10/18/2023.   Why: your outpatient physical therapy has been scheduled. they will call you with an appointment time Contact information: [phone]                 Signed: Geraldina Klinefelter Amari Burnsworth 10/06/2023, 8:20 AM

## 2023-10-06 NOTE — TOC Transition Note (Signed)
 Transition of Care [facility]) - Discharge Note   Patient Details  Name: Alexis Kramer MRN: [id number] Date of Birth: 05/02/1941  [facility] of [facility] CM/SW Contact:  Delilah Fend, LCSW Phone Number: 10/06/2023, 10:26 AM   Clinical Narrative:     Met with pt and confirming she has received RW to room via [employer].  Pt aware HHPT arranged with [facility] via ortho MD office prior to surgery.  No further TOC needs.  Final next level of care: Home w Home Health Services Barriers to Discharge: No Barriers Identified   Patient Goals and CMS Choice Patient states their goals for this hospitalization and ongoing recovery are:: return home          Discharge Placement                       Discharge Plan and Services Additional resources added to the After Visit Summary for                  DME Arranged: Walker rolling DME Agency: [employer]       HH Arranged: PT HH Agency: [facility]        Social Drivers of Health (SDOH) Interventions SDOH Screenings   Food Insecurity: No Food Insecurity (10/05/2023)  Housing: Low Risk  (10/05/2023)  Transportation Needs: No Transportation Needs (10/05/2023)  Utilities: Not At Risk (10/05/2023)  Social Connections: Moderately Integrated (10/05/2023)  Tobacco Use: Medium Risk (10/05/2023)     Readmission Risk Interventions     No data to display

## 2023-10-06 NOTE — Care Management Obs Status (Signed)
 MEDICARE OBSERVATION STATUS NOTIFICATION   Patient Details  Name: Alexis Kramer MRN: [id number] Date of Birth: 11-Feb-1941   Medicare Observation Status Notification Given:  Birda Buffy, LCSW 10/06/2023, 10:23 AM

## 2023-10-06 NOTE — Plan of Care (Signed)

## 2023-10-07 DIAGNOSIS — Z79899 Other long term (current) drug therapy: Secondary | ICD-10-CM | POA: Diagnosis not present

## 2023-10-07 DIAGNOSIS — Z853 Personal history of malignant neoplasm of breast: Secondary | ICD-10-CM | POA: Diagnosis not present

## 2023-10-07 DIAGNOSIS — M1712 Unilateral primary osteoarthritis, left knee: Secondary | ICD-10-CM | POA: Diagnosis not present

## 2023-10-07 DIAGNOSIS — Z96651 Presence of right artificial knee joint: Secondary | ICD-10-CM | POA: Diagnosis not present

## 2023-10-07 NOTE — Progress Notes (Signed)
 Subjective: 2 Days Post-Op Procedure(s) (LRB): ARTHROPLASTY, KNEE, TOTAL (Left)  Patient feeling a little better this morning. She is hoping to go home today.  Activity level:  wbat Diet tolerance:  ok Voiding:  ok Patient reports pain as mild.    Objective: Vital signs in last 24 hours: Temp:  [97.3 F (36.3 C)-98 F (36.7 C)] 97.7 F (36.5 C) (05/01 0519) Pulse Rate:  [58-61] 61 (05/01 0519) Resp:  [18-19] 18 (05/01 0519) BP: (112-130)/(54-64) 112/54 (05/01 0519) SpO2:  [96 %-100 %] 97 % (05/01 0519)  Labs: No results for input(s): "HGB" in the last 72 hours. No results for input(s): "WBC", "RBC", "HCT", "PLT" in the last 72 hours. No results for input(s): "NA", "K", "CL", "CO2", "BUN", "CREATININE", "GLUCOSE", "CALCIUM" in the last 72 hours. No results for input(s): "LABPT", "INR" in the last 72 hours.  Physical Exam:  Neurologically intact ABD soft Neurovascular intact Sensation intact distally Intact pulses distally Dorsiflexion/Plantar flexion intact Incision: dressing C/D/I and scant drainage No cellulitis present Compartment soft  Assessment/Plan:  2 Days Post-Op Procedure(s) (LRB): ARTHROPLASTY, KNEE, TOTAL (Left) Advance diet Up with therapy D/C IV fluids Discharge home with home health today after PT. Continue on 81 mg asa BID for DVT prevention. Follow up in office 2 weeks post op.    Alexis Kramer Ancheta 10/07/2023, 7:08 AM

## 2023-10-07 NOTE — Progress Notes (Signed)
 Physical Therapy Treatment Patient Details Name: Alexis Kramer MRN: [id number] DOB: Aug 23, 1940 Today's Date: 10/07/2023   History of Present Illness Alexis Kramer is a 83 y.o. female s/p L TKA 10/05/23 PHMx: OA both knees, breast CA (L), anxiety, GERD.    PT Comments  Pt tolerating progressive mobility well, able to dec level of assist during amb in hallway to sup A, cues for body placement. Pt denies questions, comments or concerns related to HEP. Pt advised to monitor symptoms for yellow flag inflammatory symptoms following activity, verbalizes understanding.     If plan is discharge home, recommend the following: A little help with walking and/or transfers;A little help with bathing/dressing/bathroom;Assistance with cooking/housework;Assist for transportation   Can travel by private vehicle        Equipment Recommendations  None recommended by PT    Recommendations for Other Services       Precautions / Restrictions Precautions Precautions: Fall;Knee Recall of Precautions/Restrictions: Intact Precaution/Restrictions Comments: did not use KI Restrictions Weight Bearing Restrictions Per Provider Order: Yes LLE Weight Bearing Per Provider Order: Weight bearing as tolerated     Mobility  Bed Mobility                    Transfers Overall transfer level: Modified independent Equipment used: Rolling walker (2 wheels) Transfers: Sit to/from Stand Sit to Stand: Modified independent (Device/Increase time)                Ambulation/Gait Ambulation/Gait assistance: Supervision Gait Distance (Feet): 60 Feet Assistive device: Rolling walker (2 wheels) Gait Pattern/deviations: Step-to pattern, Decreased weight shift to left, Antalgic Gait velocity: decreased     General Gait Details: antalgic, cues for body placement within walker   Stairs             Wheelchair Mobility     Tilt Bed    Modified Rankin (Stroke Patients Only)        Balance Overall balance assessment: Needs assistance Sitting-balance support: Feet supported, No upper extremity supported Sitting balance-Leahy Scale: Good     Standing balance support: Bilateral upper extremity supported, Reliant on assistive device for balance Standing balance-Leahy Scale: Fair                              Hotel manager: No apparent difficulties  Cognition Arousal: Alert Behavior During Therapy: WFL for tasks assessed/performed   PT - Cognitive impairments: No apparent impairments                         Following commands: Intact      Cueing Cueing Techniques: Verbal cues  Exercises      General Comments        Pertinent Vitals/Pain Pain Assessment Pain Assessment: 0-10 Pain Score: 6  Pain Location: L knee Pain Descriptors / Indicators: Operative site guarding, Constant, Grimacing Pain Intervention(s): Limited activity within patient's tolerance, Monitored during session, Repositioned, Patient requesting pain meds-RN notified    Home Living                          Prior Function            PT Goals (current goals can now be found in the care plan section) Acute Rehab PT Goals Patient Stated Goal: decrease pain in L knee PT Goal Formulation: With patient Time For Goal Achievement: 10/19/23  Potential to Achieve Goals: Good Progress towards PT goals: Progressing toward goals    Frequency    7X/week      PT Plan      Co-evaluation              AM-PAC PT "6 Clicks" Mobility   Outcome Measure  Help needed turning from your back to your side while in a flat bed without using bedrails?: A Little Help needed moving from lying on your back to sitting on the side of a flat bed without using bedrails?: A Little Help needed moving to and from a bed to a chair (including a wheelchair)?: None Help needed standing up from a chair using your arms (e.g., wheelchair or bedside  chair)?: None Help needed to walk in hospital room?: A Little Help needed climbing 3-5 steps with a railing? : A Little 6 Click Score: 20    End of Session Equipment Utilized During Treatment: Gait belt Activity Tolerance: Patient tolerated treatment well;Patient limited by pain Patient left: in chair;with call bell/phone within reach;with family/visitor present;with nursing/sitter in room Nurse Communication: Mobility status PT Visit Diagnosis: Difficulty in walking, not elsewhere classified (R26.2);Pain Pain - Right/Left: Left Pain - part of body: Knee     Time: 1610-9604 PT Time Calculation (min) (ACUTE ONLY): 14 min  Charges:    $Gait Training: 8-22 mins PT General Charges $$ ACUTE PT VISIT: 1 Visit                     Darien Eden, PT Acute Rehabilitation Services Office: [phone] 10/07/2023    Serafin Dames 10/07/2023, 9:29 AM

## 2023-10-08 DIAGNOSIS — I10 Essential (primary) hypertension: Secondary | ICD-10-CM | POA: Diagnosis not present

## 2023-10-08 DIAGNOSIS — Z471 Aftercare following joint replacement surgery: Secondary | ICD-10-CM | POA: Diagnosis not present

## 2023-10-08 DIAGNOSIS — Z96652 Presence of left artificial knee joint: Secondary | ICD-10-CM | POA: Diagnosis not present

## 2023-10-08 DIAGNOSIS — Z853 Personal history of malignant neoplasm of breast: Secondary | ICD-10-CM | POA: Diagnosis not present

## 2023-10-11 DIAGNOSIS — I10 Essential (primary) hypertension: Secondary | ICD-10-CM | POA: Diagnosis not present

## 2023-10-11 DIAGNOSIS — Z471 Aftercare following joint replacement surgery: Secondary | ICD-10-CM | POA: Diagnosis not present

## 2023-10-11 DIAGNOSIS — Z853 Personal history of malignant neoplasm of breast: Secondary | ICD-10-CM | POA: Diagnosis not present

## 2023-10-11 DIAGNOSIS — Z96652 Presence of left artificial knee joint: Secondary | ICD-10-CM | POA: Diagnosis not present

## 2023-10-13 DIAGNOSIS — Z471 Aftercare following joint replacement surgery: Secondary | ICD-10-CM | POA: Diagnosis not present

## 2023-10-13 DIAGNOSIS — Z853 Personal history of malignant neoplasm of breast: Secondary | ICD-10-CM | POA: Diagnosis not present

## 2023-10-13 DIAGNOSIS — I10 Essential (primary) hypertension: Secondary | ICD-10-CM | POA: Diagnosis not present

## 2023-10-13 DIAGNOSIS — Z96652 Presence of left artificial knee joint: Secondary | ICD-10-CM | POA: Diagnosis not present

## 2023-10-15 DIAGNOSIS — M25562 Pain in left knee: Secondary | ICD-10-CM | POA: Diagnosis not present

## 2023-10-15 DIAGNOSIS — I10 Essential (primary) hypertension: Secondary | ICD-10-CM | POA: Diagnosis not present

## 2023-10-15 DIAGNOSIS — Z853 Personal history of malignant neoplasm of breast: Secondary | ICD-10-CM | POA: Diagnosis not present

## 2023-10-15 DIAGNOSIS — Z471 Aftercare following joint replacement surgery: Secondary | ICD-10-CM | POA: Diagnosis not present

## 2023-10-15 DIAGNOSIS — Z96652 Presence of left artificial knee joint: Secondary | ICD-10-CM | POA: Diagnosis not present

## 2023-10-18 ENCOUNTER — Ambulatory Visit: Admitting: Physical Therapy

## 2023-10-18 DIAGNOSIS — Z853 Personal history of malignant neoplasm of breast: Secondary | ICD-10-CM | POA: Diagnosis not present

## 2023-10-18 DIAGNOSIS — Z471 Aftercare following joint replacement surgery: Secondary | ICD-10-CM | POA: Diagnosis not present

## 2023-10-18 DIAGNOSIS — Z96652 Presence of left artificial knee joint: Secondary | ICD-10-CM | POA: Diagnosis not present

## 2023-10-18 DIAGNOSIS — I10 Essential (primary) hypertension: Secondary | ICD-10-CM | POA: Diagnosis not present

## 2023-10-19 DIAGNOSIS — Z96652 Presence of left artificial knee joint: Secondary | ICD-10-CM | POA: Diagnosis not present

## 2023-10-19 DIAGNOSIS — R300 Dysuria: Secondary | ICD-10-CM | POA: Diagnosis not present

## 2023-10-20 DIAGNOSIS — Z853 Personal history of malignant neoplasm of breast: Secondary | ICD-10-CM | POA: Diagnosis not present

## 2023-10-20 DIAGNOSIS — I10 Essential (primary) hypertension: Secondary | ICD-10-CM | POA: Diagnosis not present

## 2023-10-20 DIAGNOSIS — Z96652 Presence of left artificial knee joint: Secondary | ICD-10-CM | POA: Diagnosis not present

## 2023-10-20 DIAGNOSIS — Z471 Aftercare following joint replacement surgery: Secondary | ICD-10-CM | POA: Diagnosis not present

## 2023-10-22 DIAGNOSIS — I10 Essential (primary) hypertension: Secondary | ICD-10-CM | POA: Diagnosis not present

## 2023-10-22 DIAGNOSIS — Z471 Aftercare following joint replacement surgery: Secondary | ICD-10-CM | POA: Diagnosis not present

## 2023-10-22 DIAGNOSIS — Z96652 Presence of left artificial knee joint: Secondary | ICD-10-CM | POA: Diagnosis not present

## 2023-10-22 DIAGNOSIS — Z853 Personal history of malignant neoplasm of breast: Secondary | ICD-10-CM | POA: Diagnosis not present

## 2023-10-25 DIAGNOSIS — Z96652 Presence of left artificial knee joint: Secondary | ICD-10-CM | POA: Diagnosis not present

## 2023-10-25 DIAGNOSIS — I10 Essential (primary) hypertension: Secondary | ICD-10-CM | POA: Diagnosis not present

## 2023-10-25 DIAGNOSIS — Z471 Aftercare following joint replacement surgery: Secondary | ICD-10-CM | POA: Diagnosis not present

## 2023-10-25 DIAGNOSIS — Z853 Personal history of malignant neoplasm of breast: Secondary | ICD-10-CM | POA: Diagnosis not present

## 2023-10-27 DIAGNOSIS — I10 Essential (primary) hypertension: Secondary | ICD-10-CM | POA: Diagnosis not present

## 2023-10-27 DIAGNOSIS — Z853 Personal history of malignant neoplasm of breast: Secondary | ICD-10-CM | POA: Diagnosis not present

## 2023-10-27 DIAGNOSIS — Z471 Aftercare following joint replacement surgery: Secondary | ICD-10-CM | POA: Diagnosis not present

## 2023-10-27 DIAGNOSIS — Z96652 Presence of left artificial knee joint: Secondary | ICD-10-CM | POA: Diagnosis not present

## 2023-10-28 ENCOUNTER — Ambulatory Visit: Attending: Orthopaedic Surgery | Admitting: Physical Therapy

## 2023-10-28 ENCOUNTER — Encounter: Payer: Self-pay | Admitting: Physical Therapy

## 2023-10-28 DIAGNOSIS — M6281 Muscle weakness (generalized): Secondary | ICD-10-CM | POA: Diagnosis not present

## 2023-10-28 DIAGNOSIS — R29898 Other symptoms and signs involving the musculoskeletal system: Secondary | ICD-10-CM | POA: Insufficient documentation

## 2023-10-28 DIAGNOSIS — M25562 Pain in left knee: Secondary | ICD-10-CM | POA: Insufficient documentation

## 2023-10-28 DIAGNOSIS — R262 Difficulty in walking, not elsewhere classified: Secondary | ICD-10-CM | POA: Insufficient documentation

## 2023-10-28 DIAGNOSIS — R2681 Unsteadiness on feet: Secondary | ICD-10-CM | POA: Insufficient documentation

## 2023-10-28 NOTE — Therapy (Signed)
 OUTPATIENT PHYSICAL THERAPY NEURO EVALUATION   Patient Name: Alexis Kramer MRN: [id number] DOB:1941/01/31, 83 y.o., female Today's Date: 10/29/2023   PCP: Victorio Grave, MD REFERRING PROVIDER: Dayne Even, MD  END OF SESSION:  PT End of Session - 10/28/23 2031     Visit Number 1    Number of Visits 17    Date for PT Re-Evaluation 12/24/23    Authorization Type [employer] Medicare    Authorization Time Period 10-28-23 - 01-06-24    PT Start Time 0932    PT Stop Time 1016    PT Time Calculation (min) 44 min    Equipment Utilized During Treatment Gait belt    Activity Tolerance Patient tolerated treatment well    Behavior During Therapy WFL for tasks assessed/performed             Past Medical History:  Diagnosis Date   Anxiety    Arthritis    both knees   Breast cancer (HCC)    Cancer (HCC) 1994   breast cancer ; left side   Depression    situational   GERD (gastroesophageal reflux disease)    Pre-diabetes    Past Surgical History:  Procedure Laterality Date   ABDOMINAL HYSTERECTOMY     APPENDECTOMY     CRANIOTOMY N/A 04/14/2023   Procedure: Endoscopic endonasal resection of pituitary tumor;  Surgeon: Cannon Champion, MD;  Location: [facility] OR;  Service: Neurosurgery;  Laterality: N/A;   EYE SURGERY Bilateral 2021   MASTECTOMY Left 1997   TONSILLECTOMY     TOTAL KNEE ARTHROPLASTY Right 09/12/2019   Procedure: RIGHT TOTAL KNEE ARTHROPLASTY;  Surgeon: Dayne Even, MD;  Location: [facility] ORS;  Service: Orthopedics;  Laterality: Right;   TOTAL KNEE ARTHROPLASTY Left 10/05/2023   Procedure: ARTHROPLASTY, KNEE, TOTAL;  Surgeon: Dayne Even, MD;  Location: [facility] ORS;  Service: Orthopedics;  Laterality: Left;  LEFT TOTAL KNEE ARTHROPLASTY   TRANSNASAL APPROACH N/A 04/14/2023   Procedure: TRANSNASAL APPROACH;  Surgeon: Daleen Dubs, DO;  Location: [facility] OR;  Service: ENT;  Laterality: N/A;   Patient Active Problem List   Diagnosis Date Noted   Primary osteoarthritis  of left knee 10/05/2023   Status post transsphenoidal pituitary resection (HCC) 04/14/2023   Pituitary adenoma (HCC) 04/14/2023   Primary osteoarthritis of right knee 09/12/2019    ONSET DATE: 10-05-23  REFERRING DIAG:    S/P L total knee replacement    THERAPY DIAG:  Muscle weakness (generalized) - Plan: PT plan of care cert/re-cert  Difficulty in walking, not elsewhere classified - Plan: PT plan of care cert/re-cert  Other symptoms and signs involving the musculoskeletal system - Plan: PT plan of care cert/re-cert  Unsteadiness on feet - Plan: PT plan of care cert/re-cert  Acute pain of left knee - Plan: PT plan of care cert/re-cert  Rationale for Evaluation and Treatment: Rehabilitation  SUBJECTIVE:  SUBJECTIVE STATEMENT: Pt had Lt TKA on 10-05-23; hospitalized for 2 days, then had HH PT until yesterday (10-27-23); pt using RW for assistance with ambulation.   Pt accompanied by: self  PERTINENT HISTORY: s/p Rt TKA (April 2021);  Lt TKA 10-05-23:  s/p craniotomy pituitary adenoma 04-14-23, h/o breast cancer   PAIN:  Are you having pain? Yes: NPRS scale: 5/10 Pain location: Lt knee Pain description: throbbing Aggravating factors: prolonged sitting Relieving factors: Tylenol  and pain medication only if needed  PRECAUTIONS: None - no driving   RED FLAGS: None   WEIGHT BEARING RESTRICTIONS: No  FALLS: Has patient fallen in last 6 months? No  LIVING ENVIRONMENT: Lives with: lives alone Lives in: House/apartment Stairs: Yes: External: 5 steps; on right going up;  no steps at back door - using this entrance now Has following equipment at home: Single point cane, Walker - 2 wheeled, Marine scientist  PLOF: Independent with basic ADLs, Independent with household mobility without device,  and Independent with community mobility with device  PATIENT GOALS: "be able to get out in my yard and work in my flowers"; walk without RW  OBJECTIVE:  Note: Objective measures were completed at Evaluation unless otherwise noted.  DIAGNOSTIC FINDINGS: N/A  COGNITION: Overall cognitive status: Within functional limits for tasks assessed   SENSATION: WFL  COORDINATION: WFL's RLE:  decreased speed of movements with LLE due to c/o  knee pain s/p TKA  POSTURE: No Significant postural limitations  LOWER EXTREMITY ROM:     Active  Right Eval Left Eval  Hip flexion    Hip extension    Hip abduction    Hip adduction    Hip internal rotation    Hip external rotation    Knee flexion  85 in seated, 80 degrees in supine; 90 degrees in seated at end of session  Knee extension  -35 seated, -24 supine  Ankle dorsiflexion    Ankle plantarflexion    Ankle inversion    Ankle eversion     (Blank rows = not tested)  LOWER EXTREMITY MMT:    MMT Right Eval Left Eval  Hip flexion    Hip extension    Hip abduction    Hip adduction    Hip internal rotation    Hip external rotation    Knee flexion    Knee extension  4 (c/o mild pain with resistance)  Ankle dorsiflexion    Ankle plantarflexion    Ankle inversion    Ankle eversion    (Blank rows = not tested)  BED MOBILITY:  Findings: modified independent  TRANSFERS: Sit to stand: Modified independence  Assistive device utilized: bil. UE support from chair      STAIRS:  TBA  GAIT: Findings: Gait Characteristics: step through pattern, decreased hip/knee flexion- Left, and antalgic, Distance walked: 60', Assistive device utilized:Walker - 2 wheeled, Level of assistance: Modified independence, and Comments: antalgic gait pattern due to s/p Lt TKA  FUNCTIONAL TESTS:  5 times sit to stand: TBA Timed up and go (TUG): 20.44 secs 10 meter walk test: 27.63 secs =  1.19 ft/sec with RW Pt stood for 1" without UE support by side of  mat table - LLE started tremoring after standing for approx. 40 secs  TREATMENT DATE: 10-28-23  [employer] HEP Access Code: [email or web address] URL: [email or web address] Date: 10/28/2023 Prepared by: Johnnette Nakayama  Exercises - Supine Straight Leg Raises  - 1 x daily - 7 x weekly - 3 sets - 10 reps - Supine Heel Slides  - 1 x daily - 7 x weekly - 3 sets - 10 reps - Seated Long Arc Quad  - 1 x daily - 7 x weekly - 3 sets - 10 reps - 3-5 sec hold - Supine Short Arc Quad  - 1 x daily - 7 x weekly - 3 sets - 10 reps - 3 sec hold - Side Leg Lifts  - 1 x daily - 7 x weekly - 3 sets - 10 reps - Seated Knee Flexion Extension AAROM with Overpressure  - 1 x daily - 7 x weekly - 3 sets - 10 reps  PATIENT EDUCATION: Education details: [employer] HEP Person educated: Patient Education method: Programmer, multimedia, Demonstration, and Handouts Education comprehension: verbalized understanding and returned demonstration  HOME EXERCISE PROGRAM: [employer]  GOALS: Goals reviewed with patient? Yes  SHORT TERM GOALS: Target date: 11-26-23  Increase Lt knee active flexion to 95 degrees for increased mobility and ease of transfers, I.e. sit to stand and car transfers. Baseline: 90 degrees actively at end of eval session after performing exercises (5-22) Goal status: INITIAL  2.  Increase Lt knee active extension to </= -18 degrees in supine position for reduced hamstring contracture and improved mobility and ROM of LLE. Baseline: -35 seated, -24 degrees in supine Goal status: INITIAL  3.  Pt will negotiate 4 steps with use of bil. Hand rails using step by step sequence with SBA.  Baseline: TBA Goal status: INITIAL  4.  Pt will improve TUG score to  Baseline:  Goal status: INITIAL  5.  Amb. 115' with SPC with CGA on flat, even surface for increased household accessibility with use of  SPC. Baseline:  Goal status: INITIAL  6.  Independent in HEP for LLE strengthening, ROM, and balance. Baseline:  Goal status: INITIAL   LONG TERM GOALS: Target date: 12-24-23  Pt will ambulate 350' with SPC on even and uneven paved surfaces with supervision. Baseline: using RW Goal status: INITIAL  2.  Increase Lt knee active flexion to >/= 100 degrees in seated position for improved flexibility & mobility of LLE. Baseline: 90 degrees actively at end of eval session after performing exercises (5-22) Goal status: INITIAL  3.  Increase Lt knee active extension to </= -15 degrees in supine position for reduced hamstring contracture and improved mobility and ROM of LLE. Baseline: -35 seated, -24 degrees in supine Goal status: INITIAL  4.  Improve TUG score to </= 14 secs with RW to demonstrate reduced fall risk and improved mobility. Baseline: 20.44 secs with RW Goal status: INITIAL  5.  Increase gait velocity to >/= 2.0 ft/sec with SPC for increased gait efficiency. Baseline: 1.19 ft/sec with RW Goal status: INITIAL  6.  Pt will report ability to return to working in her flowers with Lt knee discomfort </= 1/10 intensity. Baseline: 5/10 pain in Lt knee; pt is currently unable to work in her flowers Goal status: INITIAL  ASSESSMENT:  CLINICAL IMPRESSION: Patient is a 83 y.o. lady who was seen today for physical therapy evaluation and treatment for s/p Lt TKA.  Pt is ambulating with use of RW modified independently.  Pt has mildly decreased Lt knee passive and active ROM, but is progressing well as she is s/p surgery  for Lt TKA on 10-05-23.  Pt is able to stand unsupported but has decreased dynamic standing balance with inability to reach >3" outside BOS without UE support due to LLE weakness and fear of falling.  Pt will benefit from OP PT to address Lt knee ROM deficits, LLE weakness, gait and balance deficits.    OBJECTIVE IMPAIRMENTS: decreased activity tolerance, decreased  balance, difficulty walking, decreased ROM, decreased strength, and pain.   ACTIVITY LIMITATIONS: carrying, lifting, bending, standing, squatting, stairs, transfers, and locomotion level  PARTICIPATION LIMITATIONS: meal prep, cleaning, laundry, driving, shopping, community activity, and yard work  PERSONAL FACTORS: Age, Fitness, and 1 comorbidity: s/p Lt TKA are also affecting patient's functional outcome.   REHAB POTENTIAL: Good  CLINICAL DECISION MAKING: Evolving/moderate complexity  EVALUATION COMPLEXITY: Moderate  PLAN:  PT FREQUENCY: 2x/week  PT DURATION: 8 weeks + eval  PLANNED INTERVENTIONS: 97110-Therapeutic exercises, 97530- Therapeutic activity, [id number]- Neuromuscular re-education, 97535- Self Care, 96045- Manual therapy, [phone]- Gait training, and [phone]- Aquatic Therapy  PLAN FOR NEXT SESSION: check HEP - continue Lt knee strengthening and gait training   Kisean Rollo, Celeste Cola, PT 10/29/2023, 8:38 AM

## 2023-11-02 ENCOUNTER — Ambulatory Visit: Admitting: Physical Therapy

## 2023-11-02 VITALS — BP 119/73 | HR 80

## 2023-11-02 DIAGNOSIS — R29898 Other symptoms and signs involving the musculoskeletal system: Secondary | ICD-10-CM | POA: Diagnosis not present

## 2023-11-02 DIAGNOSIS — R262 Difficulty in walking, not elsewhere classified: Secondary | ICD-10-CM | POA: Diagnosis not present

## 2023-11-02 DIAGNOSIS — M25562 Pain in left knee: Secondary | ICD-10-CM | POA: Diagnosis not present

## 2023-11-02 DIAGNOSIS — R2681 Unsteadiness on feet: Secondary | ICD-10-CM | POA: Diagnosis not present

## 2023-11-02 DIAGNOSIS — M6281 Muscle weakness (generalized): Secondary | ICD-10-CM

## 2023-11-02 NOTE — Therapy (Unsigned)
 OUTPATIENT PHYSICAL THERAPY NEURO TREATMENT NOTE   Patient Name: Alexis Kramer MRN: [id number] DOB:1940/06/24, 83 y.o., female Today's Date: 11/03/2023   PCP: Victorio Grave, MD REFERRING PROVIDER: Dayne Even, MD  END OF SESSION:  PT End of Session - 11/03/23 1620     Visit Number 2    Number of Visits 17    Date for PT Re-Evaluation 12/24/23    Authorization Type [employer] Medicare    Authorization Time Period 10-28-23 - 01-06-24    PT Start Time 1401    PT Stop Time 1446    PT Time Calculation (min) 45 min    Equipment Utilized During Treatment Gait belt    Activity Tolerance Patient tolerated treatment well    Behavior During Therapy WFL for tasks assessed/performed              Past Medical History:  Diagnosis Date   Anxiety    Arthritis    both knees   Breast cancer (HCC)    Cancer (HCC) 1994   breast cancer ; left side   Depression    situational   GERD (gastroesophageal reflux disease)    Pre-diabetes    Past Surgical History:  Procedure Laterality Date   ABDOMINAL HYSTERECTOMY     APPENDECTOMY     CRANIOTOMY N/A 04/14/2023   Procedure: Endoscopic endonasal resection of pituitary tumor;  Surgeon: Cannon Champion, MD;  Location: [facility] OR;  Service: Neurosurgery;  Laterality: N/A;   EYE SURGERY Bilateral 2021   MASTECTOMY Left 1997   TONSILLECTOMY     TOTAL KNEE ARTHROPLASTY Right 09/12/2019   Procedure: RIGHT TOTAL KNEE ARTHROPLASTY;  Surgeon: Dayne Even, MD;  Location: [facility] OR[facility];  Service: Orthopedics;  Laterality: Right;   TOTAL KNEE ARTHROPLASTY Left 10/05/2023   Procedure: ARTHROPLASTY, KNEE, TOTAL;  Surgeon: Dayne Even, MD;  Location: [facility];  Service: Orthopedics;  Laterality: Left;  LEFT TOTAL KNEE ARTHROPLASTY   TRANSNASAL APPROACH N/A 04/14/2023   Procedure: TRANSNASAL APPROACH;  Surgeon: Daleen Dubs, DO;  Location: [facility] OR;  Service: ENT;  Laterality: N/A;   Patient Active Problem List   Diagnosis Date Noted   Primary  osteoarthritis of left knee 10/05/2023   Status post transsphenoidal pituitary resection (HCC) 04/14/2023   Pituitary adenoma (HCC) 04/14/2023   Primary osteoarthritis of right knee 09/12/2019    ONSET DATE: 10-05-23  REFERRING DIAG:    S/P L total knee replacement    THERAPY DIAG:  Muscle weakness (generalized)  Difficulty in walking, not elsewhere classified  Rationale for Evaluation and Treatment: Rehabilitation  SUBJECTIVE:  SUBJECTIVE STATEMENT: Pt reports she is not able to sleep well - states she wakes up after approx. 3 hours and can't seem to get in comfortable position.  Pt reports she has not done HEP very much since initial eval last week.   Pt accompanied by: self  PERTINENT HISTORY: s/p Rt TKA (April 2021);  Lt TKA 10-05-23:  s/p craniotomy pituitary adenoma 04-14-23, h/o breast cancer   PAIN:  Are you having pain? Yes: NPRS scale: 5/10 Pain location: Lt knee Pain description: throbbing Aggravating factors: prolonged sitting Relieving factors: Tylenol  and pain medication only if needed  PRECAUTIONS: None - no driving   RED FLAGS: None   WEIGHT BEARING RESTRICTIONS: No  FALLS: Has patient fallen in last 6 months? No  LIVING ENVIRONMENT: Lives with: lives alone Lives in: House/apartment Stairs: Yes: External: 5 steps; on right going up;  no steps at back door - using this entrance now Has following equipment at home: Single point cane, Walker - 2 wheeled, Marine scientist  PLOF: Independent with basic ADLs, Independent with household mobility without device, and Independent with community mobility with device  PATIENT GOALS: "be able to get out in my yard and work in my flowers"; walk without RW  OBJECTIVE:  Note: Objective measures were completed at Evaluation unless  otherwise noted.  DIAGNOSTIC FINDINGS: N/A  COGNITION: Overall cognitive status: Within functional limits for tasks assessed   SENSATION: WFL  COORDINATION: WFL's RLE:  decreased speed of movements with LLE due to c/o  knee pain s/p TKA  POSTURE: No Significant postural limitations  LOWER EXTREMITY ROM:     Active  Right Eval Left Eval  Hip flexion    Hip extension    Hip abduction    Hip adduction    Hip internal rotation    Hip external rotation    Knee flexion  85 in seated, 80 degrees in supine; 90 degrees in seated at end of session  Knee extension  -35 seated, -24 supine  Ankle dorsiflexion    Ankle plantarflexion    Ankle inversion    Ankle eversion     (Blank rows = not tested)  LOWER EXTREMITY MMT:    MMT Right Eval Left Eval  Hip flexion    Hip extension    Hip abduction    Hip adduction    Hip internal rotation    Hip external rotation    Knee flexion    Knee extension  4 (c/o mild pain with resistance)  Ankle dorsiflexion    Ankle plantarflexion    Ankle inversion    Ankle eversion    (Blank rows = not tested)  BED MOBILITY:  Findings: modified independent  TRANSFERS: Sit to stand: Modified independence  Assistive device utilized: bil. UE support from chair      STAIRS:  TBA  GAIT: Findings: Gait Characteristics: step through pattern, decreased hip/knee flexion- Left, and antalgic, Distance walked: 60', Assistive device utilized:Walker - 2 wheeled, Level of assistance: Modified independence, and Comments: antalgic gait pattern due to s/p Lt TKA  FUNCTIONAL TESTS:  5 times sit to stand: TBA Timed up and go (TUG): 20.44 secs 10 meter walk test: 27.63 secs =  1.19 ft/sec with RW Pt stood for 1" without UE support by side of mat table - LLE started tremoring after standing for approx. 40 secs  TREATMENT DATE: 11-02-23    TherEx: In seated position in chair:  pt performed LAQ's LLE 10 reps with 3 sec hold, no weight used:  passive Lt knee flexion seated in chair - gentle overpressure given at end ROM;  Lt knee active flexion = 103 degrees  In supine position - Lt heel slides 10 reps - no weight LLE SLR 10 reps no weight;  then 10 reps with 2# LLE SLR LLE SAQ 10 reps with 2# weight - leg on blue bolster LLE quad sets - 10 reps with 5 sec hold - towel roll placed under Lt lower leg  Seated Lt knee flexion with yellow theraband for resistance 10 reps with 3 sec hold  SciFit - level 2.5 x 5" with bil. UE and LE's   Gait: Pt amb. Clinic distances with RW modified independently  Step training with use of bil. Hand rails - step by step sequence - 2 reps (8 steps total) with SBA  Gait velocity: 18.28 secs = 10 m walk test = 1.79 ft/sec with RW  Gait train inside // bars - 10' x 6 reps with UE support on // bars prn - gradual decrease in UE weight bearing on // bars but consistent bil. UE support on bars   Medbridge HEP Access Code: [email or web address] URL: [email or web address] Date: 10/28/2023 Prepared by: Johnnette Nakayama  Exercises - Supine Straight Leg Raises  - 1 x daily - 7 x weekly - 3 sets - 10 reps - Supine Heel Slides  - 1 x daily - 7 x weekly - 3 sets - 10 reps - Seated Long Arc Quad  - 1 x daily - 7 x weekly - 3 sets - 10 reps - 3-5 sec hold - Supine Short Arc Quad  - 1 x daily - 7 x weekly - 3 sets - 10 reps - 3 sec hold - Side Leg Lifts  - 1 x daily - 7 x weekly - 3 sets - 10 reps - Seated Knee Flexion Extension AAROM with Overpressure  - 1 x daily - 7 x weekly - 3 sets - 10 reps  PATIENT EDUCATION: Education details: [employer] HEP Person educated: Patient Education method: Programmer, multimedia, Demonstration, and Handouts Education comprehension: verbalized understanding and returned demonstration  HOME EXERCISE PROGRAM: [employer]  GOALS: Goals reviewed with patient? Yes  SHORT TERM  GOALS: Target date: 11-26-23  Increase Lt knee active flexion to 95 degrees for increased mobility and ease of transfers, I.e. sit to stand and car transfers. Baseline: 90 degrees actively at end of eval session after performing exercises (5-22) Goal status: INITIAL  2.  Increase Lt knee active extension to </= -18 degrees in supine position for reduced hamstring contracture and improved mobility and ROM of LLE. Baseline: -35 seated, -24 degrees in supine Goal status: INITIAL  3.  Pt will negotiate 4 steps with use of bil. Hand rails using step by step sequence with SBA.  Baseline: TBA Goal status: INITIAL  4.  Pt will improve TUG score to  Baseline:  Goal status: INITIAL  5.  Amb. 115' with SPC with CGA on flat, even surface for increased household accessibility with use of SPC. Baseline:  Goal status: INITIAL  6.  Independent in HEP for LLE strengthening, ROM, and balance. Baseline:  Goal status: INITIAL   LONG TERM GOALS: Target date: 12-24-23  Pt will ambulate 350' with SPC on even and uneven paved surfaces with supervision. Baseline: using RW Goal status: INITIAL  2.  Increase Lt  knee active flexion to >/= 100 degrees in seated position for improved flexibility & mobility of LLE. Baseline: 90 degrees actively at end of eval session after performing exercises (5-22) Goal status: INITIAL  3.  Increase Lt knee active extension to </= -15 degrees in supine position for reduced hamstring contracture and improved mobility and ROM of LLE. Baseline: -35 seated, -24 degrees in supine Goal status: INITIAL  4.  Improve TUG score to </= 14 secs with RW to demonstrate reduced fall risk and improved mobility. Baseline: 20.44 secs with RW Goal status: INITIAL  5.  Increase gait velocity to >/= 2.0 ft/sec with SPC for increased gait efficiency. Baseline: 1.19 ft/sec with RW Goal status: INITIAL  6.  Pt will report ability to return to working in her flowers with Lt knee discomfort  </= 1/10 intensity. Baseline: 5/10 pain in Lt knee; pt is currently unable to work in her flowers Goal status: INITIAL  ASSESSMENT:  CLINICAL IMPRESSION: PT session focused on LLE strengthening, Lt knee AROM and gait training with reduced UE support on // bars.  Pt's gait velocity has increased from 1.19 ft/sec to 1.79 ft/sec with RW.  Pt able to perform Lt SLR and SAQ's with 2# weight for strengthening.  Pt able to negotiate steps using a step by step sequence with use of bil. Hand rails with SBA - cues given for correct sequence with step negotiation.  Cont with POC.   OBJECTIVE IMPAIRMENTS: decreased activity tolerance, decreased balance, difficulty walking, decreased ROM, decreased strength, and pain.   ACTIVITY LIMITATIONS: carrying, lifting, bending, standing, squatting, stairs, transfers, and locomotion level  PARTICIPATION LIMITATIONS: meal prep, cleaning, laundry, driving, shopping, community activity, and yard work  PERSONAL FACTORS: Age, Fitness, and 1 comorbidity: s/p Lt TKA are also affecting patient's functional outcome.   REHAB POTENTIAL: Good  CLINICAL DECISION MAKING: Evolving/moderate complexity  EVALUATION COMPLEXITY: Moderate  PLAN:  PT FREQUENCY: 2x/week  PT DURATION: 8 weeks + eval  PLANNED INTERVENTIONS: 97110-Therapeutic exercises, 97530- Therapeutic activity, [id number]- Neuromuscular re-education, 97535- Self Care, 16109- Manual therapy, [phone]- Gait training, and [phone]- Aquatic Therapy  PLAN FOR NEXT SESSION: check HEP - continue Lt knee strengthening and gait training   Taylorann Tkach, Celeste Cola, PT 11/03/2023, 5:03 PM

## 2023-11-03 ENCOUNTER — Encounter: Payer: Self-pay | Admitting: Physical Therapy

## 2023-11-04 ENCOUNTER — Ambulatory Visit: Admitting: Physical Therapy

## 2023-11-04 DIAGNOSIS — M25562 Pain in left knee: Secondary | ICD-10-CM | POA: Diagnosis not present

## 2023-11-04 DIAGNOSIS — R262 Difficulty in walking, not elsewhere classified: Secondary | ICD-10-CM | POA: Diagnosis not present

## 2023-11-04 DIAGNOSIS — R2681 Unsteadiness on feet: Secondary | ICD-10-CM | POA: Diagnosis not present

## 2023-11-04 DIAGNOSIS — R29898 Other symptoms and signs involving the musculoskeletal system: Secondary | ICD-10-CM | POA: Diagnosis not present

## 2023-11-04 DIAGNOSIS — M6281 Muscle weakness (generalized): Secondary | ICD-10-CM | POA: Diagnosis not present

## 2023-11-04 NOTE — Therapy (Unsigned)
 OUTPATIENT PHYSICAL THERAPY NEURO TREATMENT NOTE   Patient Name: Alexis Kramer MRN: [id number] DOB:11-26-1940, 83 y.o., female Today's Date: 11/05/2023   PCP: Victorio Grave, MD REFERRING PROVIDER: Dayne Even, MD  END OF SESSION:  PT End of Session - 11/05/23 1412     Visit Number 3    Number of Visits 17    Date for PT Re-Evaluation 12/24/23    Authorization Type [employer] Medicare    Authorization Time Period 10-28-23 - 01-06-24    PT Start Time 1316    PT Stop Time 1400    PT Time Calculation (min) 44 min    Equipment Utilized During Treatment Gait belt    Activity Tolerance Patient tolerated treatment well    Behavior During Therapy WFL for tasks assessed/performed               Past Medical History:  Diagnosis Date   Anxiety    Arthritis    both knees   Breast cancer (HCC)    Cancer (HCC) 1994   breast cancer ; left side   Depression    situational   GERD (gastroesophageal reflux disease)    Pre-diabetes    Past Surgical History:  Procedure Laterality Date   ABDOMINAL HYSTERECTOMY     APPENDECTOMY     CRANIOTOMY N/A 04/14/2023   Procedure: Endoscopic endonasal resection of pituitary tumor;  Surgeon: Cannon Champion, MD;  Location: [facility] OR;  Service: Neurosurgery;  Laterality: N/A;   EYE SURGERY Bilateral 2021   MASTECTOMY Left 1997   TONSILLECTOMY     TOTAL KNEE ARTHROPLASTY Right 09/12/2019   Procedure: RIGHT TOTAL KNEE ARTHROPLASTY;  Surgeon: Dayne Even, MD;  Location: [facility];  Service: Orthopedics;  Laterality: Right;   TOTAL KNEE ARTHROPLASTY Left 10/05/2023   Procedure: ARTHROPLASTY, KNEE, TOTAL;  Surgeon: Dayne Even, MD;  Location: [facility];  Service: Orthopedics;  Laterality: Left;  LEFT TOTAL KNEE ARTHROPLASTY   TRANSNASAL APPROACH N/A 04/14/2023   Procedure: TRANSNASAL APPROACH;  Surgeon: Daleen Dubs, DO;  Location: [facility] OR;  Service: ENT;  Laterality: N/A;   Patient Active Problem List   Diagnosis Date Noted   Primary  osteoarthritis of left knee 10/05/2023   Status post transsphenoidal pituitary resection (HCC) 04/14/2023   Pituitary adenoma (HCC) 04/14/2023   Primary osteoarthritis of right knee 09/12/2019    ONSET DATE: 10-05-23  REFERRING DIAG:    S/P L total knee replacement    THERAPY DIAG:  Muscle weakness (generalized)  Difficulty in walking, not elsewhere classified  Unsteadiness on feet  Rationale for Evaluation and Treatment: Rehabilitation  SUBJECTIVE:  SUBJECTIVE STATEMENT: Pt reports she slept a little better last night. Is not doing HEP consistently at home - when asked why she is not doing so, she replies "I don't know - it's kind of boring"  Pt accompanied by: self  PERTINENT HISTORY: s/p Rt TKA (April 2021);  Lt TKA 10-05-23:  s/p craniotomy pituitary adenoma 04-14-23, h/o breast cancer   PAIN:  Are you having pain? Yes: NPRS scale: 5/10 Pain location: Lt knee Pain description: throbbing Aggravating factors: prolonged sitting Relieving factors: Tylenol  and pain medication only if needed  PRECAUTIONS: None - no driving   RED FLAGS: None   WEIGHT BEARING RESTRICTIONS: No  FALLS: Has patient fallen in last 6 months? No  LIVING ENVIRONMENT: Lives with: lives alone Lives in: House/apartment Stairs: Yes: External: 5 steps; on right going up;  no steps at back door - using this entrance now Has following equipment at home: Single point cane, Walker - 2 wheeled, Marine scientist  PLOF: Independent with basic ADLs, Independent with household mobility without device, and Independent with community mobility with device  PATIENT GOALS: "be able to get out in my yard and work in my flowers"; walk without RW  OBJECTIVE:  Note: Objective measures were completed at Evaluation unless  otherwise noted.  DIAGNOSTIC FINDINGS: N/A  COGNITION: Overall cognitive status: Within functional limits for tasks assessed   SENSATION: WFL  COORDINATION: WFL's RLE:  decreased speed of movements with LLE due to c/o  knee pain s/p TKA  POSTURE: No Significant postural limitations  LOWER EXTREMITY ROM:     Active  Right Eval Left Eval  Hip flexion    Hip extension    Hip abduction    Hip adduction    Hip internal rotation    Hip external rotation    Knee flexion  85 in seated, 80 degrees in supine; 90 degrees in seated at end of session  Knee extension  -35 seated, -24 supine  Ankle dorsiflexion    Ankle plantarflexion    Ankle inversion    Ankle eversion     (Blank rows = not tested)  LOWER EXTREMITY MMT:    MMT Right Eval Left Eval  Hip flexion    Hip extension    Hip abduction    Hip adduction    Hip internal rotation    Hip external rotation    Knee flexion    Knee extension  4 (c/o mild pain with resistance)  Ankle dorsiflexion    Ankle plantarflexion    Ankle inversion    Ankle eversion    (Blank rows = not tested)  BED MOBILITY:  Findings: modified independent  TRANSFERS: Sit to stand: Modified independence  Assistive device utilized: bil. UE support from chair      STAIRS:  TBA  GAIT: Findings: Gait Characteristics: step through pattern, decreased hip/knee flexion- Left, and antalgic, Distance walked: 60', Assistive device utilized:Walker - 2 wheeled, Level of assistance: Modified independence, and Comments: antalgic gait pattern due to s/p Lt TKA  FUNCTIONAL TESTS:  5 times sit to stand: TBA Timed up and go (TUG): 20.44 secs 10 meter walk test: 27.63 secs =  1.19 ft/sec with RW Pt stood for 1" without UE support by side of mat table - LLE started tremoring after standing for approx. 40 secs  TREATMENT DATE: 11-04-23    TherEx: In seated position in chair:  pt performed LAQ's LLE 10 reps with 3 sec hold, no weight used:  passive Lt knee flexion seated in chair - gentle overpressure given at end ROM;  Lt knee active flexion = 105 degrees  In supine position: LLE -Lt heel slides 10 reps to increase Lt knee flexion -SLR 2# (placed above knee) 10 reps:  5 reps 2# weight positioned at ankle -Quad sets LLE 15 reps:  2# weight placed above knee - additional 10 reps SAQ's LLE 2# - 2 sets 10 reps - used blue bolster  In seated position - LLE LAQ's 10 reps 2# weight:  no weight 10 reps  Standing Lt knee flexion no weight ; red theraband 10 reps - in seated positon  SciFit - level 2.5 x 5" with bil. UE and LE's  TherAct: Standing LLE closed chain strengthening/weight bearing activities inside // bars: Tap ups to 4" step performed with RLE with min. Bil. UE support on // bars Step ups onto 4" step with LLE for quad strengthening - with bil. UE support on bars- 10 reps  Marching in place 10 reps each leg with bil. UE support on bars  Gait: Pt amb. Clinic distances with RW modified independently  Step training with use of bil. Hand rails - step by step sequence - 2 reps (8 steps total) with SBA  Gait train inside // bars - 10' x 4 reps with UE support on // bars prn - gradual decrease in UE weight bearing on // bars but consistent bil. UE support on bars  Gait training with RUE support only on // bars (for [facility] simulation) - 10' x 4 reps with SBA   Sidestepping 10' x 2 reps inside // bars with UE support prn;  backwards amb. 10' x 2 reps with UE support prn on // bars  [employer] HEP Access Code: [email or web address] URL: [email or web address] Date: 10/28/2023 Prepared by: Johnnette Nakayama  Exercises - Supine Straight Leg Raises  - 1 x daily - 7 x weekly - 3 sets - 10 reps - Supine Heel Slides  - 1 x daily - 7 x weekly - 3 sets - 10 reps - Seated Long Arc Quad  - 1 x daily - 7 x weekly - 3 sets - 10 reps  - 3-5 sec hold - Supine Short Arc Quad  - 1 x daily - 7 x weekly - 3 sets - 10 reps - 3 sec hold - Side Leg Lifts  - 1 x daily - 7 x weekly - 3 sets - 10 reps - Seated Knee Flexion Extension AAROM with Overpressure  - 1 x daily - 7 x weekly - 3 sets - 10 reps  PATIENT EDUCATION: Education details: [employer] HEP Person educated: Patient Education method: Programmer, multimedia, Demonstration, and Handouts Education comprehension: verbalized understanding and returned demonstration  HOME EXERCISE PROGRAM: [employer]  GOALS: Goals reviewed with patient? Yes  SHORT TERM GOALS: Target date: 11-26-23  Increase Lt knee active flexion to 95 degrees for increased mobility and ease of transfers, I.e. sit to stand and car transfers. Baseline: 90 degrees actively at end of eval session after performing exercises (5-22) Goal status: INITIAL  2.  Increase Lt knee active extension to </= -18 degrees in supine position for reduced hamstring contracture and improved mobility and ROM of LLE. Baseline: -35 seated, -24 degrees in supine Goal status: INITIAL  3.  Pt will negotiate 4 steps with use of  bil. Hand rails using step by step sequence with SBA.  Baseline: TBA Goal status: INITIAL  4.  Pt will improve TUG score to  Baseline:  Goal status: INITIAL  5.  Amb. 115' with SPC with CGA on flat, even surface for increased household accessibility with use of SPC. Baseline:  Goal status: INITIAL  6.  Independent in HEP for LLE strengthening, ROM, and balance. Baseline:  Goal status: INITIAL   LONG TERM GOALS: Target date: 12-24-23  Pt will ambulate 350' with SPC on even and uneven paved surfaces with supervision. Baseline: using RW Goal status: INITIAL  2.  Increase Lt knee active flexion to >/= 100 degrees in seated position for improved flexibility & mobility of LLE. Baseline: 90 degrees actively at end of eval session after performing exercises (5-22) Goal status: INITIAL  3.  Increase Lt knee  active extension to </= -15 degrees in supine position for reduced hamstring contracture and improved mobility and ROM of LLE. Baseline: -35 seated, -24 degrees in supine Goal status: INITIAL  4.  Improve TUG score to </= 14 secs with RW to demonstrate reduced fall risk and improved mobility. Baseline: 20.44 secs with RW Goal status: INITIAL  5.  Increase gait velocity to >/= 2.0 ft/sec with SPC for increased gait efficiency. Baseline: 1.19 ft/sec with RW Goal status: INITIAL  6.  Pt will report ability to return to working in her flowers with Lt knee discomfort </= 1/10 intensity. Baseline: 5/10 pain in Lt knee; pt is currently unable to work in her flowers Goal status: INITIAL  ASSESSMENT:  CLINICAL IMPRESSION: PT session focused on Lt knee musc. strengthening and Lt knee ROM.  Pt able to actively flex Lt knee in seated position to 105 degrees in today's session, increased by 2 degrees from 103 degrees in previous session this week.  Pt able to perform Lt hamstring strengthening with use of red theraband, increased resistance from yellow theraband used in previous session.  Pt also demonstrates decreased UE weight bearing with gait training inside // bars in today's session.  Pt is progressing well towards goals.  Cont with POC.   OBJECTIVE IMPAIRMENTS: decreased activity tolerance, decreased balance, difficulty walking, decreased ROM, decreased strength, and pain.   ACTIVITY LIMITATIONS: carrying, lifting, bending, standing, squatting, stairs, transfers, and locomotion level  PARTICIPATION LIMITATIONS: meal prep, cleaning, laundry, driving, shopping, community activity, and yard work  PERSONAL FACTORS: Age, Fitness, and 1 comorbidity: s/p Lt TKA are also affecting patient's functional outcome.   REHAB POTENTIAL: Good  CLINICAL DECISION MAKING: Evolving/moderate complexity  EVALUATION COMPLEXITY: Moderate  PLAN:  PT FREQUENCY: 2x/week  PT DURATION: 8 weeks + eval  PLANNED  INTERVENTIONS: 97110-Therapeutic exercises, 97530- Therapeutic activity, [id number]- Neuromuscular re-education, 97535- Self Care, 16109- Manual therapy, [phone]- Gait training, and [phone]- Aquatic Therapy  PLAN FOR NEXT SESSION:  continue Lt knee strengthening and gait training - begin gait training with SPC if tolerated   Julionna Marczak, Celeste Cola, PT 11/05/2023, 2:24 PM

## 2023-11-05 ENCOUNTER — Encounter: Payer: Self-pay | Admitting: Physical Therapy

## 2023-11-09 ENCOUNTER — Ambulatory Visit: Payer: Self-pay | Attending: Orthopaedic Surgery | Admitting: Physical Therapy

## 2023-11-09 DIAGNOSIS — R29898 Other symptoms and signs involving the musculoskeletal system: Secondary | ICD-10-CM | POA: Diagnosis not present

## 2023-11-09 DIAGNOSIS — R2681 Unsteadiness on feet: Secondary | ICD-10-CM | POA: Insufficient documentation

## 2023-11-09 DIAGNOSIS — R262 Difficulty in walking, not elsewhere classified: Secondary | ICD-10-CM | POA: Diagnosis not present

## 2023-11-09 DIAGNOSIS — M6281 Muscle weakness (generalized): Secondary | ICD-10-CM | POA: Diagnosis not present

## 2023-11-09 NOTE — Therapy (Unsigned)
 OUTPATIENT PHYSICAL THERAPY NEURO TREATMENT NOTE   Patient Name: Alexis Kramer MRN: [id number] DOB:[birth year], 83 y.o., female Today's Date: 11/11/2023   PCP: Victorio Grave, MD REFERRING PROVIDER: Dayne Even, MD  END OF SESSION:  PT End of Session - 11/11/23 1252     Visit Number 4    Number of Visits 17    Date for PT Re-Evaluation 12/24/23    Authorization Type [employer] Medicare    Authorization Time Period 10-28-23 - 01-06-24    PT Start Time 1315    PT Stop Time 1400    PT Time Calculation (min) 45 min    Equipment Utilized During Treatment Gait belt    Activity Tolerance Patient tolerated treatment well    Behavior During Therapy WFL for tasks assessed/performed                Past Medical History:  Diagnosis Date   Anxiety    Arthritis    both knees   Breast cancer (HCC)    Cancer (HCC) 1994   breast cancer ; left side   Depression    situational   GERD (gastroesophageal reflux disease)    Pre-diabetes    Past Surgical History:  Procedure Laterality Date   ABDOMINAL HYSTERECTOMY     APPENDECTOMY     CRANIOTOMY N/A 04/14/2023   Procedure: Endoscopic endonasal resection of pituitary tumor;  Surgeon: Cannon Champion, MD;  Location: [facility] OR;  Service: Neurosurgery;  Laterality: N/A;   EYE SURGERY Bilateral 2021   MASTECTOMY Left 1997   TONSILLECTOMY     TOTAL KNEE ARTHROPLASTY Right 09/12/2019   Procedure: RIGHT TOTAL KNEE ARTHROPLASTY;  Surgeon: Dayne Even, MD;  Location: [facility] ORS;  Service: Orthopedics;  Laterality: Right;   TOTAL KNEE ARTHROPLASTY Left 10/05/2023   Procedure: ARTHROPLASTY, KNEE, TOTAL;  Surgeon: Dayne Even, MD;  Location: [facility] OR[facility];  Service: Orthopedics;  Laterality: Left;  LEFT TOTAL KNEE ARTHROPLASTY   TRANSNASAL APPROACH N/A 04/14/2023   Procedure: TRANSNASAL APPROACH;  Surgeon: Daleen Dubs, DO;  Location: [facility] OR;  Service: ENT;  Laterality: N/A;   Patient Active Problem List   Diagnosis Date Noted   Primary  osteoarthritis of left knee 10/05/2023   Status post transsphenoidal pituitary resection (HCC) 04/14/2023   Pituitary adenoma (HCC) 04/14/2023   Primary osteoarthritis of right knee 09/12/2019    ONSET DATE: 10-05-23  REFERRING DIAG:    S/P L total knee replacement    THERAPY DIAG:  Muscle weakness (generalized)  Difficulty in walking, not elsewhere classified  Other symptoms and signs involving the musculoskeletal system  Rationale for Evaluation and Treatment: Rehabilitation  SUBJECTIVE:  SUBJECTIVE STATEMENT: Pt accompanied to PT by her grandson - pt states she is doing fine - says he may help her some with the exercises at home  Pt accompanied by: self  PERTINENT HISTORY: s/p Rt TKA (April 2021);  Lt TKA 10-05-23:  s/p craniotomy pituitary adenoma 04-14-23, h/o breast cancer   PAIN:  Are you having pain? Yes: NPRS scale: 5/10 Pain location: Lt knee Pain description: throbbing Aggravating factors: prolonged sitting Relieving factors: Tylenol  and pain medication only if needed  PRECAUTIONS: None - no driving   RED FLAGS: None   WEIGHT BEARING RESTRICTIONS: No  FALLS: Has patient fallen in last 6 months? No  LIVING ENVIRONMENT: Lives with: lives alone Lives in: House/apartment Stairs: Yes: External: 5 steps; on right going up;  no steps at back door - using this entrance now Has following equipment at home: Single point cane, Walker - 2 wheeled, Marine scientist  PLOF: Independent with basic ADLs, Independent with household mobility without device, and Independent with community mobility with device  PATIENT GOALS: "be able to get out in my yard and work in my flowers"; walk without RW  OBJECTIVE:  Note: Objective measures were completed at Evaluation unless otherwise  noted.  DIAGNOSTIC FINDINGS: N/A  COGNITION: Overall cognitive status: Within functional limits for tasks assessed   SENSATION: WFL  COORDINATION: WFL's RLE:  decreased speed of movements with LLE due to c/o  knee pain s/p TKA  POSTURE: No Significant postural limitations  LOWER EXTREMITY ROM:     Active  Right Eval Left Eval  Hip flexion    Hip extension    Hip abduction    Hip adduction    Hip internal rotation    Hip external rotation    Knee flexion  85 in seated, 80 degrees in supine; 90 degrees in seated at end of session  Knee extension  -35 seated, -24 supine  Ankle dorsiflexion    Ankle plantarflexion    Ankle inversion    Ankle eversion     (Blank rows = not tested)  LOWER EXTREMITY MMT:    MMT Right Eval Left Eval  Hip flexion    Hip extension    Hip abduction    Hip adduction    Hip internal rotation    Hip external rotation    Knee flexion    Knee extension  4 (c/o mild pain with resistance)  Ankle dorsiflexion    Ankle plantarflexion    Ankle inversion    Ankle eversion    (Blank rows = not tested)  BED MOBILITY:  Findings: modified independent  TRANSFERS: Sit to stand: Modified independence  Assistive device utilized: bil. UE support from chair      STAIRS:  TBA  GAIT: Findings: Gait Characteristics: step through pattern, decreased hip/knee flexion- Left, and antalgic, Distance walked: 60', Assistive device utilized:Walker - 2 wheeled, Level of assistance: Modified independence, and Comments: antalgic gait pattern due to s/p Lt TKA  FUNCTIONAL TESTS:  5 times sit to stand: TBA Timed up and go (TUG): 20.44 secs 10 meter walk test: 27.63 secs =  1.19 ft/sec with RW Pt stood for 1" without UE support by side of mat table - LLE started tremoring after standing for approx. 40 secs  TREATMENT DATE: 11-09-23    Gait: Pt amb. Clinic distances with RW modified independently Gait train with use of SPC in Rt hand 115' x 1 rep;  pt requested to use SPC in Lt hand as this is what she was doing prior to surgery; 115' x 1 rep with CGA to SBA with SPC in Lt hand Step training with use of bil. Hand rails - step over step sequence with ascension, step by step sequence with descension due to c/o discomfort in Lt knee with increased flexion during descension - 4 steps x 3 reps with use of bil. Rails with SBA   TherEx: In seated position in chair:  pt performed LAQ's LLE 10 reps wx 2 sets w 2# ith 3 sec hold, no weight used:  passive Lt knee flexion seated in chair - gentle overpressure given at end ROM;  Lt knee active flexion = 106 degrees  In supine position: LLE -Lt heel slides 10 reps to increase Lt knee flexion -SLR 2# (placed above knee) 10 reps:  5 reps 2# weight positioned at ankle -Quad sets LLE 15 reps:  3# weight placed above knee  SAQ's LLE 2# - 2 sets 10 reps - used blue bolster  In seated position - LLE LAQ's 10 reps 2# weight:  no weight 10 reps  Standing Lt knee flexion no weight ; red theraband 10 reps - in seated positon  SciFit - level 2.5 x 5" with bil. UE and LE's  TherAct: Standing LLE closed chain strengthening/weight bearing activities inside // bars: Tap ups to 4" step performed with RLE with min. Bil. UE support on // bars; 10 reps x 2 sets Step ups onto 4" step with LLE for quad strengthening - with bil. UE support on bars- 10 reps     [employer] HEP Access Code: [email or web address] URL: [email or web address] Date: 10/28/2023 Prepared by: Johnnette Nakayama  Exercises - Supine Straight Leg Raises  - 1 x daily - 7 x weekly - 3 sets - 10 reps - Supine Heel Slides  - 1 x daily - 7 x weekly - 3 sets - 10 reps - Seated Long Arc Quad  - 1 x daily - 7 x weekly - 3 sets - 10 reps - 3-5 sec hold - Supine Short Arc Quad  - 1 x daily - 7 x weekly - 3 sets - 10 reps - 3 sec hold -  Side Leg Lifts  - 1 x daily - 7 x weekly - 3 sets - 10 reps - Seated Knee Flexion Extension AAROM with Overpressure  - 1 x daily - 7 x weekly - 3 sets - 10 reps  PATIENT EDUCATION: Education details: [employer] HEP Person educated: Patient Education method: Programmer, multimedia, Demonstration, and Handouts Education comprehension: verbalized understanding and returned demonstration  HOME EXERCISE PROGRAM: [employer]  GOALS: Goals reviewed with patient? Yes  SHORT TERM GOALS: Target date: 11-26-23  Increase Lt knee active flexion to 95 degrees for increased mobility and ease of transfers, I.e. sit to stand and car transfers. Baseline: 90 degrees actively at end of eval session after performing exercises (5-22) Goal status: INITIAL  2.  Increase Lt knee active extension to </= -18 degrees in supine position for reduced hamstring contracture and improved mobility and ROM of LLE. Baseline: -35 seated, -24 degrees in supine Goal status: INITIAL  3.  Pt will negotiate 4 steps with use of bil. Hand rails using step by step sequence with SBA.  Baseline: TBA Goal status: INITIAL  4.  Pt will improve TUG score to  Baseline:  Goal status: INITIAL  5.  Amb. 115' with SPC with CGA on flat, even surface for increased household accessibility with use of SPC. Baseline:  Goal status: INITIAL  6.  Independent in HEP for LLE strengthening, ROM, and balance. Baseline:  Goal status: INITIAL   LONG TERM GOALS: Target date: 12-24-23  Pt will ambulate 350' with SPC on even and uneven paved surfaces with supervision. Baseline: using RW Goal status: INITIAL  2.  Increase Lt knee active flexion to >/= 100 degrees in seated position for improved flexibility & mobility of LLE. Baseline: 90 degrees actively at end of eval session after performing exercises (5-22) Goal status: INITIAL  3.  Increase Lt knee active extension to </= -15 degrees in supine position for reduced hamstring contracture and improved  mobility and ROM of LLE. Baseline: -35 seated, -24 degrees in supine Goal status: INITIAL  4.  Improve TUG score to </= 14 secs with RW to demonstrate reduced fall risk and improved mobility. Baseline: 20.44 secs with RW Goal status: INITIAL  5.  Increase gait velocity to >/= 2.0 ft/sec with SPC for increased gait efficiency. Baseline: 1.19 ft/sec with RW Goal status: INITIAL  6.  Pt will report ability to return to working in her flowers with Lt knee discomfort </= 1/10 intensity. Baseline: 5/10 pain in Lt knee; pt is currently unable to work in her flowers Goal status: INITIAL  ASSESSMENT:  CLINICAL IMPRESSION: PT session focused on Lt knee musc. Strengthening, Lt knee ROM and gait training with use of SPC.  Pt able to actively flex Lt knee in seated position to 106 degrees in today's session.   Pt did very well ambulating with use of SPC; gait pattern improved with less antalgic gait noted with use of SPC in Rt hand, however, pt requests to use cane in Lt hand as this is how she was doing prior to Lt TKA.  Pt is progressing well towards goals.  Cont with POC.   OBJECTIVE IMPAIRMENTS: decreased activity tolerance, decreased balance, difficulty walking, decreased ROM, decreased strength, and pain.   ACTIVITY LIMITATIONS: carrying, lifting, bending, standing, squatting, stairs, transfers, and locomotion level  PARTICIPATION LIMITATIONS: meal prep, cleaning, laundry, driving, shopping, community activity, and yard work  PERSONAL FACTORS: Age, Fitness, and 1 comorbidity: s/p Lt TKA are also affecting patient's functional outcome.   REHAB POTENTIAL: Good  CLINICAL DECISION MAKING: Evolving/moderate complexity  EVALUATION COMPLEXITY: Moderate  PLAN:  PT FREQUENCY: 2x/week  PT DURATION: 8 weeks + eval  PLANNED INTERVENTIONS: 97110-Therapeutic exercises, 97530- Therapeutic activity, [id number]- Neuromuscular re-education, 97535- Self Care, [id number]- Manual therapy, [phone]- Gait training,  and [phone]- Aquatic Therapy  PLAN FOR NEXT SESSION:  continue Lt knee strengthening and gait training - begin gait training with SPC if tolerated   Keniel Ralston, Celeste Cola, PT 11/11/2023, 12:53 PM

## 2023-11-11 ENCOUNTER — Encounter: Payer: Self-pay | Admitting: Physical Therapy

## 2023-11-11 ENCOUNTER — Ambulatory Visit: Payer: Self-pay | Admitting: Physical Therapy

## 2023-11-11 DIAGNOSIS — M6281 Muscle weakness (generalized): Secondary | ICD-10-CM | POA: Diagnosis not present

## 2023-11-11 DIAGNOSIS — R29898 Other symptoms and signs involving the musculoskeletal system: Secondary | ICD-10-CM

## 2023-11-11 DIAGNOSIS — R2681 Unsteadiness on feet: Secondary | ICD-10-CM

## 2023-11-11 DIAGNOSIS — R262 Difficulty in walking, not elsewhere classified: Secondary | ICD-10-CM

## 2023-11-11 NOTE — Therapy (Unsigned)
 OUTPATIENT PHYSICAL THERAPY NEURO TREATMENT NOTE   Patient Name: Alexis Kramer MRN: [id number] DOB:03/17/1941, 83 y.o., female Today's Date: 11/12/2023   PCP: Victorio Grave, MD REFERRING PROVIDER: Dayne Even, MD  END OF SESSION:  PT End of Session - 11/12/23 1717     Visit Number 5    Number of Visits 17    Date for PT Re-Evaluation 12/24/23    Authorization Type [employer] Medicare    Authorization Time Period 10-28-23 - 01-06-24    PT Start Time 1317    PT Stop Time 1401    PT Time Calculation (min) 44 min    Equipment Utilized During Treatment Gait belt    Activity Tolerance Patient tolerated treatment well    Behavior During Therapy WFL for tasks assessed/performed                 Past Medical History:  Diagnosis Date   Anxiety    Arthritis    both knees   Breast cancer (HCC)    Cancer (HCC) 1994   breast cancer ; left side   Depression    situational   GERD (gastroesophageal reflux disease)    Pre-diabetes    Past Surgical History:  Procedure Laterality Date   ABDOMINAL HYSTERECTOMY     APPENDECTOMY     CRANIOTOMY N/A 04/14/2023   Procedure: Endoscopic endonasal resection of pituitary tumor;  Surgeon: Cannon Champion, MD;  Location: [facility] OR;  Service: Neurosurgery;  Laterality: N/A;   EYE SURGERY Bilateral 2021   MASTECTOMY Left 1997   TONSILLECTOMY     TOTAL KNEE ARTHROPLASTY Right 09/12/2019   Procedure: RIGHT TOTAL KNEE ARTHROPLASTY;  Surgeon: Dayne Even, MD;  Location: [facility] OR[facility];  Service: Orthopedics;  Laterality: Right;   TOTAL KNEE ARTHROPLASTY Left 10/05/2023   Procedure: ARTHROPLASTY, KNEE, TOTAL;  Surgeon: Dayne Even, MD;  Location: [facility];  Service: Orthopedics;  Laterality: Left;  LEFT TOTAL KNEE ARTHROPLASTY   TRANSNASAL APPROACH N/A 04/14/2023   Procedure: TRANSNASAL APPROACH;  Surgeon: Daleen Dubs, DO;  Location: [facility] OR;  Service: ENT;  Laterality: N/A;   Patient Active Problem List   Diagnosis Date Noted   Primary  osteoarthritis of left knee 10/05/2023   Status post transsphenoidal pituitary resection (HCC) 04/14/2023   Pituitary adenoma (HCC) 04/14/2023   Primary osteoarthritis of right knee 09/12/2019    ONSET DATE: 10-05-23  REFERRING DIAG:    S/P L total knee replacement    THERAPY DIAG:  Difficulty in walking, not elsewhere classified  Muscle weakness (generalized)  Other symptoms and signs involving the musculoskeletal system  Unsteadiness on feet  Rationale for Evaluation and Treatment: Rehabilitation  SUBJECTIVE:  SUBJECTIVE STATEMENT: Pt ambulating to PT with use of RW; says she is still not sleeping well at night, "I just can't get comfortable"; no other problems or issues reported  Pt accompanied by: self  PERTINENT HISTORY: s/p Rt TKA (April 2021);  Lt TKA 10-05-23:  s/p craniotomy pituitary adenoma 04-14-23, h/o breast cancer   PAIN:  Are you having pain? Yes: NPRS scale: 5/10 Pain location: Lt knee Pain description: throbbing Aggravating factors: prolonged sitting Relieving factors: Tylenol  and pain medication only if needed  PRECAUTIONS: None - no driving   RED FLAGS: None   WEIGHT BEARING RESTRICTIONS: No  FALLS: Has patient fallen in last 6 months? No  LIVING ENVIRONMENT: Lives with: lives alone Lives in: House/apartment Stairs: Yes: External: 5 steps; on right going up;  no steps at back door - using this entrance now Has following equipment at home: Single point cane, Walker - 2 wheeled, Marine scientist  PLOF: Independent with basic ADLs, Independent with household mobility without device, and Independent with community mobility with device  PATIENT GOALS: "be able to get out in my yard and work in my flowers"; walk without RW  OBJECTIVE:  Note: Objective measures  were completed at Evaluation unless otherwise noted.  DIAGNOSTIC FINDINGS: N/A  COGNITION: Overall cognitive status: Within functional limits for tasks assessed   SENSATION: WFL  COORDINATION: WFL's RLE:  decreased speed of movements with LLE due to c/o  knee pain s/p TKA  POSTURE: No Significant postural limitations  LOWER EXTREMITY ROM:     Active  Right Eval Left Eval  Hip flexion    Hip extension    Hip abduction    Hip adduction    Hip internal rotation    Hip external rotation    Knee flexion  85 in seated, 80 degrees in supine; 90 degrees in seated at end of session  Knee extension  -35 seated, -24 supine  Ankle dorsiflexion    Ankle plantarflexion    Ankle inversion    Ankle eversion     (Blank rows = not tested)  LOWER EXTREMITY MMT:    MMT Right Eval Left Eval  Hip flexion    Hip extension    Hip abduction    Hip adduction    Hip internal rotation    Hip external rotation    Knee flexion    Knee extension  4 (c/o mild pain with resistance)  Ankle dorsiflexion    Ankle plantarflexion    Ankle inversion    Ankle eversion    (Blank rows = not tested)  BED MOBILITY:  Findings: modified independent  TRANSFERS: Sit to stand: Modified independence  Assistive device utilized: bil. UE support from chair      STAIRS:  TBA  GAIT: Findings: Gait Characteristics: step through pattern, decreased hip/knee flexion- Left, and antalgic, Distance walked: 60', Assistive device utilized:Walker - 2 wheeled, Level of assistance: Modified independence, and Comments: antalgic gait pattern due to s/p Lt TKA  FUNCTIONAL TESTS:  5 times sit to stand: TBA Timed up and go (TUG): 20.44 secs 10 meter walk test: 27.63 secs =  1.19 ft/sec with RW Pt stood for 1" without UE support by side of mat table - LLE started tremoring after standing for approx. 40 secs  TREATMENT DATE: 11-09-23   Gait:  Gait train with use of SPC in Rt hand 115' x 2 reps - did not use cane in Lt hand as she had requested in previous session Step training with use of bil. Hand rails - step over step sequence with ascension, step by step sequence with descension on 1st rep; performed additional 3 reps and used step over step sequence with ascension and descension - with use of bil. Rails with SBA   TherEx: In seated position in chair:  pt performed LAQ's LLE 10 reps no weight used: then 2# weight used for LAQ's with 4-5 sec hold x 10 reps;  passive Lt knee flexion seated in chair -- contract/relax with Lt knee held in flexed position - 10 reps with 3 sec hold Lt knee active flexion = 108 degrees  Lt lower leg placed on stool - pt performed quad sets 10 reps with 3 sec hold  In supine position: LLE -Lt heel slides 10 reps to increase Lt knee flexion -SLR 2# (placed above knee) 10 reps:  5 reps 2# weight positioned at ankle -Quad sets LLE 15 reps:  3# weight placed above knee to increase passive extension SAQ's LLE 2# - 2 sets 10 reps - used blue bolster  In seated position - LLE LAQ's 10 reps 2# weight:  no weight 10 reps  TherAct: Standing LLE closed chain strengthening/weight bearing activities inside // bars: Tap ups to 4" step performed with RLE with min. Bil. UE support on // bars; 10 reps x 2 sets - for increased LLE weight bearing Step ups onto 4" step with LLE for quad strengthening - with bil. UE support on bars- 10 reps Alternating tap ups to 2" step without UE support 10 reps each leg Marching in place 10 reps each leg with minimal UE support on // bars    [employer] HEP Access Code: [email or web address] URL: [email or web address] Date: 10/28/2023 Prepared by: Johnnette Nakayama  Exercises - Supine Straight Leg Raises  - 1 x daily - 7 x weekly - 3 sets - 10 reps - Supine Heel Slides  - 1 x daily - 7 x weekly - 3 sets - 10 reps - Seated Long Arc Quad   - 1 x daily - 7 x weekly - 3 sets - 10 reps - 3-5 sec hold - Supine Short Arc Quad  - 1 x daily - 7 x weekly - 3 sets - 10 reps - 3 sec hold - Side Leg Lifts  - 1 x daily - 7 x weekly - 3 sets - 10 reps - Seated Knee Flexion Extension AAROM with Overpressure  - 1 x daily - 7 x weekly - 3 sets - 10 reps  PATIENT EDUCATION: Education details: [employer] HEP Person educated: Patient Education method: Programmer, multimedia, Demonstration, and Handouts Education comprehension: verbalized understanding and returned demonstration  HOME EXERCISE PROGRAM: [employer]  GOALS: Goals reviewed with patient? Yes  SHORT TERM GOALS: Target date: 11-26-23  Increase Lt knee active flexion to 95 degrees for increased mobility and ease of transfers, I.e. sit to stand and car transfers. Baseline: 90 degrees actively at end of eval session after performing exercises (5-22) Goal status: Goal met 11-11-23 (108 degrees actively)   2.  Increase Lt knee active extension to </= -18 degrees in supine position for reduced hamstring contracture and improved mobility and ROM of LLE. Baseline: -35 seated, -24 degrees in supine Goal status: INITIAL  3.  Pt will negotiate 4 steps with use  of bil. Hand rails using step by step sequence with SBA.  Baseline: TBA Goal status: Goal met 11-11-23  4.  Pt will improve TUG score to </= 16 secs with use of SPC to demonstrate improved functional mobility with reduced fall risk.   Baseline: 20.44 secs with RW Goal status: INITIAL  5.  Amb. 115' with SPC with CGA on flat, even surface for increased household accessibility with use of SPC. Baseline:  Goal status: INITIAL  6.  Independent in HEP for LLE strengthening, ROM, and balance. Baseline:  Goal status: INITIAL   LONG TERM GOALS: Target date: 12-24-23  Pt will ambulate 350' with SPC on even and uneven paved surfaces with supervision. Baseline: using RW Goal status: INITIAL  2.  Increase Lt knee active flexion to >/= 100 degrees  in seated position for improved flexibility & mobility of LLE. Baseline: 90 degrees actively at end of eval session after performing exercises (5-22) Goal status: INITIAL  3.  Increase Lt knee active extension to </= -15 degrees in supine position for reduced hamstring contracture and improved mobility and ROM of LLE. Baseline: -35 seated, -24 degrees in supine Goal status: INITIAL  4.  Improve TUG score to </= 14 secs with RW to demonstrate reduced fall risk and improved mobility. Baseline: 20.44 secs with RW Goal status: INITIAL  5.  Increase gait velocity to >/= 2.0 ft/sec with SPC for increased gait efficiency. Baseline: 1.19 ft/sec with RW Goal status: INITIAL  6.  Pt will report ability to return to working in her flowers with Lt knee discomfort </= 1/10 intensity. Baseline: 5/10 pain in Lt knee; pt is currently unable to work in her flowers Goal status: INITIAL  ASSESSMENT:  CLINICAL IMPRESSION: PT session focused on Lt knee musc. strengthening, Lt knee ROM and gait training with use of SPC.  Pt able to actively flex Lt knee in seated position to 108 degrees in today's session.  Pt has met STG's #1 and 3;  pt did very well with ambulation with use of SPC and used cane in Rt hand in today's session, not in Lt hand as she requested in previous PT session.  Pt is progressing well towards goals.  Cont with POC.   OBJECTIVE IMPAIRMENTS: decreased activity tolerance, decreased balance, difficulty walking, decreased ROM, decreased strength, and pain.   ACTIVITY LIMITATIONS: carrying, lifting, bending, standing, squatting, stairs, transfers, and locomotion level  PARTICIPATION LIMITATIONS: meal prep, cleaning, laundry, driving, shopping, community activity, and yard work  PERSONAL FACTORS: Age, Fitness, and 1 comorbidity: s/p Lt TKA are also affecting patient's functional outcome.   REHAB POTENTIAL: Good  CLINICAL DECISION MAKING: Evolving/moderate complexity  EVALUATION  COMPLEXITY: Moderate  PLAN:  PT FREQUENCY: 2x/week  PT DURATION: 8 weeks + eval  PLANNED INTERVENTIONS: 97110-Therapeutic exercises, 97530- Therapeutic activity, [id number]- Neuromuscular re-education, 97535- Self Care, 21308- Manual therapy, [phone]- Gait training, and [phone]- Aquatic Therapy  PLAN FOR NEXT SESSION:  continue Lt knee strengthening/ROM and gait training with SPC; balance training as tolerated to increase SLS on LLE   Kyuss Hale, Celeste Cola, PT 11/12/2023, 5:18 PM

## 2023-11-15 ENCOUNTER — Ambulatory Visit: Payer: Self-pay | Admitting: Physical Therapy

## 2023-11-15 ENCOUNTER — Encounter: Payer: Self-pay | Admitting: Physical Therapy

## 2023-11-15 DIAGNOSIS — R29898 Other symptoms and signs involving the musculoskeletal system: Secondary | ICD-10-CM

## 2023-11-15 DIAGNOSIS — M6281 Muscle weakness (generalized): Secondary | ICD-10-CM | POA: Diagnosis not present

## 2023-11-15 DIAGNOSIS — R2681 Unsteadiness on feet: Secondary | ICD-10-CM | POA: Diagnosis not present

## 2023-11-15 DIAGNOSIS — R262 Difficulty in walking, not elsewhere classified: Secondary | ICD-10-CM | POA: Diagnosis not present

## 2023-11-15 NOTE — Therapy (Signed)
 OUTPATIENT PHYSICAL THERAPY NEURO TREATMENT NOTE   Patient Name: Alexis Kramer MRN: [id number] DOB:1941-03-17, 83 y.o., female Today's Date: 11/15/2023   PCP: Victorio Grave, MD REFERRING PROVIDER: Dayne Even, MD  END OF SESSION:  PT End of Session - 11/15/23 1537     Visit Number 6    Number of Visits 17    Date for PT Re-Evaluation 12/24/23    Authorization Type [employer]    Authorization Time Period 10-28-23 - 01-06-24    PT Start Time 1535    PT Stop Time 1615    PT Time Calculation (min) 40 min    Equipment Utilized During Treatment Gait belt    Activity Tolerance Patient tolerated treatment well    Behavior During Therapy WFL for tasks assessed/performed                 Past Medical History:  Diagnosis Date   Anxiety    Arthritis    both knees   Breast cancer (HCC)    Cancer (HCC) 1994   breast cancer ; left side   Depression    situational   GERD (gastroesophageal reflux disease)    Pre-diabetes    Past Surgical History:  Procedure Laterality Date   ABDOMINAL HYSTERECTOMY     APPENDECTOMY     CRANIOTOMY N/A 04/14/2023   Procedure: Endoscopic endonasal resection of pituitary tumor;  Surgeon: Cannon Champion, MD;  Location: [facility] OR;  Service: Neurosurgery;  Laterality: N/A;   EYE SURGERY Bilateral 2021   MASTECTOMY Left 1997   TONSILLECTOMY     TOTAL KNEE ARTHROPLASTY Right 09/12/2019   Procedure: RIGHT TOTAL KNEE ARTHROPLASTY;  Surgeon: Dayne Even, MD;  Location: [facility] ORS;  Service: Orthopedics;  Laterality: Right;   TOTAL KNEE ARTHROPLASTY Left 10/05/2023   Procedure: ARTHROPLASTY, KNEE, TOTAL;  Surgeon: Dayne Even, MD;  Location: [facility] ORS;  Service: Orthopedics;  Laterality: Left;  LEFT TOTAL KNEE ARTHROPLASTY   TRANSNASAL APPROACH N/A 04/14/2023   Procedure: TRANSNASAL APPROACH;  Surgeon: Daleen Dubs, DO;  Location: [facility] OR;  Service: ENT;  Laterality: N/A;   Patient Active Problem List   Diagnosis Date Noted   Primary  osteoarthritis of left knee 10/05/2023   Status post transsphenoidal pituitary resection (HCC) 04/14/2023   Pituitary adenoma (HCC) 04/14/2023   Primary osteoarthritis of right knee 09/12/2019    ONSET DATE: 10-05-23  REFERRING DIAG:    S/P L total knee replacement    THERAPY DIAG:  Difficulty in walking, not elsewhere classified  Muscle weakness (generalized)  Other symptoms and signs involving the musculoskeletal system  Rationale for Evaluation and Treatment: Rehabilitation  SUBJECTIVE:  SUBJECTIVE STATEMENT:  Pt ambulates in with RW, no falls. Exercises are going ok when she feels like doing them.   Pt accompanied by: self  PERTINENT HISTORY: s/p Rt TKA (April 2021);  Lt TKA 10-05-23:  s/p craniotomy pituitary adenoma 04-14-23, h/o breast cancer   PAIN:  Are you having pain? No  PRECAUTIONS: None - no driving   RED FLAGS: None   WEIGHT BEARING RESTRICTIONS: No  FALLS: Has patient fallen in last 6 months? No  LIVING ENVIRONMENT: Lives with: lives alone Lives in: House/apartment Stairs: Yes: External: 5 steps; on right going up;  no steps at back door - using this entrance now Has following equipment at home: Single point cane, Walker - 2 wheeled, Marine scientist  PLOF: Independent with basic ADLs, Independent with household mobility without device, and Independent with community mobility with device  PATIENT GOALS: "be able to get out in my yard and work in my flowers"; walk without RW  OBJECTIVE:  Note: Objective measures were completed at Evaluation unless otherwise noted.  DIAGNOSTIC FINDINGS: N/A  COGNITION: Overall cognitive status: Within functional limits for tasks assessed   SENSATION: WFL  COORDINATION: WFL's RLE:  decreased speed of movements with LLE due to  c/o  knee pain s/p TKA  POSTURE: No Significant postural limitations  LOWER EXTREMITY ROM:     Active  Right Eval Left Eval  Hip flexion    Hip extension    Hip abduction    Hip adduction    Hip internal rotation    Hip external rotation    Knee flexion  85 in seated, 80 degrees in supine; 90 degrees in seated at end of session  Knee extension  -35 seated, -24 supine  Ankle dorsiflexion    Ankle plantarflexion    Ankle inversion    Ankle eversion     (Blank rows = not tested)  LOWER EXTREMITY MMT:    MMT Right Eval Left Eval  Hip flexion    Hip extension    Hip abduction    Hip adduction    Hip internal rotation    Hip external rotation    Knee flexion    Knee extension  4 (c/o mild pain with resistance)  Ankle dorsiflexion    Ankle plantarflexion    Ankle inversion    Ankle eversion    (Blank rows = not tested)  BED MOBILITY:  Findings: modified independent  TRANSFERS: Sit to stand: Modified independence  Assistive device utilized: bil. UE support from chair      STAIRS:  TBA  GAIT: Findings: Gait Characteristics: step through pattern, decreased hip/knee flexion- Left, and antalgic, Distance walked: 60', Assistive device utilized:Walker - 2 wheeled, Level of assistance: Modified independence, and Comments: antalgic gait pattern due to s/p Lt TKA  FUNCTIONAL TESTS:  5 times sit to stand: TBA Timed up and go (TUG): 20.44 secs 10 meter walk test: 27.63 secs =  1.19 ft/sec with RW Pt stood for 1" without UE support by side of mat table - LLE started tremoring after standing for approx. 40 secs  TREATMENT DATE: 11/15/23   Therapeutic Exercise:  SciFit Multi-Peaks Level 1.5 for 6 minutes for strengthening, ROM, activity tolerance. Pt reporting medium level of effort when performing. Pt needing a seated rest break afterwards due to fatigue    Seated LAQs with LLE 2 x 10 reps with 2# ankle weight, cued for 3 second hold, pt with decr knee extension ROM when performing   TherAct: Step training with use of bil. Hand rails - step over step sequence with ascension, step by step sequence with descension for 2 reps; performed additional 2 reps and used step over step sequence with ascension and descension - with use of bil. Rails with CGA with descending   Standing in // bars: Walking down and back 4 reps with no UE support, pt with decr stance time with LLE  10 reps step ups leading with LLE to 4" step with BUE support, then performed an additional 10 reps with LUE support, needs cues for L knee flexion when performing when stepping RLE back down to floor. Pt initially compensating with incr hip flexion and stepping RLE farther back to avoid bending LLE as much, performed an additional 10 reps with BUE support and to 6" step. Needs cues for incr hip/knee flexion with LLE instead of circumducting    Measured AROM at end of session in supine L knee extension: lacking 17 degrees  L knee flexion: 95 degrees    PATIENT EDUCATION: Education details: Continue HEP and importance of performing daily!  Person educated: Patient Education method: Explanation, Demonstration, and Handouts Education comprehension: verbalized understanding and returned demonstration  HOME EXERCISE PROGRAM: [employer] HEP Access Code: [email or web address] URL: [email or web address] Date: 10/28/2023 Prepared by: Johnnette Nakayama  Exercises - Supine Straight Leg Raises  - 1 x daily - 7 x weekly - 3 sets - 10 reps - Supine Heel Slides  - 1 x daily - 7 x weekly - 3 sets - 10 reps - Seated Long Arc Quad  - 1 x daily - 7 x weekly - 3 sets - 10 reps - 3-5 sec hold - Supine Short Arc Quad  - 1 x daily - 7 x weekly - 3 sets - 10 reps - 3 sec hold - Side Leg Lifts  - 1 x daily - 7 x weekly - 3 sets - 10 reps - Seated Knee Flexion Extension AAROM with Overpressure  - 1 x  daily - 7 x weekly - 3 sets - 10 reps  GOALS: Goals reviewed with patient? Yes  SHORT TERM GOALS: Target date: 11-26-23  Increase Lt knee active flexion to 95 degrees for increased mobility and ease of transfers, I.e. sit to stand and car transfers. Baseline: 90 degrees actively at end of eval session after performing exercises (5-22) Goal status: Goal met 11-11-23 (108 degrees actively)   2.  Increase Lt knee active extension to </= -18 degrees in supine position for reduced hamstring contracture and improved mobility and ROM of LLE. Baseline: -35 seated, -24 degrees in supine Goal status: INITIAL  3.  Pt will negotiate 4 steps with use of bil. Hand rails using step by step sequence with SBA.  Baseline: TBA Goal status: Goal met 11-11-23  4.  Pt will improve TUG score to </= 16 secs with use of SPC to demonstrate improved functional mobility with reduced fall risk.   Baseline: 20.44 secs with RW Goal status: INITIAL  5.  Amb. 115' with SPC with CGA on flat, even surface for increased household accessibility with use  of SPC. Baseline:  Goal status: INITIAL  6.  Independent in HEP for LLE strengthening, ROM, and balance. Baseline:  Goal status: INITIAL   LONG TERM GOALS: Target date: 12-24-23  Pt will ambulate 350' with SPC on even and uneven paved surfaces with supervision. Baseline: using RW Goal status: INITIAL  2.  Increase Lt knee active flexion to >/= 100 degrees in seated position for improved flexibility & mobility of LLE. Baseline: 90 degrees actively at end of eval session after performing exercises (5-22) Goal status: INITIAL  3.  Increase Lt knee active extension to </= -15 degrees in supine position for reduced hamstring contracture and improved mobility and ROM of LLE. Baseline: -35 seated, -24 degrees in supine Goal status: INITIAL  4.  Improve TUG score to </= 14 secs with RW to demonstrate reduced fall risk and improved mobility. Baseline: 20.44 secs with  RW Goal status: INITIAL  5.  Increase gait velocity to >/= 2.0 ft/sec with SPC for increased gait efficiency. Baseline: 1.19 ft/sec with RW Goal status: INITIAL  6.  Pt will report ability to return to working in her flowers with Lt knee discomfort </= 1/10 intensity. Baseline: 5/10 pain in Lt knee; pt is currently unable to work in her flowers Goal status: INITIAL  ASSESSMENT:  CLINICAL IMPRESSION: Today's skilled session focused on LLE strengthening, ROM, and stair training. In supine at end of session, pt only able to achieve 95 deg of AROM. In supine, pt limited by  17 degrees of extension (at eval pt was limited by 24 deg). With step up training, pt needing cues to flex L knee when stepping RLE back down to the floor. Continued to work on stair training with pt safer descending with step to pattern instead of alternating pattern. Pt needing CGA when descending with alternating pattern. Will continue per POC.   OBJECTIVE IMPAIRMENTS: decreased activity tolerance, decreased balance, difficulty walking, decreased ROM, decreased strength, and pain.   ACTIVITY LIMITATIONS: carrying, lifting, bending, standing, squatting, stairs, transfers, and locomotion level  PARTICIPATION LIMITATIONS: meal prep, cleaning, laundry, driving, shopping, community activity, and yard work  PERSONAL FACTORS: Age, Fitness, and 1 comorbidity: s/p Lt TKA are also affecting patient's functional outcome.   REHAB POTENTIAL: Good  CLINICAL DECISION MAKING: Evolving/moderate complexity  EVALUATION COMPLEXITY: Moderate  PLAN:  PT FREQUENCY: 2x/week  PT DURATION: 8 weeks + eval  PLANNED INTERVENTIONS: 97110-Therapeutic exercises, 97530- Therapeutic activity, [id number]- Neuromuscular re-education, 97535- Self Care, 16109- Manual therapy, [id number]- Gait training, and [phone]- Aquatic Therapy  PLAN FOR NEXT SESSION:  continue Lt knee strengthening/ROM and gait training with SPC; balance training as tolerated to increase  SLS on LLE   Loleta Frommelt N Ashvin Adelson, PT, DPT 11/15/2023, 4:27 PM

## 2023-11-18 ENCOUNTER — Ambulatory Visit: Admitting: Physical Therapy

## 2023-11-18 DIAGNOSIS — R29898 Other symptoms and signs involving the musculoskeletal system: Secondary | ICD-10-CM | POA: Diagnosis not present

## 2023-11-18 DIAGNOSIS — R2681 Unsteadiness on feet: Secondary | ICD-10-CM | POA: Diagnosis not present

## 2023-11-18 DIAGNOSIS — R262 Difficulty in walking, not elsewhere classified: Secondary | ICD-10-CM | POA: Diagnosis not present

## 2023-11-18 DIAGNOSIS — M6281 Muscle weakness (generalized): Secondary | ICD-10-CM | POA: Diagnosis not present

## 2023-11-18 NOTE — Therapy (Signed)
 OUTPATIENT PHYSICAL THERAPY NEURO TREATMENT NOTE   Patient Name: KHYLIE LARMORE MRN: [id number] DOB:1941/04/16, 83 y.o., female Today's Date: 11/19/2023   PCP: Victorio Grave, MD REFERRING PROVIDER: Dayne Even, MD  END OF SESSION:  PT End of Session - 11/19/23 1520     Visit Number 7    Number of Visits 17    Date for PT Re-Evaluation 12/24/23    Authorization Type [employer]    Authorization Time Period 10-28-23 - 01-06-24    PT Start Time 1402    PT Stop Time 1446    PT Time Calculation (min) 44 min    Equipment Utilized During Treatment Gait belt    Activity Tolerance Patient tolerated treatment well    Behavior During Therapy WFL for tasks assessed/performed               Past Medical History:  Diagnosis Date   Anxiety    Arthritis    both knees   Breast cancer (HCC)    Cancer (HCC) 1994   breast cancer ; left side   Depression    situational   GERD (gastroesophageal reflux disease)    Pre-diabetes    Past Surgical History:  Procedure Laterality Date   ABDOMINAL HYSTERECTOMY     APPENDECTOMY     CRANIOTOMY N/A 04/14/2023   Procedure: Endoscopic endonasal resection of pituitary tumor;  Surgeon: Cannon Champion, MD;  Location: [facility] OR;  Service: Neurosurgery;  Laterality: N/A;   EYE SURGERY Bilateral 2021   MASTECTOMY Left 1997   TONSILLECTOMY     TOTAL KNEE ARTHROPLASTY Right 09/12/2019   Procedure: RIGHT TOTAL KNEE ARTHROPLASTY;  Surgeon: Dayne Even, MD;  Location: [facility] ORS;  Service: Orthopedics;  Laterality: Right;   TOTAL KNEE ARTHROPLASTY Left 10/05/2023   Procedure: ARTHROPLASTY, KNEE, TOTAL;  Surgeon: Dayne Even, MD;  Location: [facility] OR[facility];  Service: Orthopedics;  Laterality: Left;  LEFT TOTAL KNEE ARTHROPLASTY   TRANSNASAL APPROACH N/A 04/14/2023   Procedure: TRANSNASAL APPROACH;  Surgeon: Daleen Dubs, DO;  Location: [facility] OR;  Service: ENT;  Laterality: N/A;   Patient Active Problem List   Diagnosis Date Noted   Primary  osteoarthritis of left knee 10/05/2023   Status post transsphenoidal pituitary resection (HCC) 04/14/2023   Pituitary adenoma (HCC) 04/14/2023   Primary osteoarthritis of right knee 09/12/2019    ONSET DATE: 10-05-23  REFERRING DIAG:    S/P L total knee replacement    THERAPY DIAG:  Difficulty in walking, not elsewhere classified  Muscle weakness (generalized)  Other symptoms and signs involving the musculoskeletal system  Unsteadiness on feet  Rationale for Evaluation and Treatment: Rehabilitation  SUBJECTIVE:  SUBJECTIVE STATEMENT: Pt ambulating to PT with use of SPC for 1st time today - pt reports she has been walking a lot more in her home with the cane and thought she would use it to come to PT today.  Pt reports her grandson came over yesterday (Wed.) to help her with her HEP.  Pt noted to have some edema in her Lt knee - recommended pt to use ice pack for approx. 10, 1-2x/day Pt verbalized understanding and agreement  Pt accompanied by: self  PERTINENT HISTORY: s/p Rt TKA (April 2021);  Lt TKA 10-05-23:  s/p craniotomy pituitary adenoma 04-14-23, h/o breast cancer   PAIN:  Are you having pain? Yes: NPRS scale: 3-4/10 Pain location: Lt knee Pain description: throbbing Aggravating factors: prolonged sitting Relieving factors: Tylenol  and pain medication only if needed  PRECAUTIONS: None - no driving   RED FLAGS: None   WEIGHT BEARING RESTRICTIONS: No  FALLS: Has patient fallen in last 6 months? No  LIVING ENVIRONMENT: Lives with: lives alone Lives in: House/apartment Stairs: Yes: External: 5 steps; on right going up;  no steps at back door - using this entrance now Has following equipment at home: Single point cane, Walker - 2 wheeled, Marine scientist  PLOF: Independent  with basic ADLs, Independent with household mobility without device, and Independent with community mobility with device  PATIENT GOALS: be able to get out in my yard and work in my flowers; walk without RW  OBJECTIVE:  Note: Objective measures were completed at Evaluation unless otherwise noted.  DIAGNOSTIC FINDINGS: N/A  COGNITION: Overall cognitive status: Within functional limits for tasks assessed   SENSATION: WFL  COORDINATION: WFL's RLE:  decreased speed of movements with LLE due to c/o  knee pain s/p TKA  POSTURE: No Significant postural limitations  LOWER EXTREMITY ROM:     Active  Right Eval Left Eval  Hip flexion    Hip extension    Hip abduction    Hip adduction    Hip internal rotation    Hip external rotation    Knee flexion  85 in seated, 80 degrees in supine; 90 degrees in seated at end of session  Knee extension  -35 seated, -24 supine  Ankle dorsiflexion    Ankle plantarflexion    Ankle inversion    Ankle eversion     (Blank rows = not tested)  LOWER EXTREMITY MMT:    MMT Right Eval Left Eval  Hip flexion    Hip extension    Hip abduction    Hip adduction    Hip internal rotation    Hip external rotation    Knee flexion    Knee extension  4 (c/o mild pain with resistance)  Ankle dorsiflexion    Ankle plantarflexion    Ankle inversion    Ankle eversion    (Blank rows = not tested)  BED MOBILITY:  Findings: modified independent  TRANSFERS: Sit to stand: Modified independence  Assistive device utilized: bil. UE support from chair      STAIRS:  TBA  GAIT: Findings: Gait Characteristics: step through pattern, decreased hip/knee flexion- Left, and antalgic, Distance walked: 60', Assistive device utilized:Walker - 2 wheeled, Level of assistance: Modified independence, and Comments: antalgic gait pattern due to s/p Lt TKA  FUNCTIONAL TESTS:  5 times sit to stand: TBA Timed up and go (TUG): 20.44 secs 10 meter walk test: 27.63 secs =   1.19 ft/sec with RW Pt stood for 1 without UE support  by side of mat table - LLE started tremoring after standing for approx. 40 secs                                                                                                                             TREATMENT DATE: 11-18-23   Gait: Pt amb. With [facility] modified independently  Gait train with use of SPC in Rt hand 115'; noted that pt has decreased Lt initial heel contact in stance - instructed pt to try to place Lt heel down first when stepping with LLE - gait trained inside // bars for pt to have visual feedback with use of mirror  Step training with use of bil. Hand rails - step over step sequence with ascension & descension - 3 reps for total of 12 steps negotiated - with use of bil. Rails with supervision   TherEx:  Stretches for Lt hamstring and heel cord - runner's stretch at counter for LLE - 15 sec hold x 1 rep; standing Lt heel cord stretch using wedged block - approx. 3 height - LLE 15 sec hold x 1 rep; informed pt that she could use bottom shelf of cabinet for convenience with this stretch at home  In seated position in chair:  pt performed active assisted Lt knee flexion using her RLE to push LLE into > knee flexion -- contract/relax with Lt knee held in flexed position - 10 reps with 3 sec hold  Lt knee active flexion = 108 degrees  Lt lower leg placed on stool - pt performed quad sets 10 reps with 3 sec hold  In supine position: LLE -Lt heel slides 10 reps to increase Lt knee flexion - no weight -SLR 2# (placed above knee) 10 reps:  10 reps 2# weight positioned at ankle -Quad sets LLE 10 reps:  5# weight placed above knee to increase passive extension - small white bolster placed under pt's Lt lower leg SAQ's LLE 3# - 15 reps - used blue bolster LAQ's 3# 10 reps in seated position on side of mat  Lt knee flexion in supine - 98 degrees Lt knee flexion in seated position - 110 degrees - at end of session  Access Code:  [email or web address] - Updated HEP - 2 stretches URL: [email or web address] Date: 11/19/2023 Prepared by: Johnnette Nakayama  Exercises - Standing Gastroc Stretch  - 1 x daily - 7 x weekly - 1 sets - 1-2 reps - 15-20 hold - Standing Bilateral Gastroc Stretch with Step  - 1 x daily - 7 x weekly - 1 sets - 2-3 reps - 20-30 hold   TherAct: Standing LLE closed chain strengthening/weight bearing activities inside // bars: Tap ups to 4 step performed with RLE with min. Bil. UE support on // bars; 10 reps - for increased LLE weight bearing Step ups onto 4 step with LLE for quad strengthening - with bil. UE support on bars- 10 reps Marching in place 10 reps each leg  with minimal UE support on // bars    Medbridge HEP Access Code: [email or web address] URL: [email or web address] Date: 10/28/2023 Prepared by: Johnnette Nakayama  Exercises - Supine Straight Leg Raises  - 1 x daily - 7 x weekly - 3 sets - 10 reps - Supine Heel Slides  - 1 x daily - 7 x weekly - 3 sets - 10 reps - Seated Long Arc Quad  - 1 x daily - 7 x weekly - 3 sets - 10 reps - 3-5 sec hold - Supine Short Arc Quad  - 1 x daily - 7 x weekly - 3 sets - 10 reps - 3 sec hold - Side Leg Lifts  - 1 x daily - 7 x weekly - 3 sets - 10 reps - Seated Knee Flexion Extension AAROM with Overpressure  - 1 x daily - 7 x weekly - 3 sets - 10 reps  PATIENT EDUCATION: Education details: Medbridge HEP Person educated: Patient Education method: Programmer, multimedia, Demonstration, and Handouts Education comprehension: verbalized understanding and returned demonstration  HOME EXERCISE PROGRAM: Medbridge  GOALS: Goals reviewed with patient? Yes  SHORT TERM GOALS: Target date: 11-26-23  Increase Lt knee active flexion to 95 degrees for increased mobility and ease of transfers, I.e. sit to stand and car transfers. Baseline: 90 degrees actively at end of eval session after performing exercises (5-22) Goal status: Goal met 11-11-23 (108 degrees actively)    2.  Increase Lt knee active extension to </= -18 degrees in supine position for reduced hamstring contracture and improved mobility and ROM of LLE. Baseline: -35 seated, -24 degrees in supine Goal status: INITIAL  3.  Pt will negotiate 4 steps with use of bil. Hand rails using step by step sequence with SBA.  Baseline: TBA Goal status: Goal met 11-11-23  4.  Pt will improve TUG score to </= 16 secs with use of SPC to demonstrate improved functional mobility with reduced fall risk.   Baseline: 20.44 secs with RW Goal status: INITIAL  5.  Amb. 115' with SPC with CGA on flat, even surface for increased household accessibility with use of SPC. Baseline:  Goal status: INITIAL  6.  Independent in HEP for LLE strengthening, ROM, and balance. Baseline:  Goal status: INITIAL   LONG TERM GOALS: Target date: 12-24-23  Pt will ambulate 350' with SPC on even and uneven paved surfaces with supervision. Baseline: using RW Goal status: INITIAL  2.  Increase Lt knee active flexion to >/= 100 degrees in seated position for improved flexibility & mobility of LLE. Baseline: 90 degrees actively at end of eval session after performing exercises (5-22) Goal status: INITIAL  3.  Increase Lt knee active extension to </= -15 degrees in supine position for reduced hamstring contracture and improved mobility and ROM of LLE. Baseline: -35 seated, -24 degrees in supine Goal status: INITIAL  4.  Improve TUG score to </= 14 secs with RW to demonstrate reduced fall risk and improved mobility. Baseline: 20.44 secs with RW Goal status: INITIAL  5.  Increase gait velocity to >/= 2.0 ft/sec with SPC for increased gait efficiency. Baseline: 1.19 ft/sec with RW Goal status: INITIAL  6.  Pt will report ability to return to working in her flowers with Lt knee discomfort </= 1/10 intensity. Baseline: 5/10 pain in Lt knee; pt is currently unable to work in her flowers Goal status:  INITIAL  ASSESSMENT:  CLINICAL IMPRESSION: PT session focused on Lt knee AROM and strengthening exercises.  Pt demonstrated increased strength  in Lt quads by performing SAQ's & LAQ's with 3# weight in today's session.  Lt knee flexion in supine measured 98 degrees and in seated position 110 degrees after much stretching with contract/relax and active assisted flexion in seated position.  Added Lt hamstrings and heel cord stretches to HEP.  Pt noted to have decreased Lt initial heel contact in stance phase of gait - able to increase with verbal and visual cues.  Cont with POC.   OBJECTIVE IMPAIRMENTS: decreased activity tolerance, decreased balance, difficulty walking, decreased ROM, decreased strength, and pain.   ACTIVITY LIMITATIONS: carrying, lifting, bending, standing, squatting, stairs, transfers, and locomotion level  PARTICIPATION LIMITATIONS: meal prep, cleaning, laundry, driving, shopping, community activity, and yard work  PERSONAL FACTORS: Age, Fitness, and 1 comorbidity: s/p Lt TKA are also affecting patient's functional outcome.   REHAB POTENTIAL: Good  CLINICAL DECISION MAKING: Evolving/moderate complexity  EVALUATION COMPLEXITY: Moderate  PLAN:  PT FREQUENCY: 2x/week  PT DURATION: 8 weeks + eval  PLANNED INTERVENTIONS: 97110-Therapeutic exercises, 97530- Therapeutic activity, [id number]- Neuromuscular re-education, 97535- Self Care, 16109- Manual therapy, [id number]- Gait training, and [phone]- Aquatic Therapy  PLAN FOR NEXT SESSION:  how did the 2 stretches go at home? (Added runner's stretch and standing heel cord stretch to HEP on 11-18-23) continue Lt knee strengthening/ROM and gait training with SPC; balance training as tolerated to increase SLS on LLE   Detavious Rinn, Celeste Cola, PT 11/19/2023, 3:22 PM

## 2023-11-19 ENCOUNTER — Ambulatory Visit: Payer: Self-pay | Admitting: Physical Therapy

## 2023-11-19 ENCOUNTER — Encounter: Payer: Self-pay | Admitting: Physical Therapy

## 2023-11-22 ENCOUNTER — Ambulatory Visit: Payer: Self-pay | Admitting: Physical Therapy

## 2023-11-22 ENCOUNTER — Encounter: Payer: Self-pay | Admitting: Physical Therapy

## 2023-11-22 DIAGNOSIS — R29898 Other symptoms and signs involving the musculoskeletal system: Secondary | ICD-10-CM

## 2023-11-22 DIAGNOSIS — M6281 Muscle weakness (generalized): Secondary | ICD-10-CM | POA: Diagnosis not present

## 2023-11-22 DIAGNOSIS — R2681 Unsteadiness on feet: Secondary | ICD-10-CM | POA: Diagnosis not present

## 2023-11-22 DIAGNOSIS — R262 Difficulty in walking, not elsewhere classified: Secondary | ICD-10-CM | POA: Diagnosis not present

## 2023-11-22 NOTE — Therapy (Signed)
 OUTPATIENT PHYSICAL THERAPY NEURO TREATMENT NOTE   Patient Name: Alexis Kramer MRN: [id number] DOB:November 20, 1940, 83 y.o., female Today's Date: 11/22/2023   PCP: Victorio Grave, MD REFERRING PROVIDER: Dayne Even, MD  END OF SESSION:  PT End of Session - 11/22/23 1400     Visit Number 8    Number of Visits 17    Date for PT Re-Evaluation 12/24/23    Authorization Type [employer] Medicare    Authorization Time Period 10-28-23 - 01-06-24    PT Start Time 1359    PT Stop Time 1441    PT Time Calculation (min) 42 min    Equipment Utilized During Treatment Gait belt    Activity Tolerance Patient tolerated treatment well    Behavior During Therapy WFL for tasks assessed/performed               Past Medical History:  Diagnosis Date   Anxiety    Arthritis    both knees   Breast cancer (HCC)    Cancer (HCC) 1994   breast cancer ; left side   Depression    situational   GERD (gastroesophageal reflux disease)    Pre-diabetes    Past Surgical History:  Procedure Laterality Date   ABDOMINAL HYSTERECTOMY     APPENDECTOMY     CRANIOTOMY N/A 04/14/2023   Procedure: Endoscopic endonasal resection of pituitary tumor;  Surgeon: Cannon Champion, MD;  Location: [facility] OR;  Service: Neurosurgery;  Laterality: N/A;   EYE SURGERY Bilateral 2021   MASTECTOMY Left 1997   TONSILLECTOMY     TOTAL KNEE ARTHROPLASTY Right 09/12/2019   Procedure: RIGHT TOTAL KNEE ARTHROPLASTY;  Surgeon: Dayne Even, MD;  Location: [facility] ORS;  Service: Orthopedics;  Laterality: Right;   TOTAL KNEE ARTHROPLASTY Left 10/05/2023   Procedure: ARTHROPLASTY, KNEE, TOTAL;  Surgeon: Dayne Even, MD;  Location: [facility] ORS;  Service: Orthopedics;  Laterality: Left;  LEFT TOTAL KNEE ARTHROPLASTY   TRANSNASAL APPROACH N/A 04/14/2023   Procedure: TRANSNASAL APPROACH;  Surgeon: Daleen Dubs, DO;  Location: [facility] OR;  Service: ENT;  Laterality: N/A;   Patient Active Problem List   Diagnosis Date Noted   Primary  osteoarthritis of left knee 10/05/2023   Status post transsphenoidal pituitary resection (HCC) 04/14/2023   Pituitary adenoma (HCC) 04/14/2023   Primary osteoarthritis of right knee 09/12/2019    ONSET DATE: 10-05-23  REFERRING DIAG:    S/P L total knee replacement    THERAPY DIAG:  Difficulty in walking, not elsewhere classified  Muscle weakness (generalized)  Other symptoms and signs involving the musculoskeletal system  Unsteadiness on feet  Rationale for Evaluation and Treatment: Rehabilitation  SUBJECTIVE:  SUBJECTIVE STATEMENT: Pt ambulating to PT with use of SPC for 1st time today - holding it in her L hand. Forgot to ice her L knee it. Reminder pt of this and to also elevate it. Needs to cancel PT appt on Wednesday - getting her AC fixed   Pt accompanied by: self  PERTINENT HISTORY: s/p Rt TKA (April 2021);  Lt TKA 10-05-23:  s/p craniotomy pituitary adenoma 04-14-23, h/o breast cancer   PAIN:  Are you having pain? Yes: NPRS scale: 5/10 Pain location: Lt knee Pain description: throbbing Aggravating factors: prolonged sitting Relieving factors: Tylenol  and pain medication only if needed  PRECAUTIONS: None - no driving   RED FLAGS: None   WEIGHT BEARING RESTRICTIONS: No  FALLS: Has patient fallen in last 6 months? No  LIVING ENVIRONMENT: Lives with: lives alone Lives in: House/apartment Stairs: Yes: External: 5 steps; on right going up;  no steps at back door - using this entrance now Has following equipment at home: Single point cane, Walker - 2 wheeled, Marine scientist  PLOF: Independent with basic ADLs, Independent with household mobility without device, and Independent with community mobility with device  PATIENT GOALS: be able to get out in my yard and work in my  flowers; walk without RW  OBJECTIVE:  Note: Objective measures were completed at Evaluation unless otherwise noted.  DIAGNOSTIC FINDINGS: N/A  COGNITION: Overall cognitive status: Within functional limits for tasks assessed   SENSATION: WFL  COORDINATION: WFL's RLE:  decreased speed of movements with LLE due to c/o  knee pain s/p TKA  POSTURE: No Significant postural limitations  LOWER EXTREMITY ROM:     Active  Right Eval Left Eval  Hip flexion    Hip extension    Hip abduction    Hip adduction    Hip internal rotation    Hip external rotation    Knee flexion  85 in seated, 80 degrees in supine; 90 degrees in seated at end of session  Knee extension  -35 seated, -24 supine  Ankle dorsiflexion    Ankle plantarflexion    Ankle inversion    Ankle eversion     (Blank rows = not tested)  LOWER EXTREMITY MMT:    MMT Right Eval Left Eval  Hip flexion    Hip extension    Hip abduction    Hip adduction    Hip internal rotation    Hip external rotation    Knee flexion    Knee extension  4 (c/o mild pain with resistance)  Ankle dorsiflexion    Ankle plantarflexion    Ankle inversion    Ankle eversion    (Blank rows = not tested)  BED MOBILITY:  Findings: modified independent  TRANSFERS: Sit to stand: Modified independence  Assistive device utilized: bil. UE support from chair      STAIRS:  TBA  GAIT: Findings: Gait Characteristics: step through pattern, decreased hip/knee flexion- Left, and antalgic, Distance walked: 60', Assistive device utilized:Walker - 2 wheeled, Level of assistance: Modified independence, and Comments: antalgic gait pattern due to s/p Lt TKA  FUNCTIONAL TESTS:  5 times sit to stand: TBA Timed up and go (TUG): 20.44 secs 10 meter walk test: 27.63 secs =  1.19 ft/sec with RW Pt stood for 1 without UE support by side of mat table - LLE started tremoring after standing for approx. 40 secs  TREATMENT DATE: 11/22/23  Therapeutic Activity:  Pt amb. With [facility] modified independently, initial cues to hold cane in R hand vs. L hand. Educated on reasoning to hold cane in this hand vs. R hand. Cues for heel strike with LLE, pt did well with this. Pt reporting feeling better with gait when holding cane in R hand   At end of session R knee AROM:  Supine knee flexion: 96 degrees, with over pressure can get to 100 degrees  Sitting knee flexion: 113 degrees  Therapeutic Exercise:  SciFit Multi-Peaks Level 3.0 for 8 minutes for strengthening, ROM, activity tolerance. Pt reporting medium level of effort when performing. Pt reporting RPE as 9/10. When asked to turn down resistance, pt reports she would rather keep this resistance.   Reviewed stretches that were added at previous appt:  Runner's stretch at countertop - 2 x 30 seconds with LLE, cues for knee extension and pressing heel to the ground, pt reporting incr stiffness with this stretch  Heel cord stretch at staircase instead of bottom shelf of cabinet (as pt unable to do this at the bottom of her countertop), 3 x 30 seconds, pt reports that she has a staircase at home with bilateral handrails that she can perform outside   Prone hamstring curl with no weight 10 reps with gentle over pressure at end range into knee flexion, performed an additional 10 reps with 2# ankle weight, pt with incr difficulty performing tis way and incr pain   Supine straight leg raise with no ankle weight 10 reps, needs verbal and tactile cues for quad set first as pt initially performing with just hip flexion   PATIENT EDUCATION: Education details: Continue HEP  Person educated: Patient Education method: Explanation, Demonstration, and Handouts Education comprehension: verbalized understanding and returned demonstration  HOME EXERCISE PROGRAM: Medbridge Medbridge HEP Access Code:  [email or web address] URL: [email or web address] Date: 10/28/2023 Prepared by: Johnnette Nakayama  Exercises - Supine Straight Leg Raises  - 1 x daily - 7 x weekly - 3 sets - 10 reps - Supine Heel Slides  - 1 x daily - 7 x weekly - 3 sets - 10 reps - Seated Long Arc Quad  - 1 x daily - 7 x weekly - 3 sets - 10 reps - 3-5 sec hold - Supine Short Arc Quad  - 1 x daily - 7 x weekly - 3 sets - 10 reps - 3 sec hold - Side Leg Lifts  - 1 x daily - 7 x weekly - 3 sets - 10 reps - Seated Knee Flexion Extension AAROM with Overpressure  - 1 x daily - 7 x weekly - 3 sets - 10 reps   Access Code: [email or web address] - Updated HEP - 2 stretches URL: [email or web address] Date: 11/19/2023 Prepared by: Johnnette Nakayama  Exercises - Standing Gastroc Stretch  - 1 x daily - 7 x weekly - 1 sets - 1-2 reps - 15-20 hold - Standing Bilateral Gastroc Stretch with Step  - 1 x daily - 7 x weekly - 1 sets - 2-3 reps - 20-30 hold  GOALS: Goals reviewed with patient? Yes  SHORT TERM GOALS: Target date: 11-26-23  Increase Lt knee active flexion to 95 degrees for increased mobility and ease of transfers, I.e. sit to stand and car transfers. Baseline: 90 degrees actively at end of eval session after performing exercises (5-22) Goal status: Goal met 11-11-23 (108 degrees actively)   2.  Increase Lt knee active extension to </= -18 degrees in supine position  for reduced hamstring contracture and improved mobility and ROM of LLE. Baseline: -35 seated, -24 degrees in supine Goal status: INITIAL  3.  Pt will negotiate 4 steps with use of bil. Hand rails using step by step sequence with SBA.  Baseline: TBA Goal status: Goal met 11-11-23  4.  Pt will improve TUG score to </= 16 secs with use of SPC to demonstrate improved functional mobility with reduced fall risk.   Baseline: 20.44 secs with RW Goal status: INITIAL  5.  Amb. 115' with SPC with CGA on flat, even surface for increased household accessibility with use of  SPC. Baseline:  Goal status: INITIAL  6.  Independent in HEP for LLE strengthening, ROM, and balance. Baseline:  Goal status: INITIAL   LONG TERM GOALS: Target date: 12-24-23  Pt will ambulate 350' with SPC on even and uneven paved surfaces with supervision. Baseline: using RW Goal status: INITIAL  2.  Increase Lt knee active flexion to >/= 100 degrees in seated position for improved flexibility & mobility of LLE. Baseline: 90 degrees actively at end of eval session after performing exercises (5-22) Goal status: INITIAL  3.  Increase Lt knee active extension to </= -15 degrees in supine position for reduced hamstring contracture and improved mobility and ROM of LLE. Baseline: -35 seated, -24 degrees in supine Goal status: INITIAL  4.  Improve TUG score to </= 14 secs with RW to demonstrate reduced fall risk and improved mobility. Baseline: 20.44 secs with RW Goal status: INITIAL  5.  Increase gait velocity to >/= 2.0 ft/sec with SPC for increased gait efficiency. Baseline: 1.19 ft/sec with RW Goal status: INITIAL  6.  Pt will report ability to return to working in her flowers with Lt knee discomfort </= 1/10 intensity. Baseline: 5/10 pain in Lt knee; pt is currently unable to work in her flowers Goal status: INITIAL  ASSESSMENT:  CLINICAL IMPRESSION: Today's skilled session continued to focus on L knee AROM and strengthening tasks. Pt ambulated into clinic again today with SPC, needing initial cues to hold cane in RUE compared to L. Pt reporting improved gait when changing hand that she was holding cane in. Pt with slight improvement in L knee flexion today in supine and sitting position. Continued to educate on importance of performing exercises daily for strength/ROM. Will continue per POC.     OBJECTIVE IMPAIRMENTS: decreased activity tolerance, decreased balance, difficulty walking, decreased ROM, decreased strength, and pain.   ACTIVITY LIMITATIONS: carrying, lifting,  bending, standing, squatting, stairs, transfers, and locomotion level  PARTICIPATION LIMITATIONS: meal prep, cleaning, laundry, driving, shopping, community activity, and yard work  PERSONAL FACTORS: Age, Fitness, and 1 comorbidity: s/p Lt TKA are also affecting patient's functional outcome.   REHAB POTENTIAL: Good  CLINICAL DECISION MAKING: Evolving/moderate complexity  EVALUATION COMPLEXITY: Moderate  PLAN:  PT FREQUENCY: 2x/week  PT DURATION: 8 weeks + eval  PLANNED INTERVENTIONS: 97110-Therapeutic exercises, 97530- Therapeutic activity, [id number]- Neuromuscular re-education, 97535- Self Care, [id number]- Manual therapy, [phone]- Gait training, and [phone]- Aquatic Therapy  PLAN FOR NEXT SESSION:   continue Lt knee strengthening/ROM and gait training with SPC; balance training as tolerated to increase SLS on LLE  CHECK STGS!!    Seabron Cypress, PT, DPT 11/22/2023, 2:44 PM

## 2023-11-24 ENCOUNTER — Ambulatory Visit: Admitting: Physical Therapy

## 2023-11-24 ENCOUNTER — Encounter: Payer: Self-pay | Admitting: Physical Therapy

## 2023-11-24 ENCOUNTER — Ambulatory Visit: Payer: Self-pay | Admitting: Physical Therapy

## 2023-11-24 DIAGNOSIS — R29898 Other symptoms and signs involving the musculoskeletal system: Secondary | ICD-10-CM

## 2023-11-24 DIAGNOSIS — R262 Difficulty in walking, not elsewhere classified: Secondary | ICD-10-CM

## 2023-11-24 DIAGNOSIS — R2681 Unsteadiness on feet: Secondary | ICD-10-CM

## 2023-11-24 DIAGNOSIS — M6281 Muscle weakness (generalized): Secondary | ICD-10-CM | POA: Diagnosis not present

## 2023-11-24 NOTE — Therapy (Signed)
 OUTPATIENT PHYSICAL THERAPY NEURO TREATMENT NOTE   Patient Name: Alexis Kramer MRN: [id number] DOB:[birth year], 83 y.o., female Today's Date: 11/24/2023   PCP: Victorio Grave, MD REFERRING PROVIDER: Dayne Even, MD  END OF SESSION:  PT End of Session - 11/24/23 1545     Visit Number 9    Number of Visits 17    Date for PT Re-Evaluation 12/24/23    Authorization Type [employer] Medicare    Authorization Time Period 10-28-23 - 01-06-24    PT Start Time 1545   pt late to session   PT Stop Time 1615    PT Time Calculation (min) 30 min    Equipment Utilized During Treatment Gait belt    Activity Tolerance Patient tolerated treatment well    Behavior During Therapy WFL for tasks assessed/performed               Past Medical History:  Diagnosis Date   Anxiety    Arthritis    both knees   Breast cancer (HCC)    Cancer (HCC) 1994   breast cancer ; left side   Depression    situational   GERD (gastroesophageal reflux disease)    Pre-diabetes    Past Surgical History:  Procedure Laterality Date   ABDOMINAL HYSTERECTOMY     APPENDECTOMY     CRANIOTOMY N/A 04/14/2023   Procedure: Endoscopic endonasal resection of pituitary tumor;  Surgeon: Cannon Champion, MD;  Location: [facility] OR;  Service: Neurosurgery;  Laterality: N/A;   EYE SURGERY Bilateral 2021   MASTECTOMY Left 1997   TONSILLECTOMY     TOTAL KNEE ARTHROPLASTY Right 09/12/2019   Procedure: RIGHT TOTAL KNEE ARTHROPLASTY;  Surgeon: Dayne Even, MD;  Location: [facility] ORS;  Service: Orthopedics;  Laterality: Right;   TOTAL KNEE ARTHROPLASTY Left 10/05/2023   Procedure: ARTHROPLASTY, KNEE, TOTAL;  Surgeon: Dayne Even, MD;  Location: [facility] ORS;  Service: Orthopedics;  Laterality: Left;  LEFT TOTAL KNEE ARTHROPLASTY   TRANSNASAL APPROACH N/A 04/14/2023   Procedure: TRANSNASAL APPROACH;  Surgeon: Daleen Dubs, DO;  Location: [facility] OR;  Service: ENT;  Laterality: N/A;   Patient Active Problem List   Diagnosis Date  Noted   Primary osteoarthritis of left knee 10/05/2023   Status post transsphenoidal pituitary resection (HCC) 04/14/2023   Pituitary adenoma (HCC) 04/14/2023   Primary osteoarthritis of right knee 09/12/2019    ONSET DATE: 10-05-23  REFERRING DIAG:    S/P L total knee replacement    THERAPY DIAG:  Difficulty in walking, not elsewhere classified  Muscle weakness (generalized)  Other symptoms and signs involving the musculoskeletal system  Unsteadiness on feet  Rationale for Evaluation and Treatment: Rehabilitation  SUBJECTIVE:  SUBJECTIVE STATEMENT: Has to leave appt by 4:15 today - there will be someone coming to her house to get her A/C fixed. Reports sometimes not using her cane at home when ambulating.   Pt accompanied by: self  PERTINENT HISTORY: s/p Rt TKA (April 2021);  Lt TKA 10-05-23:  s/p craniotomy pituitary adenoma 04-14-23, h/o breast cancer   PAIN:  Are you having pain? Yes: NPRS scale: 4/10 Pain location: Lt knee Pain description: throbbing Aggravating factors: prolonged sitting Relieving factors: Tylenol  and pain medication only if needed  PRECAUTIONS: None - no driving   RED FLAGS: None   WEIGHT BEARING RESTRICTIONS: No  FALLS: Has patient fallen in last 6 months? No  LIVING ENVIRONMENT: Lives with: lives alone Lives in: House/apartment Stairs: Yes: External: 5 steps; on right going up;  no steps at back door - using this entrance now Has following equipment at home: Single point cane, Walker - 2 wheeled, Marine scientist  PLOF: Independent with basic ADLs, Independent with household mobility without device, and Independent with community mobility with device  PATIENT GOALS: be able to get out in my yard and work in my flowers; walk without RW  OBJECTIVE:   Note: Objective measures were completed at Evaluation unless otherwise noted.  DIAGNOSTIC FINDINGS: N/A  COGNITION: Overall cognitive status: Within functional limits for tasks assessed   SENSATION: WFL  COORDINATION: WFL's RLE:  decreased speed of movements with LLE due to c/o  knee pain s/p TKA  POSTURE: No Significant postural limitations  LOWER EXTREMITY ROM:     Active  Right Eval Left Eval  Hip flexion    Hip extension    Hip abduction    Hip adduction    Hip internal rotation    Hip external rotation    Knee flexion  85 in seated, 80 degrees in supine; 90 degrees in seated at end of session  Knee extension  -35 seated, -24 supine  Ankle dorsiflexion    Ankle plantarflexion    Ankle inversion    Ankle eversion     (Blank rows = not tested)  LOWER EXTREMITY MMT:    MMT Right Eval Left Eval  Hip flexion    Hip extension    Hip abduction    Hip adduction    Hip internal rotation    Hip external rotation    Knee flexion    Knee extension  4 (c/o mild pain with resistance)  Ankle dorsiflexion    Ankle plantarflexion    Ankle inversion    Ankle eversion    (Blank rows = not tested)  BED MOBILITY:  Findings: modified independent  TRANSFERS: Sit to stand: Modified independence  Assistive device utilized: bil. UE support from chair      STAIRS:  TBA  GAIT: Findings: Gait Characteristics: step through pattern, decreased hip/knee flexion- Left, and antalgic, Distance walked: 60', Assistive device utilized:Walker - 2 wheeled, Level of assistance: Modified independence, and Comments: antalgic gait pattern due to s/p Lt TKA  FUNCTIONAL TESTS:  5 times sit to stand: TBA Timed up and go (TUG): 20.44 secs 10 meter walk test: 27.63 secs =  1.19 ft/sec with RW Pt stood for 1 without UE support by side of mat table - LLE started tremoring after standing for approx. 40 secs  TREATMENT DATE: 11/24/23  Therapeutic Activity:  Pt amb. With SPC modified independently in and out of clinic properly holding cane in RUE   At end of session R knee AROM:  Supine knee flexion: 104 degrees with over pressure  Sitting knee flexion: 115 degrees  Therapeutic Exercise:   Staggered stance sit <> stands with LLE posteriorly 10 reps, performing with no UE support  With 6 step, working on SLS on LLE, 2 x 10 reps step taps with RLE, UE support > fingertip support, verbal and tactile cues for quad activation in stance with LLE   In supine position: With LLE over bolster with 3# ankle weight 2 x 15 reps LAQs with 3 second hold, pt reporting RPE as 10/10  Heel slides 10 reps with PT providing overpressure into knee extension and flexion, pt with incr pain with this  10 reps bridging with PT helping keep legs in place, pt fatigues easily with this   PATIENT EDUCATION: Education details: Continue HEP with new additions (bolded below), educated to use cane at all times at home  Person educated: Patient Education method: Explanation, Demonstration, and Handouts Education comprehension: verbalized understanding and returned demonstration  HOME EXERCISE PROGRAM: Medbridge Medbridge HEP Access Code: [email or web address] URL: [email or web address] Date: 11/24/2023 Prepared by: Jonathan Neighbor  Exercises - Supine Straight Leg Raises  - 1 x daily - 7 x weekly - 3 sets - 10 reps - Supine Heel Slides  - 1 x daily - 7 x weekly - 3 sets - 10 reps - Seated Long Arc Quad  - 1 x daily - 7 x weekly - 3 sets - 10 reps - 3-5 sec hold - Supine Short Arc Quad  - 1 x daily - 7 x weekly - 3 sets - 10 reps - 3 sec hold - Side Leg Lifts  - 1 x daily - 7 x weekly - 3 sets - 10 reps - Seated Knee Flexion Extension AAROM with Overpressure  - 1 x daily - 7 x weekly - 3 sets - 10 reps  - Supine Bridge  - 1 x daily - 7 x weekly - 2 sets - 10 reps - Staggered  Sit-to-Stand  - 1 x daily - 7 x weekly - 2 sets - 10 reps - Step Taps on Low Step  - 1 x daily - 7 x weekly - 2 sets - 10 reps   Access Code: [email or web address] - Updated HEP - 2 stretches URL: [email or web address] Date: 11/19/2023 Prepared by: Johnnette Nakayama  Exercises - Standing Gastroc Stretch  - 1 x daily - 7 x weekly - 1 sets - 1-2 reps - 15-20 hold - Standing Bilateral Gastroc Stretch with Step  - 1 x daily - 7 x weekly - 1 sets - 2-3 reps - 20-30 hold  GOALS: Goals reviewed with patient? Yes  SHORT TERM GOALS: Target date: 11-26-23  Increase Lt knee active flexion to 95 degrees for increased mobility and ease of transfers, I.e. sit to stand and car transfers. Baseline: 90 degrees actively at end of eval session after performing exercises (5-22) Goal status: Goal met 11-11-23 (108 degrees actively)   2.  Increase Lt knee active extension to </= -18 degrees in supine position for reduced hamstring contracture and improved mobility and ROM of LLE. Baseline: -35 seated, -24 degrees in supine Goal status: INITIAL  3.  Pt will negotiate 4 steps with use of bil. Hand rails using step by step sequence with SBA.  Baseline: TBA Goal status: Goal  met 11-11-23  4.  Pt will improve TUG score to </= 16 secs with use of SPC to demonstrate improved functional mobility with reduced fall risk.   Baseline: 20.44 secs with RW Goal status: INITIAL  5.  Amb. 115' with SPC with CGA on flat, even surface for increased household accessibility with use of SPC. Baseline:  Goal status: INITIAL  6.  Independent in HEP for LLE strengthening, ROM, and balance. Baseline:  Goal status: INITIAL   LONG TERM GOALS: Target date: 12-24-23  Pt will ambulate 350' with SPC on even and uneven paved surfaces with supervision. Baseline: using RW Goal status: INITIAL  2.  Increase Lt knee active flexion to >/= 100 degrees in seated position for improved flexibility & mobility of LLE. Baseline: 90 degrees  actively at end of eval session after performing exercises (5-22) Goal status: INITIAL  3.  Increase Lt knee active extension to </= -15 degrees in supine position for reduced hamstring contracture and improved mobility and ROM of LLE. Baseline: -35 seated, -24 degrees in supine Goal status: INITIAL  4.  Improve TUG score to </= 14 secs with RW to demonstrate reduced fall risk and improved mobility. Baseline: 20.44 secs with RW Goal status: INITIAL  5.  Increase gait velocity to >/= 2.0 ft/sec with SPC for increased gait efficiency. Baseline: 1.19 ft/sec with RW Goal status: INITIAL  6.  Pt will report ability to return to working in her flowers with Lt knee discomfort </= 1/10 intensity. Baseline: 5/10 pain in Lt knee; pt is currently unable to work in her flowers Goal status: INITIAL  ASSESSMENT:  CLINICAL IMPRESSION:  Today's skilled session continued to focus on L knee AROM and strengthening tasks. Session limited due to pt arriving late and pt needing to leave right at the end of her appt time. Worked on SLS tolerance on LLE with pt performing step taps with UE support. Did not report incr pain with task, but just incr fatigue and difficulty. Pt challenged by bridging and for hip extensor/hamstring strengthening and knee flexion ROM. At end of session, pt demonstrating incr in knee flexion AROM in sitting and supine compared to previous session. Continued to encourage pt to perform HEP at home and added new exercises today to continue to work on strength/ROM.  Will continue per POC.     OBJECTIVE IMPAIRMENTS: decreased activity tolerance, decreased balance, difficulty walking, decreased ROM, decreased strength, and pain.   ACTIVITY LIMITATIONS: carrying, lifting, bending, standing, squatting, stairs, transfers, and locomotion level  PARTICIPATION LIMITATIONS: meal prep, cleaning, laundry, driving, shopping, community activity, and yard work  PERSONAL FACTORS: Age, Fitness, and 1  comorbidity: s/p Lt TKA are also affecting patient's functional outcome.   REHAB POTENTIAL: Good  CLINICAL DECISION MAKING: Evolving/moderate complexity  EVALUATION COMPLEXITY: Moderate  PLAN:  PT FREQUENCY: 2x/week  PT DURATION: 8 weeks + eval  PLANNED INTERVENTIONS: 97110-Therapeutic exercises, 97530- Therapeutic activity, [id number]- Neuromuscular re-education, 97535- Self Care, [id number]- Manual therapy, [id number]- Gait training, and [phone]- Aquatic Therapy  PLAN FOR NEXT SESSION:   continue Lt knee strengthening/ROM and gait training with SPC; balance training as tolerated to increase SLS on LLE  CHECK STGS AND 10TH VISIT PN!!!   Seabron Cypress, PT, DPT 11/24/2023, 4:17 PM

## 2023-11-30 ENCOUNTER — Ambulatory Visit: Payer: Self-pay | Admitting: Physical Therapy

## 2023-11-30 DIAGNOSIS — R2681 Unsteadiness on feet: Secondary | ICD-10-CM | POA: Diagnosis not present

## 2023-11-30 DIAGNOSIS — R262 Difficulty in walking, not elsewhere classified: Secondary | ICD-10-CM | POA: Diagnosis not present

## 2023-11-30 DIAGNOSIS — M6281 Muscle weakness (generalized): Secondary | ICD-10-CM

## 2023-11-30 DIAGNOSIS — R29898 Other symptoms and signs involving the musculoskeletal system: Secondary | ICD-10-CM | POA: Diagnosis not present

## 2023-11-30 NOTE — Therapy (Unsigned)
 OUTPATIENT PHYSICAL THERAPY NEURO TREATMENT NOTE/10TH VISIT PROGRESS NOTE   Progress Note Reporting Period 10-28-23 to 11-30-23  See note below for Objective Data and Assessment of Progress/Goals.  Thank you for the referral of this patient.    Patient Name: Alexis Kramer MRN: [id number] DOB:[birth year], 83 y.o., female Today's Date: 12/01/2023   PCP: Teresa Channel, MD REFERRING PROVIDER: Sheril Coy, MD  END OF SESSION:  PT End of Session - 12/01/23 1907     Visit Number 10    Number of Visits 17    Date for PT Re-Evaluation 12/24/23    Authorization Type [employer] Medicare    Authorization Time Period 10-28-23 - 01-06-24    PT Start Time 1317    PT Stop Time 1401    PT Time Calculation (min) 44 min    Equipment Utilized During Treatment Gait belt    Activity Tolerance Patient tolerated treatment well    Behavior During Therapy WFL for tasks assessed/performed                Past Medical History:  Diagnosis Date   Anxiety    Arthritis    both knees   Breast cancer (HCC)    Cancer (HCC) 1994   breast cancer ; left side   Depression    situational   GERD (gastroesophageal reflux disease)    Pre-diabetes    Past Surgical History:  Procedure Laterality Date   ABDOMINAL HYSTERECTOMY     APPENDECTOMY     CRANIOTOMY N/A 04/14/2023   Procedure: Endoscopic endonasal resection of pituitary tumor;  Surgeon: Cheryle Debby LABOR, MD;  Location: [facility] OR;  Service: Neurosurgery;  Laterality: N/A;   EYE SURGERY Bilateral 2021   MASTECTOMY Left 1997   TONSILLECTOMY     TOTAL KNEE ARTHROPLASTY Right 09/12/2019   Procedure: RIGHT TOTAL KNEE ARTHROPLASTY;  Surgeon: Sheril Coy, MD;  Location: [facility] ORS;  Service: Orthopedics;  Laterality: Right;   TOTAL KNEE ARTHROPLASTY Left 10/05/2023   Procedure: ARTHROPLASTY, KNEE, TOTAL;  Surgeon: Sheril Coy, MD;  Location: [facility] ORS;  Service: Orthopedics;  Laterality: Left;  LEFT TOTAL KNEE ARTHROPLASTY   TRANSNASAL APPROACH N/A  04/14/2023   Procedure: TRANSNASAL APPROACH;  Surgeon: Llewellyn Gerard LABOR, DO;  Location: [facility] OR;  Service: ENT;  Laterality: N/A;   Patient Active Problem List   Diagnosis Date Noted   Primary osteoarthritis of left knee 10/05/2023   Status post transsphenoidal pituitary resection (HCC) 04/14/2023   Pituitary adenoma (HCC) 04/14/2023   Primary osteoarthritis of right knee 09/12/2019    ONSET DATE: 10-05-23  REFERRING DIAG:    S/P L total knee replacement    THERAPY DIAG:  Other symptoms and signs involving the musculoskeletal system  Difficulty in walking, not elsewhere classified  Muscle weakness (generalized)  Rationale for Evaluation and Treatment: Rehabilitation  SUBJECTIVE:  SUBJECTIVE STATEMENT: Pt is doing well - walking in home about 50% time without SPC; feel ready for discharge this week  Pt accompanied by: self  PERTINENT HISTORY: s/p Rt TKA (April 2021);  Lt TKA 10-05-23:  s/p craniotomy pituitary adenoma 04-14-23, h/o breast cancer   PAIN:  Are you having pain? Yes: NPRS scale: 4/10 Pain location: Lt knee Pain description: throbbing Aggravating factors: prolonged sitting Relieving factors: Tylenol  and pain medication only if needed  PRECAUTIONS: None - no driving   RED FLAGS: None   WEIGHT BEARING RESTRICTIONS: No  FALLS: Has patient fallen in last 6 months? No  LIVING ENVIRONMENT: Lives with: lives alone Lives in: House/apartment Stairs: Yes: External: 5 steps; on right going up;  no steps at back door - using this entrance now Has following equipment at home: Single point cane, Walker - 2 wheeled, Marine scientist  PLOF: Independent with basic ADLs, Independent with household mobility without device, and Independent with community mobility with device  PATIENT  GOALS: be able to get out in my yard and work in my flowers; walk without RW  OBJECTIVE:  Note: Objective measures were completed at Evaluation unless otherwise noted.  DIAGNOSTIC FINDINGS: N/A  COGNITION: Overall cognitive status: Within functional limits for tasks assessed   SENSATION: WFL  COORDINATION: WFL's RLE:  decreased speed of movements with LLE due to c/o  knee pain s/p TKA  POSTURE: No Significant postural limitations  LOWER EXTREMITY ROM:     Active  Right Eval Left Eval  Hip flexion    Hip extension    Hip abduction    Hip adduction    Hip internal rotation    Hip external rotation    Knee flexion  85 in seated, 80 degrees in supine; 90 degrees in seated at end of session  Knee extension  -35 seated, -24 supine  Ankle dorsiflexion    Ankle plantarflexion    Ankle inversion    Ankle eversion     (Blank rows = not tested)  LOWER EXTREMITY MMT:    MMT Right Eval Left Eval  Hip flexion    Hip extension    Hip abduction    Hip adduction    Hip internal rotation    Hip external rotation    Knee flexion    Knee extension  4 (c/o mild pain with resistance)  Ankle dorsiflexion    Ankle plantarflexion    Ankle inversion    Ankle eversion    (Blank rows = not tested)  BED MOBILITY:  Findings: modified independent  TRANSFERS: Sit to stand: Modified independence  Assistive device utilized: bil. UE support from chair      STAIRS:  TBA  GAIT: Findings: Gait Characteristics: step through pattern, decreased hip/knee flexion- Left, and antalgic, Distance walked: 60', Assistive device utilized:Walker - 2 wheeled, Level of assistance: Modified independence, and Comments: antalgic gait pattern due to s/p Lt TKA  FUNCTIONAL TESTS:  5 times sit to stand: TBA Timed up and go (TUG): 20.44 secs 10 meter walk test: 27.63 secs =  1.19 ft/sec with RW Pt stood for 1 without UE support by side of mat table - LLE started tremoring after standing for approx. 40  secs  TREATMENT DATE: 11/30/23   Gait: Pt amb. With [facility] modified independently  Gait train with use of SPC in Rt hand 115' - cues for increased Lt heel contact at initial stance  Step training with use of bil. Hand rails - step over step sequence with ascension & descension - 3 reps for total of 12 steps negotiated - with use of bil. Rails with supervision   TherEx:  In seated position in chair:  pt performed active assisted Lt knee flexion using her RLE to push LLE into > knee flexion -- contract/relax with Lt knee held in flexed position - 10 reps with 3 sec hold  Lt knee active flexion = 106 degrees  Lt lower leg placed on stool - pt performed quad sets 10 reps with 3 sec hold - 3# weight placed on distal thigh for increased knee extension  In supine position: LLE -Lt heel slides 10 reps to increase Lt knee flexion - no weight -SLR 2# (placed above knee) 10 reps:  10 reps 2# weight positioned at ankle -Quad sets LLE 10 reps:  5# weight placed above knee to increase passive extension - small white bolster placed under pt's Lt lower leg SAQ's LLE 3# - 15 reps - used blue bolster LAQ's 3# 10 reps in seated position in chair  TherAct: Standing LLE closed chain strengthening/weight bearing activities inside // bars: Tap ups to 4 step performed with RLE with min. Bil. UE support on // bars; 10 reps - for increased LLE weight bearing Step ups onto 6 step with LLE for quad strengthening - with bil. UE support on bars- 10 reps Step down exercise - used 4 step - for Lt quad eccentric strengthening - 10 reps  Lt knee extension -24 degrees Lt knee flexion in seated position - 115 degrees - at end of session  PATIENT EDUCATION: Education details: Continue HEP with new additions (bolded below), educated to use cane at all times at home  Person educated:  Patient Education method: Explanation, Demonstration, and Handouts Education comprehension: verbalized understanding and returned demonstration  HOME EXERCISE PROGRAM: Medbridge Medbridge HEP Access Code: [email or web address] URL: [email or web address] Date: 11/24/2023 Prepared by: Sheffield Senate  Exercises - Supine Straight Leg Raises  - 1 x daily - 7 x weekly - 3 sets - 10 reps - Supine Heel Slides  - 1 x daily - 7 x weekly - 3 sets - 10 reps - Seated Long Arc Quad  - 1 x daily - 7 x weekly - 3 sets - 10 reps - 3-5 sec hold - Supine Short Arc Quad  - 1 x daily - 7 x weekly - 3 sets - 10 reps - 3 sec hold - Side Leg Lifts  - 1 x daily - 7 x weekly - 3 sets - 10 reps - Seated Knee Flexion Extension AAROM with Overpressure  - 1 x daily - 7 x weekly - 3 sets - 10 reps  - Supine Bridge  - 1 x daily - 7 x weekly - 2 sets - 10 reps - Staggered Sit-to-Stand  - 1 x daily - 7 x weekly - 2 sets - 10 reps - Step Taps on Low Step  - 1 x daily - 7 x weekly - 2 sets - 10 reps   Access Code: [email or web address] - Updated HEP - 2 stretches URL: [email or web address] Date: 11/19/2023 Prepared by: Rock Kussmaul  Exercises - Standing Gastroc Stretch  - 1 x daily - 7 x weekly - 1 sets -  1-2 reps - 15-20 hold - Standing Bilateral Gastroc Stretch with Step  - 1 x daily - 7 x weekly - 1 sets - 2-3 reps - 20-30 hold  FUNCTIONAL TESTS:  5 times sit to stand: TBA Timed up and go (TUG): 20.44 secs 10 meter walk test: 27.63 secs =  1.19 ft/sec with RW Pt stood for 1 without UE support by side of mat table - LLE started tremoring after standing for approx. 40 secs      Access Code: [email or web address] - U Access Code: [email or web address] URL: [email or web address] Date: 10/28/2023 Prepared by: Rock Kussmaul  Exercises - Supine Straight Leg Raises  - 1 x daily - 7 x weekly - 3 sets - 10 reps - Supine Heel Slides  - 1 x daily - 7 x weekly - 3 sets - 10 reps - Seated Long Arc Quad  - 1 x daily - 7 x  weekly - 3 sets - 10 reps - 3-5 sec hold - Supine Short Arc Quad  - 1 x daily - 7 x weekly - 3 sets - 10 reps - 3 sec hold - Side Leg Lifts  - 1 x daily - 7 x weekly - 3 sets - 10 reps - Seated Knee Flexion Extension AAROM with Overpressure  - 1 x daily - 7 x weekly - 3 sets - 10 reps  PATIENT EDUCATION: Education details: Medbridge HEP Person educated: Patient Education method: Programmer, multimedia, Demonstration, and Handouts Education comprehension: verbalized understanding and returned demonstration  HOME EXERCISE PROGRAM: Medbridge  GOALS: Goals reviewed with patient? Yes  SHORT TERM GOALS: Target date: 11-26-23  Increase Lt knee active flexion to 95 degrees for increased mobility and ease of transfers, I.e. sit to stand and car transfers. Baseline: 90 degrees actively at end of eval session after performing exercises (5-22) Goal status: Goal met 11-11-23 (108 degrees actively)   2.  Increase Lt knee active extension to </= -18 degrees in supine position for reduced hamstring contracture and improved mobility and ROM of LLE. Baseline: -35 seated, -24 degrees in supine;  -22 degrees on 11-30-23 Goal status: Goal not met 11-30-23  3.  Pt will negotiate 4 steps with use of bil. Hand rails using step by step sequence with SBA.  Baseline: TBA Goal status: Goal met 11-11-23  4.  Pt will improve TUG score to </= 16 secs with use of SPC to demonstrate improved functional mobility with reduced fall risk.   Baseline: 20.44 secs with RW Goal status:  In progress   5.  Amb. 115' with SPC with CGA on flat, even surface for increased household accessibility with use of SPC. Baseline:  Goal status: Goal met 11-30-23  6.  Independent in HEP for LLE strengthening, ROM, and balance. Baseline:  Goal status: Goal met 11-30-23   LONG TERM GOALS: Target date: 12-24-23  Pt will ambulate 350' with SPC on even and uneven paved surfaces with supervision. Baseline: using RW Goal status: INITIAL  2.   Increase Lt knee active flexion to >/= 100 degrees in seated position for improved flexibility & mobility of LLE. Baseline: 90 degrees actively at end of eval session after performing exercises (5-22) Goal status: INITIAL  3.  Increase Lt knee active extension to </= -15 degrees in supine position for reduced hamstring contracture and improved mobility and ROM of LLE. Baseline: -35 seated, -24 degrees in supine Goal status: INITIAL  4.  Improve TUG score to </= 14 secs with RW to demonstrate reduced fall risk  and improved mobility. Baseline: 20.44 secs with RW Goal status: INITIAL  5.  Increase gait velocity to >/= 2.0 ft/sec with SPC for increased gait efficiency. Baseline: 1.19 ft/sec with RW Goal status: INITIAL  6.  Pt will report ability to return to working in her flowers with Lt knee discomfort </= 1/10 intensity. Baseline: 5/10 pain in Lt knee; pt is currently unable to work in her flowers Goal status: INITIAL   ASSESSMENT:  CLINICAL IMPRESSION:  Today's 10th visit progress note covers dates 10-28-23 - 11-30-23. Pt has met STG's #1,3, 5 and 6:  STG #4 is ongoing and will be assessed next session.  STG #2 not met as Lt knee extension = -22 degrees (goal </= -18 degrees).  Pt reports she is pleased with progress and feels ready for discharge at next session.  Lt knee active flexion = 115 degrees and active extension -22 degrees.  Plan D/C next session.    OBJECTIVE IMPAIRMENTS: decreased activity tolerance, decreased balance, difficulty walking, decreased ROM, decreased strength, and pain.   ACTIVITY LIMITATIONS: carrying, lifting, bending, standing, squatting, stairs, transfers, and locomotion level  PARTICIPATION LIMITATIONS: meal prep, cleaning, laundry, driving, shopping, community activity, and yard work  PERSONAL FACTORS: Age, Fitness, and 1 comorbidity: s/p Lt TKA are also affecting patient's functional outcome.   REHAB POTENTIAL: Good  CLINICAL DECISION MAKING:  Evolving/moderate complexity  EVALUATION COMPLEXITY: Moderate  PLAN:  PT FREQUENCY: 2x/week  PT DURATION: 8 weeks + eval  PLANNED INTERVENTIONS: 97110-Therapeutic exercises, 97530- Therapeutic activity, [id number]- Neuromuscular re-education, 97535- Self Care, [id number]- Manual therapy, [phone]- Gait training, and [phone]- Aquatic Therapy  PLAN FOR NEXT SESSION:   continue Lt knee strengthening/ROM and gait training with SPC; balance training as tolerated to increase SLS on LLE  Capers Hagmann, [place], PT 12/01/2023, 7:09 PM

## 2023-12-01 ENCOUNTER — Encounter: Payer: Self-pay | Admitting: Physical Therapy

## 2023-12-02 ENCOUNTER — Ambulatory Visit: Payer: Self-pay | Admitting: Physical Therapy

## 2023-12-02 DIAGNOSIS — R29898 Other symptoms and signs involving the musculoskeletal system: Secondary | ICD-10-CM

## 2023-12-02 DIAGNOSIS — M6281 Muscle weakness (generalized): Secondary | ICD-10-CM

## 2023-12-02 DIAGNOSIS — R262 Difficulty in walking, not elsewhere classified: Secondary | ICD-10-CM | POA: Diagnosis not present

## 2023-12-02 DIAGNOSIS — R2681 Unsteadiness on feet: Secondary | ICD-10-CM | POA: Diagnosis not present

## 2023-12-02 NOTE — Therapy (Signed)
 OUTPATIENT PHYSICAL THERAPY NEURO TREATMENT NOTE/DISCHARGE SUMMARY       Patient Name: Alexis Kramer MRN: [id number] DOB:[birth year], 83 y.o., female Today's Date: 12/04/2023   PCP: Teresa Channel, MD REFERRING PROVIDER: Sheril Coy, MD  END OF SESSION:  PT End of Session - 12/04/23 1739     Visit Number 11    Number of Visits 17    Date for PT Re-Evaluation 12/24/23    Authorization Type [employer]    Authorization Time Period 10-28-23 - 01-06-24    PT Start Time 1314    PT Stop Time 1402    PT Time Calculation (min) 48 min    Equipment Utilized During Treatment Gait belt    Activity Tolerance Patient tolerated treatment well    Behavior During Therapy WFL for tasks assessed/performed                 Past Medical History:  Diagnosis Date   Anxiety    Arthritis    both knees   Breast cancer (HCC)    Cancer (HCC) 1994   breast cancer ; left side   Depression    situational   GERD (gastroesophageal reflux disease)    Pre-diabetes    Past Surgical History:  Procedure Laterality Date   ABDOMINAL HYSTERECTOMY     APPENDECTOMY     CRANIOTOMY N/A 04/14/2023   Procedure: Endoscopic endonasal resection of pituitary tumor;  Surgeon: Cheryle Debby LABOR, MD;  Location: [facility] OR;  Service: Neurosurgery;  Laterality: N/A;   EYE SURGERY Bilateral 2021   MASTECTOMY Left 1997   TONSILLECTOMY     TOTAL KNEE ARTHROPLASTY Right 09/12/2019   Procedure: RIGHT TOTAL KNEE ARTHROPLASTY;  Surgeon: Sheril Coy, MD;  Location: [facility] ORS;  Service: Orthopedics;  Laterality: Right;   TOTAL KNEE ARTHROPLASTY Left 10/05/2023   Procedure: ARTHROPLASTY, KNEE, TOTAL;  Surgeon: Sheril Coy, MD;  Location: [facility] ORS;  Service: Orthopedics;  Laterality: Left;  LEFT TOTAL KNEE ARTHROPLASTY   TRANSNASAL APPROACH N/A 04/14/2023   Procedure: TRANSNASAL APPROACH;  Surgeon: Llewellyn Gerard LABOR, DO;  Location: [facility] OR;  Service: ENT;  Laterality: N/A;   Patient Active Problem List    Diagnosis Date Noted   Primary osteoarthritis of left knee 10/05/2023   Status post transsphenoidal pituitary resection (HCC) 04/14/2023   Pituitary adenoma (HCC) 04/14/2023   Primary osteoarthritis of right knee 09/12/2019    ONSET DATE: 10-05-23  REFERRING DIAG:    S/P L total knee replacement    THERAPY DIAG:  Other symptoms and signs involving the musculoskeletal system  Difficulty in walking, not elsewhere classified  Muscle weakness (generalized)  Rationale for Evaluation and Treatment: Rehabilitation  SUBJECTIVE:  SUBJECTIVE STATEMENT: Pt is doing well - states she drove a short distance on Tuesday by herself for the first time since her knee surgery.  Pt states she feels ready for discharge today  Pt accompanied by: self  PERTINENT HISTORY: s/p Rt TKA (April 2021);  Lt TKA 10-05-23:  s/p craniotomy pituitary adenoma 04-14-23, h/o breast cancer   PAIN:  Are you having pain? Yes: NPRS scale: 4/10 Pain location: Lt knee Pain description: throbbing Aggravating factors: prolonged sitting Relieving factors: Tylenol  and pain medication only if needed  PRECAUTIONS: None - no driving   RED FLAGS: None   WEIGHT BEARING RESTRICTIONS: No  FALLS: Has patient fallen in last 6 months? No  LIVING ENVIRONMENT: Lives with: lives alone Lives in: House/apartment Stairs: Yes: External: 5 steps; on right going up;  no steps at back door - using this entrance now Has following equipment at home: Single point cane, Walker - 2 wheeled, Marine scientist  PLOF: Independent with basic ADLs, Independent with household mobility without device, and Independent with community mobility with device  PATIENT GOALS: be able to get out in my yard and work in my flowers; walk without RW  OBJECTIVE:  Note:  Objective measures were completed at Evaluation unless otherwise noted.  DIAGNOSTIC FINDINGS: N/A  COGNITION: Overall cognitive status: Within functional limits for tasks assessed   SENSATION: WFL  COORDINATION: WFL's RLE:  decreased speed of movements with LLE due to c/o  knee pain s/p TKA  POSTURE: No Significant postural limitations  LOWER EXTREMITY ROM:     Active  Right Eval Left Eval  Hip flexion    Hip extension    Hip abduction    Hip adduction    Hip internal rotation    Hip external rotation    Knee flexion  85 in seated, 80 degrees in supine; 90 degrees in seated at end of session  Knee extension  -35 seated, -24 supine  Ankle dorsiflexion    Ankle plantarflexion    Ankle inversion    Ankle eversion     (Blank rows = not tested)  LOWER EXTREMITY MMT:    MMT Right Eval Left Eval  Hip flexion    Hip extension    Hip abduction    Hip adduction    Hip internal rotation    Hip external rotation    Knee flexion    Knee extension  4 (c/o mild pain with resistance)  Ankle dorsiflexion    Ankle plantarflexion    Ankle inversion    Ankle eversion    (Blank rows = not tested)  BED MOBILITY:  Findings: modified independent  TRANSFERS: Sit to stand: Modified independence  Assistive device utilized: bil. UE support from chair      STAIRS:  TBA  GAIT: Findings: Gait Characteristics: step through pattern, decreased hip/knee flexion- Left, and antalgic, Distance walked: 60', Assistive device utilized:Walker - 2 wheeled, Level of assistance: Modified independence, and Comments: antalgic gait pattern due to s/p Lt TKA  FUNCTIONAL TESTS:  5 times sit to stand: TBA Timed up and go (TUG): 20.44 secs 10 meter walk test: 27.63 secs =  1.19 ft/sec with RW Pt stood for 1 without UE support by side of mat table - LLE started tremoring after standing for approx. 40 secs  TREATMENT DATE: 12/02/23   Gait: Pt amb. With [facility] modified independently Step training with use of bil. Hand rails - step over step sequence with ascension & descension - 3 reps for total of 12 steps negotiated - with use of bil. Rails with supervision Curb negotiation with use of SPC - 3 reps; cues for correct sequence; CGA on 1st rep, SBA on 2nd & 3rd reps  Gait velocity:  15.81 secs with SPC = 2.1 ft/sec,  TherEx:  In seated position in chair:  pt performed active assisted Lt knee flexion using her RLE to push LLE into > knee flexion -- contract/relax with Lt knee held in flexed position - 10 reps with 3 sec hold  Lt knee active flexion = 106 degrees  Lt lower leg placed on stool - pt performed quad sets 10 reps with 3 sec hold - 3# weight placed on distal thigh for increased knee extension  In supine position: LLE -Lt heel slides with 3# weight 10 reps to increase Lt knee flexion  -SLR 3# (placed above knee) 15 reps SAQ's LLE 3# - 20 reps - used blue bolster LAQ's 3# 20 reps in seated position on mat  TherAct: Standing LLE closed chain strengthening/weight bearing activities inside // bars: Tap ups to 6 step performed with RLE with min. Bil. UE support on // bars; 10 reps - for increased LLE weight bearing Step ups onto 6 step with LLE for quad strengthening - with bil. UE support on bars- 10 reps Step down exercise - used 4 step - for Lt quad eccentric strengthening - 10 reps  Lt knee extension -19 degrees Lt knee flexion in seated position - 110 degrees - at end of session        PATIENT EDUCATION: Education details: Continue HEP with new additions (bolded below), educated to use cane at all times at home  Person educated: Patient Education method: Explanation, Demonstration, and Handouts Education comprehension: verbalized understanding and returned demonstration  HOME EXERCISE PROGRAM: Medbridge Medbridge HEP Access Code:  [email or web address] URL: [email or web address] Date: 11/24/2023 Prepared by: Sheffield Senate  Exercises - Supine Straight Leg Raises  - 1 x daily - 7 x weekly - 3 sets - 10 reps - Supine Heel Slides  - 1 x daily - 7 x weekly - 3 sets - 10 reps - Seated Long Arc Quad  - 1 x daily - 7 x weekly - 3 sets - 10 reps - 3-5 sec hold - Supine Short Arc Quad  - 1 x daily - 7 x weekly - 3 sets - 10 reps - 3 sec hold - Side Leg Lifts  - 1 x daily - 7 x weekly - 3 sets - 10 reps - Seated Knee Flexion Extension AAROM with Overpressure  - 1 x daily - 7 x weekly - 3 sets - 10 reps  - Supine Bridge  - 1 x daily - 7 x weekly - 2 sets - 10 reps - Staggered Sit-to-Stand  - 1 x daily - 7 x weekly - 2 sets - 10 reps - Step Taps on Low Step  - 1 x daily - 7 x weekly - 2 sets - 10 reps   Access Code: [email or web address] - Updated HEP - 2 stretches URL: [email or web address] Date: 11/19/2023 Prepared by: Rock Kussmaul  Exercises - Standing Gastroc Stretch  - 1 x daily - 7 x weekly - 1 sets - 1-2 reps - 15-20 hold - Standing Bilateral Gastroc Stretch with Step  - 1  x daily - 7 x weekly - 1 sets - 2-3 reps - 20-30 hold  FUNCTIONAL TESTS:  5 times sit to stand: TBA Timed up and go (TUG): 20.44 secs 10 meter walk test: 27.63 secs =  1.19 ft/sec with RW Pt stood for 1 without UE support by side of mat table - LLE started tremoring after standing for approx. 40 secs      Access Code: [email or web address] - U Access Code: [email or web address] URL: [email or web address] Date: 10/28/2023 Prepared by: Rock Kussmaul  Exercises - Supine Straight Leg Raises  - 1 x daily - 7 x weekly - 3 sets - 10 reps - Supine Heel Slides  - 1 x daily - 7 x weekly - 3 sets - 10 reps - Seated Long Arc Quad  - 1 x daily - 7 x weekly - 3 sets - 10 reps - 3-5 sec hold - Supine Short Arc Quad  - 1 x daily - 7 x weekly - 3 sets - 10 reps - 3 sec hold - Side Leg Lifts  - 1 x daily - 7 x weekly - 3 sets - 10 reps - Seated Knee Flexion  Extension AAROM with Overpressure  - 1 x daily - 7 x weekly - 3 sets - 10 reps  PATIENT EDUCATION: Education details: Medbridge HEP Person educated: Patient Education method: Programmer, multimedia, Demonstration, and Handouts Education comprehension: verbalized understanding and returned demonstration  HOME EXERCISE PROGRAM: Medbridge  GOALS: Goals reviewed with patient? Yes  SHORT TERM GOALS: Target date: 11-26-23  Increase Lt knee active flexion to 95 degrees for increased mobility and ease of transfers, I.e. sit to stand and car transfers. Baseline: 90 degrees actively at end of eval session after performing exercises (5-22) Goal status: Goal met 11-11-23 (108 degrees actively)   2.  Increase Lt knee active extension to </= -18 degrees in supine position for reduced hamstring contracture and improved mobility and ROM of LLE. Baseline: -35 seated, -24 degrees in supine;  -22 degrees on 11-30-23 Goal status: Goal not met 11-30-23  3.  Pt will negotiate 4 steps with use of bil. Hand rails using step by step sequence with SBA.  Baseline: TBA Goal status: Goal met 11-11-23  4.  Pt will improve TUG score to </= 16 secs with use of SPC to demonstrate improved functional mobility with reduced fall risk.   Baseline: 20.44 secs with RW Goal status:  In progress   5.  Amb. 115' with SPC with CGA on flat, even surface for increased household accessibility with use of SPC. Baseline:  Goal status: Goal met 11-30-23  6.  Independent in HEP for LLE strengthening, ROM, and balance. Baseline:  Goal status: Goal met 11-30-23   LONG TERM GOALS: Target date: 12-24-23  Pt will ambulate 350' with SPC on even and uneven paved surfaces with supervision. Baseline: using RW; using SPC on 12-02-23 Goal status: Goal met 12-02-23  2.  Increase Lt knee active flexion to >/= 100 degrees in seated position for improved flexibility & mobility of LLE. Baseline: 90 degrees actively at end of eval session after performing  exercises (5-22); Lt knee active flexion 110 degrees - 12-02-23 Goal status: Goal met 12-02-23  3.  Increase Lt knee active extension to </= -15 degrees in supine position for reduced hamstring contracture and improved mobility and ROM of LLE. Baseline: -35 seated, -24 degrees in supine;  -19 degrees - 12-02-23 Goal status: Partially met 12-02-23  4.  Improve TUG score to </= 14  secs with RW to demonstrate reduced fall risk and improved mobility. Baseline: 20.44 secs with RW; 14.19 secs with SPC, 13.69 secs without SPC Goal status: Goal met 12-02-23  5.  Increase gait velocity to >/= 2.0 ft/sec with SPC for increased gait efficiency. Baseline: 1.19 ft/sec with RW; 15.81 secs with SPC = 2.1 ft/sec, 15.97 secs without SPC  Goal status: Goal met 12-02-23  6.  Pt will report ability to return to working in her flowers with Lt knee discomfort </= 1/10 intensity. Baseline: 5/10 pain in Lt knee; pt is currently unable to work in her flowers Goal status: Goal met 12-02-23   ASSESSMENT:  CLINICAL IMPRESSION:  Today's PT session focused on LTG assessment for discharge and also on Lt knee ROM, stretching and strengthening.  Pt has met LTG's #1,2, 4, 5 and 6:  LTG #3 partially met as Lt knee active extension has increased from -35 degrees to -19 degrees.  Goal not fully met as set for </= -15 degrees Lt knee extension.  Pt continues to have mild edema in Lt knee which is impacting ROM.  Pt has progressed from using RW with ambulation to using [facility] for assistance with community ambulation and pt reports amb. Approx. 50% time in home without use of SPC.  Pt is discharged due to goals met and completion of authorized visits from insurance.  Pt states she feels she is ready for D/C at this time and is pleased with her current functional status.      OBJECTIVE IMPAIRMENTS: decreased activity tolerance, decreased balance, difficulty walking, decreased ROM, decreased strength, and pain.   ACTIVITY LIMITATIONS:  carrying, lifting, bending, standing, squatting, stairs, transfers, and locomotion level  PARTICIPATION LIMITATIONS: meal prep, cleaning, laundry, driving, shopping, community activity, and yard work  PERSONAL FACTORS: Age, Fitness, and 1 comorbidity: s/p Lt TKA are also affecting patient's functional outcome.   REHAB POTENTIAL: Good  CLINICAL DECISION MAKING: Evolving/moderate complexity  EVALUATION COMPLEXITY: Moderate  PLAN:  PT FREQUENCY: 2x/week  PT DURATION: 8 weeks + eval  PLANNED INTERVENTIONS: 97110-Therapeutic exercises, 97530- Therapeutic activity, [id number]- Neuromuscular re-education, 97535- Self Care, [id number]- Manual therapy, [phone]- Gait training, and [phone]- Aquatic Therapy  PLAN FOR NEXT SESSION:  N/A -- D/C on 12-02-23    PHYSICAL THERAPY DISCHARGE SUMMARY  Visits from Start of Care: 11  Current functional level related to goals / functional outcomes: See above for progress towards goals    Remaining deficits: Continued dependency with ambulation with pt using SPC for community ambulation and using cane approx. 50% time with household amb.  Continued decreased Lt knee AROM - Lt knee extension = -19 degrees;  Lt knee flexion = 110 degrees (actively after stretching)   Education / Equipment: Pt has been instructed in HEP for Lt knee stretching & strengthening; pt reports compliance with this HEP   Patient agrees to discharge. Patient goals were met. Patient is being discharged due to meeting the stated rehab goals. Pt has completed all authorized visits through insurance - pt     Haide Klinker, [place], PT 12/04/2023, 5:42 PM

## 2023-12-04 ENCOUNTER — Encounter: Payer: Self-pay | Admitting: Physical Therapy

## 2023-12-06 DIAGNOSIS — R42 Dizziness and giddiness: Secondary | ICD-10-CM | POA: Diagnosis not present

## 2023-12-06 DIAGNOSIS — R634 Abnormal weight loss: Secondary | ICD-10-CM | POA: Diagnosis not present

## 2023-12-06 DIAGNOSIS — Z86018 Personal history of other benign neoplasm: Secondary | ICD-10-CM | POA: Diagnosis not present

## 2023-12-06 DIAGNOSIS — M1712 Unilateral primary osteoarthritis, left knee: Secondary | ICD-10-CM | POA: Diagnosis not present

## 2023-12-06 DIAGNOSIS — R5383 Other fatigue: Secondary | ICD-10-CM | POA: Diagnosis not present

## 2023-12-06 DIAGNOSIS — R030 Elevated blood-pressure reading, without diagnosis of hypertension: Secondary | ICD-10-CM | POA: Diagnosis not present

## 2023-12-06 DIAGNOSIS — F5101 Primary insomnia: Secondary | ICD-10-CM | POA: Diagnosis not present

## 2023-12-06 DIAGNOSIS — N182 Chronic kidney disease, stage 2 (mild): Secondary | ICD-10-CM | POA: Diagnosis not present

## 2023-12-06 DIAGNOSIS — E785 Hyperlipidemia, unspecified: Secondary | ICD-10-CM | POA: Diagnosis not present

## 2023-12-07 ENCOUNTER — Other Ambulatory Visit: Payer: Self-pay | Admitting: Family Medicine

## 2023-12-07 DIAGNOSIS — Z1231 Encounter for screening mammogram for malignant neoplasm of breast: Secondary | ICD-10-CM

## 2023-12-07 DIAGNOSIS — Z86018 Personal history of other benign neoplasm: Secondary | ICD-10-CM

## 2023-12-08 DIAGNOSIS — R634 Abnormal weight loss: Secondary | ICD-10-CM | POA: Diagnosis not present

## 2023-12-15 DIAGNOSIS — M25562 Pain in left knee: Secondary | ICD-10-CM | POA: Diagnosis not present

## 2023-12-16 ENCOUNTER — Other Ambulatory Visit

## 2023-12-22 DIAGNOSIS — I6782 Cerebral ischemia: Secondary | ICD-10-CM | POA: Diagnosis not present

## 2023-12-22 DIAGNOSIS — G939 Disorder of brain, unspecified: Secondary | ICD-10-CM | POA: Diagnosis not present

## 2023-12-22 DIAGNOSIS — D352 Benign neoplasm of pituitary gland: Secondary | ICD-10-CM | POA: Diagnosis not present

## 2023-12-24 DIAGNOSIS — D352 Benign neoplasm of pituitary gland: Secondary | ICD-10-CM | POA: Diagnosis not present

## 2024-01-03 DIAGNOSIS — H53462 Homonymous bilateral field defects, left side: Secondary | ICD-10-CM | POA: Diagnosis not present

## 2024-01-06 DIAGNOSIS — E785 Hyperlipidemia, unspecified: Secondary | ICD-10-CM | POA: Diagnosis not present

## 2024-01-06 DIAGNOSIS — N182 Chronic kidney disease, stage 2 (mild): Secondary | ICD-10-CM | POA: Diagnosis not present

## 2024-01-06 DIAGNOSIS — M1712 Unilateral primary osteoarthritis, left knee: Secondary | ICD-10-CM | POA: Diagnosis not present

## 2024-01-12 ENCOUNTER — Ambulatory Visit
Admission: RE | Admit: 2024-01-12 | Discharge: 2024-01-12 | Disposition: A | Discharge status: Home / Self Care | Source: Ambulatory Visit | Attending: Family Medicine | Admitting: Family Medicine

## 2024-01-12 DIAGNOSIS — Z1231 Encounter for screening mammogram for malignant neoplasm of breast: Secondary | ICD-10-CM

## 2024-01-19 DIAGNOSIS — R634 Abnormal weight loss: Secondary | ICD-10-CM | POA: Diagnosis not present

## 2024-01-19 DIAGNOSIS — F5101 Primary insomnia: Secondary | ICD-10-CM | POA: Diagnosis not present

## 2024-01-24 DIAGNOSIS — M25562 Pain in left knee: Secondary | ICD-10-CM | POA: Diagnosis not present

## 2024-01-26 DIAGNOSIS — M25662 Stiffness of left knee, not elsewhere classified: Secondary | ICD-10-CM | POA: Diagnosis not present

## 2024-01-26 DIAGNOSIS — R2689 Other abnormalities of gait and mobility: Secondary | ICD-10-CM | POA: Diagnosis not present

## 2024-01-26 DIAGNOSIS — M25562 Pain in left knee: Secondary | ICD-10-CM | POA: Diagnosis not present

## 2024-02-01 DIAGNOSIS — M25562 Pain in left knee: Secondary | ICD-10-CM | POA: Diagnosis not present

## 2024-02-01 DIAGNOSIS — Z96652 Presence of left artificial knee joint: Secondary | ICD-10-CM | POA: Diagnosis not present

## 2024-02-01 DIAGNOSIS — M25662 Stiffness of left knee, not elsewhere classified: Secondary | ICD-10-CM | POA: Diagnosis not present

## 2024-02-03 DIAGNOSIS — Z96652 Presence of left artificial knee joint: Secondary | ICD-10-CM | POA: Diagnosis not present

## 2024-02-03 DIAGNOSIS — M25662 Stiffness of left knee, not elsewhere classified: Secondary | ICD-10-CM | POA: Diagnosis not present

## 2024-02-03 DIAGNOSIS — R2689 Other abnormalities of gait and mobility: Secondary | ICD-10-CM | POA: Diagnosis not present

## 2024-02-03 DIAGNOSIS — M25562 Pain in left knee: Secondary | ICD-10-CM | POA: Diagnosis not present

## 2024-02-06 DIAGNOSIS — E785 Hyperlipidemia, unspecified: Secondary | ICD-10-CM | POA: Diagnosis not present

## 2024-02-06 DIAGNOSIS — N182 Chronic kidney disease, stage 2 (mild): Secondary | ICD-10-CM | POA: Diagnosis not present

## 2024-02-06 DIAGNOSIS — M1712 Unilateral primary osteoarthritis, left knee: Secondary | ICD-10-CM | POA: Diagnosis not present

## 2024-02-08 DIAGNOSIS — R2689 Other abnormalities of gait and mobility: Secondary | ICD-10-CM | POA: Diagnosis not present

## 2024-02-08 DIAGNOSIS — M25662 Stiffness of left knee, not elsewhere classified: Secondary | ICD-10-CM | POA: Diagnosis not present

## 2024-02-08 DIAGNOSIS — M25562 Pain in left knee: Secondary | ICD-10-CM | POA: Diagnosis not present

## 2024-02-08 DIAGNOSIS — Z96652 Presence of left artificial knee joint: Secondary | ICD-10-CM | POA: Diagnosis not present

## 2024-02-10 DIAGNOSIS — R2689 Other abnormalities of gait and mobility: Secondary | ICD-10-CM | POA: Diagnosis not present

## 2024-02-10 DIAGNOSIS — M25662 Stiffness of left knee, not elsewhere classified: Secondary | ICD-10-CM | POA: Diagnosis not present

## 2024-02-10 DIAGNOSIS — Z96652 Presence of left artificial knee joint: Secondary | ICD-10-CM | POA: Diagnosis not present

## 2024-02-10 DIAGNOSIS — M25562 Pain in left knee: Secondary | ICD-10-CM | POA: Diagnosis not present

## 2024-02-15 DIAGNOSIS — Z96652 Presence of left artificial knee joint: Secondary | ICD-10-CM | POA: Diagnosis not present

## 2024-02-15 DIAGNOSIS — M25662 Stiffness of left knee, not elsewhere classified: Secondary | ICD-10-CM | POA: Diagnosis not present

## 2024-02-15 DIAGNOSIS — M25562 Pain in left knee: Secondary | ICD-10-CM | POA: Diagnosis not present

## 2024-02-15 DIAGNOSIS — R2689 Other abnormalities of gait and mobility: Secondary | ICD-10-CM | POA: Diagnosis not present

## 2024-02-17 DIAGNOSIS — M25662 Stiffness of left knee, not elsewhere classified: Secondary | ICD-10-CM | POA: Diagnosis not present

## 2024-02-17 DIAGNOSIS — R2689 Other abnormalities of gait and mobility: Secondary | ICD-10-CM | POA: Diagnosis not present

## 2024-02-17 DIAGNOSIS — Z96652 Presence of left artificial knee joint: Secondary | ICD-10-CM | POA: Diagnosis not present

## 2024-02-17 DIAGNOSIS — M25562 Pain in left knee: Secondary | ICD-10-CM | POA: Diagnosis not present

## 2024-02-22 DIAGNOSIS — Z96652 Presence of left artificial knee joint: Secondary | ICD-10-CM | POA: Diagnosis not present

## 2024-02-22 DIAGNOSIS — R2689 Other abnormalities of gait and mobility: Secondary | ICD-10-CM | POA: Diagnosis not present

## 2024-02-22 DIAGNOSIS — M25662 Stiffness of left knee, not elsewhere classified: Secondary | ICD-10-CM | POA: Diagnosis not present

## 2024-02-22 DIAGNOSIS — M25562 Pain in left knee: Secondary | ICD-10-CM | POA: Diagnosis not present

## 2024-03-07 DIAGNOSIS — E785 Hyperlipidemia, unspecified: Secondary | ICD-10-CM | POA: Diagnosis not present

## 2024-03-07 DIAGNOSIS — N182 Chronic kidney disease, stage 2 (mild): Secondary | ICD-10-CM | POA: Diagnosis not present

## 2024-03-07 DIAGNOSIS — M1712 Unilateral primary osteoarthritis, left knee: Secondary | ICD-10-CM | POA: Diagnosis not present

## 2024-03-09 DIAGNOSIS — M25662 Stiffness of left knee, not elsewhere classified: Secondary | ICD-10-CM | POA: Diagnosis not present

## 2024-03-16 DIAGNOSIS — N182 Chronic kidney disease, stage 2 (mild): Secondary | ICD-10-CM | POA: Diagnosis not present

## 2024-03-16 DIAGNOSIS — E785 Hyperlipidemia, unspecified: Secondary | ICD-10-CM | POA: Diagnosis not present

## 2024-03-16 DIAGNOSIS — G629 Polyneuropathy, unspecified: Secondary | ICD-10-CM | POA: Diagnosis not present

## 2024-03-16 DIAGNOSIS — Z0000 Encounter for general adult medical examination without abnormal findings: Secondary | ICD-10-CM | POA: Diagnosis not present

## 2024-03-16 DIAGNOSIS — R7303 Prediabetes: Secondary | ICD-10-CM | POA: Diagnosis not present

## 2024-03-16 DIAGNOSIS — M8588 Other specified disorders of bone density and structure, other site: Secondary | ICD-10-CM | POA: Diagnosis not present

## 2024-03-16 DIAGNOSIS — Z853 Personal history of malignant neoplasm of breast: Secondary | ICD-10-CM | POA: Diagnosis not present

## 2024-03-16 DIAGNOSIS — H9193 Unspecified hearing loss, bilateral: Secondary | ICD-10-CM | POA: Diagnosis not present

## 2024-03-16 DIAGNOSIS — E559 Vitamin D deficiency, unspecified: Secondary | ICD-10-CM | POA: Diagnosis not present

## 2024-03-16 DIAGNOSIS — F5101 Primary insomnia: Secondary | ICD-10-CM | POA: Diagnosis not present

## 2024-03-18 ENCOUNTER — Other Ambulatory Visit (HOSPITAL_BASED_OUTPATIENT_CLINIC_OR_DEPARTMENT_OTHER): Payer: Self-pay | Admitting: Family Medicine

## 2024-03-18 DIAGNOSIS — M8580 Other specified disorders of bone density and structure, unspecified site: Secondary | ICD-10-CM

## 2024-10-19 ENCOUNTER — Other Ambulatory Visit (HOSPITAL_BASED_OUTPATIENT_CLINIC_OR_DEPARTMENT_OTHER)
